# Patient Record
Sex: Female | Born: 1935 | Race: White | Hispanic: No | Smoking: Never smoker
Health system: Southern US, Community
[De-identification: ages and names within clinical notes are randomized; demographics above are authoritative.]

## PROBLEM LIST (undated history)

## (undated) DIAGNOSIS — I4891 Unspecified atrial fibrillation: Secondary | ICD-10-CM

## (undated) DIAGNOSIS — I5032 Chronic diastolic (congestive) heart failure: Secondary | ICD-10-CM

## (undated) DIAGNOSIS — H269 Unspecified cataract: Secondary | ICD-10-CM

## (undated) HISTORY — DX: Chronic diastolic (congestive) heart failure: I50.32

## (undated) HISTORY — DX: Unspecified atrial fibrillation: I48.91

## (undated) HISTORY — DX: Unspecified cataract: H26.9

---

## 2001-03-18 ENCOUNTER — Encounter: Payer: Self-pay | Admitting: Emergency Medicine

## 2001-03-18 ENCOUNTER — Emergency Department (HOSPITAL_COMMUNITY): Admission: EM | Admit: 2001-03-18 | Discharge: 2001-03-18 | Payer: Self-pay | Admitting: Emergency Medicine

## 2001-03-23 ENCOUNTER — Encounter: Payer: Self-pay | Admitting: Urology

## 2001-03-25 ENCOUNTER — Ambulatory Visit (HOSPITAL_COMMUNITY): Admission: RE | Admit: 2001-03-25 | Discharge: 2001-03-25 | Payer: Self-pay | Admitting: Urology

## 2001-03-25 ENCOUNTER — Encounter: Payer: Self-pay | Admitting: Urology

## 2001-06-07 ENCOUNTER — Other Ambulatory Visit: Admission: RE | Admit: 2001-06-07 | Discharge: 2001-06-07 | Payer: Self-pay | Admitting: Obstetrics and Gynecology

## 2002-12-22 ENCOUNTER — Other Ambulatory Visit: Admission: RE | Admit: 2002-12-22 | Discharge: 2002-12-22 | Payer: Self-pay | Admitting: *Deleted

## 2003-04-07 ENCOUNTER — Encounter: Payer: Self-pay | Admitting: *Deleted

## 2003-04-07 ENCOUNTER — Encounter: Admission: RE | Admit: 2003-04-07 | Discharge: 2003-04-07 | Payer: Self-pay | Admitting: *Deleted

## 2003-04-10 ENCOUNTER — Ambulatory Visit (HOSPITAL_BASED_OUTPATIENT_CLINIC_OR_DEPARTMENT_OTHER): Admission: RE | Admit: 2003-04-10 | Discharge: 2003-04-10 | Payer: Self-pay | Admitting: *Deleted

## 2010-08-17 ENCOUNTER — Emergency Department (HOSPITAL_COMMUNITY)
Admission: EM | Admit: 2010-08-17 | Discharge: 2010-08-17 | Payer: Self-pay | Source: Home / Self Care | Admitting: Emergency Medicine

## 2010-08-17 LAB — DIFFERENTIAL
Basophils Absolute: 0 10*3/uL (ref 0.0–0.1)
Basophils Relative: 1 % (ref 0–1)
Eosinophils Absolute: 0 10*3/uL (ref 0.0–0.7)
Eosinophils Relative: 1 % (ref 0–5)
Lymphocytes Relative: 18 % (ref 12–46)
Lymphs Abs: 1.2 10*3/uL (ref 0.7–4.0)
Monocytes Absolute: 0.4 10*3/uL (ref 0.1–1.0)
Monocytes Relative: 5 % (ref 3–12)
Neutro Abs: 5 10*3/uL (ref 1.7–7.7)
Neutrophils Relative %: 75 % (ref 43–77)

## 2010-08-17 LAB — URINALYSIS, ROUTINE W REFLEX MICROSCOPIC
Hgb urine dipstick: NEGATIVE
Specific Gravity, Urine: 1.015 (ref 1.005–1.030)
Urine Glucose, Fasting: NEGATIVE mg/dL
pH: 6 (ref 5.0–8.0)

## 2010-08-17 LAB — CBC
HCT: 44.8 % (ref 36.0–46.0)
Hemoglobin: 15 g/dL (ref 12.0–15.0)
MCH: 29.1 pg (ref 26.0–34.0)
MCHC: 33.5 g/dL (ref 30.0–36.0)
MCV: 87 fL (ref 78.0–100.0)
Platelets: 228 10*3/uL (ref 150–400)
RBC: 5.15 MIL/uL — ABNORMAL HIGH (ref 3.87–5.11)
RDW: 13.3 % (ref 11.5–15.5)
WBC: 6.7 10*3/uL (ref 4.0–10.5)

## 2010-08-17 LAB — BASIC METABOLIC PANEL
CO2: 21 mEq/L (ref 19–32)
Chloride: 108 mEq/L (ref 96–112)
GFR calc Af Amer: >60 mL/min (ref >60)
Sodium: 139 mEq/L (ref 135–145)

## 2012-05-29 DIAGNOSIS — Z23 Encounter for immunization: Secondary | ICD-10-CM | POA: Diagnosis not present

## 2012-10-27 ENCOUNTER — Inpatient Hospital Stay (HOSPITAL_COMMUNITY)
Admission: EM | Admit: 2012-10-27 | Discharge: 2012-11-02 | DRG: 308 | Disposition: A | Discharge status: Home / Self Care | Payer: Medicare Other | Attending: Cardiology | Admitting: Cardiology

## 2012-10-27 ENCOUNTER — Encounter (HOSPITAL_COMMUNITY): Payer: Self-pay | Admitting: Emergency Medicine

## 2012-10-27 ENCOUNTER — Emergency Department (HOSPITAL_COMMUNITY): Payer: Medicare Other

## 2012-10-27 ENCOUNTER — Ambulatory Visit (INDEPENDENT_AMBULATORY_CARE_PROVIDER_SITE_OTHER): Payer: Medicare Other | Admitting: Family Medicine

## 2012-10-27 VITALS — BP 141/81 | HR 168 | Temp 98.4°F | Resp 16 | Ht <= 58 in | Wt 142.0 lb

## 2012-10-27 DIAGNOSIS — R7989 Other specified abnormal findings of blood chemistry: Secondary | ICD-10-CM

## 2012-10-27 DIAGNOSIS — R42 Dizziness and giddiness: Secondary | ICD-10-CM | POA: Diagnosis not present

## 2012-10-27 DIAGNOSIS — Z7901 Long term (current) use of anticoagulants: Secondary | ICD-10-CM | POA: Diagnosis not present

## 2012-10-27 DIAGNOSIS — R404 Transient alteration of awareness: Secondary | ICD-10-CM | POA: Diagnosis not present

## 2012-10-27 DIAGNOSIS — I5032 Chronic diastolic (congestive) heart failure: Secondary | ICD-10-CM

## 2012-10-27 DIAGNOSIS — I4891 Unspecified atrial fibrillation: Secondary | ICD-10-CM

## 2012-10-27 DIAGNOSIS — I5031 Acute diastolic (congestive) heart failure: Secondary | ICD-10-CM | POA: Diagnosis not present

## 2012-10-27 DIAGNOSIS — J90 Pleural effusion, not elsewhere classified: Secondary | ICD-10-CM | POA: Diagnosis not present

## 2012-10-27 DIAGNOSIS — I359 Nonrheumatic aortic valve disorder, unspecified: Secondary | ICD-10-CM | POA: Diagnosis not present

## 2012-10-27 DIAGNOSIS — R5381 Other malaise: Secondary | ICD-10-CM | POA: Diagnosis not present

## 2012-10-27 DIAGNOSIS — E876 Hypokalemia: Secondary | ICD-10-CM | POA: Diagnosis not present

## 2012-10-27 DIAGNOSIS — I509 Heart failure, unspecified: Secondary | ICD-10-CM | POA: Diagnosis not present

## 2012-10-27 DIAGNOSIS — R0989 Other specified symptoms and signs involving the circulatory and respiratory systems: Secondary | ICD-10-CM | POA: Diagnosis not present

## 2012-10-27 LAB — COMPREHENSIVE METABOLIC PANEL
AST: 24 U/L (ref 0–37)
BUN: 17 mg/dL (ref 6–23)
CO2: 24 mEq/L (ref 19–32)
Chloride: 103 mEq/L (ref 96–112)
Creatinine, Ser: 0.98 mg/dL (ref 0.50–1.10)
GFR calc non Af Amer: 50 mL/min — ABNORMAL LOW (ref >90)
Total Bilirubin: 1.1 mg/dL (ref 0.3–1.2)

## 2012-10-27 LAB — PROTIME-INR: INR: 1.03 (ref 0.00–1.49)

## 2012-10-27 LAB — CBC
HCT: 38.4 % (ref 36.0–46.0)
Hemoglobin: 12.6 g/dL (ref 12.0–15.0)
MCV: 85.5 fL (ref 78.0–100.0)
Platelets: 215 10*3/uL (ref 150–400)
RBC: 4.49 MIL/uL (ref 3.87–5.11)
WBC: 5.9 10*3/uL (ref 4.0–10.5)

## 2012-10-27 MED ORDER — DEXTROSE 5 % IV SOLN
5.0000 mg/h | INTRAVENOUS | Status: DC
  Administered 2012-10-28 (×2): 5 mg/h via INTRAVENOUS
  Administered 2012-10-29 – 2012-10-30 (×3): 10 mg/h via INTRAVENOUS
  Filled 2012-10-27 (×5): qty 100

## 2012-10-27 MED ORDER — HEPARIN (PORCINE) IN NACL 100-0.45 UNIT/ML-% IJ SOLN
700.0000 [IU]/h | INTRAMUSCULAR | Status: DC
  Administered 2012-10-27: 750 [IU]/h via INTRAVENOUS
  Filled 2012-10-27: qty 250

## 2012-10-27 MED ORDER — WARFARIN VIDEO: Freq: Once | Status: DC

## 2012-10-27 MED ORDER — SODIUM CHLORIDE 0.9 % IV SOLN: 250.0000 mL | INTRAVENOUS | Status: DC | PRN

## 2012-10-27 MED ORDER — SODIUM CHLORIDE 0.9 % IV SOLN
1000.0000 mL | INTRAVENOUS | Status: DC
  Administered 2012-10-27: 1000 mL via INTRAVENOUS

## 2012-10-27 MED ORDER — WARFARIN - PHARMACIST DOSING INPATIENT
Freq: Every day | Status: DC
  Administered 2012-10-28 – 2012-10-29 (×2)

## 2012-10-27 MED ORDER — MAGNESIUM SULFATE IN D5W 10-5 MG/ML-% IV SOLN
1.0000 g | Freq: Once | INTRAVENOUS | Status: AC
  Administered 2012-10-28: 1 g via INTRAVENOUS
  Filled 2012-10-27: qty 100

## 2012-10-27 MED ORDER — ONDANSETRON HCL 4 MG/2ML IJ SOLN: 4.0000 mg | Freq: Four times a day (QID) | INTRAMUSCULAR | Status: DC | PRN

## 2012-10-27 MED ORDER — DILTIAZEM HCL 100 MG IV SOLR
5.0000 mg/h | INTRAVENOUS | Status: DC
  Administered 2012-10-27: 5 mg/h via INTRAVENOUS

## 2012-10-27 MED ORDER — SODIUM CHLORIDE 0.9 % IJ SOLN
3.0000 mL | Freq: Two times a day (BID) | INTRAMUSCULAR | Status: DC
  Administered 2012-10-28 – 2012-10-29 (×3): 3 mL via INTRAVENOUS
  Administered 2012-10-30: 09:00:00 via INTRAVENOUS
  Administered 2012-10-30 – 2012-11-02 (×5): 3 mL via INTRAVENOUS

## 2012-10-27 MED ORDER — HEPARIN BOLUS VIA INFUSION
2500.0000 [IU] | Freq: Once | INTRAVENOUS | Status: AC
  Administered 2012-10-27: 2500 [IU] via INTRAVENOUS

## 2012-10-27 MED ORDER — ACETAMINOPHEN 325 MG PO TABS: 650.0000 mg | ORAL_TABLET | ORAL | Status: DC | PRN

## 2012-10-27 MED ORDER — SODIUM CHLORIDE 0.9 % IJ SOLN: 3.0000 mL | INTRAMUSCULAR | Status: DC | PRN

## 2012-10-27 MED ORDER — WARFARIN SODIUM 2.5 MG PO TABS
2.5000 mg | ORAL_TABLET | Freq: Once | ORAL | Status: AC
  Administered 2012-10-28: 2.5 mg via ORAL
  Filled 2012-10-27 (×2): qty 1

## 2012-10-27 MED ORDER — FUROSEMIDE 10 MG/ML IJ SOLN
40.0000 mg | Freq: Two times a day (BID) | INTRAMUSCULAR | Status: DC
  Administered 2012-10-28 (×3): 40 mg via INTRAVENOUS
  Filled 2012-10-27 (×6): qty 4

## 2012-10-27 MED ORDER — POTASSIUM CHLORIDE CRYS ER 20 MEQ PO TBCR
40.0000 meq | EXTENDED_RELEASE_TABLET | Freq: Every day | ORAL | Status: DC
  Administered 2012-10-28 – 2012-10-31 (×4): 40 meq via ORAL
  Filled 2012-10-27 (×5): qty 2

## 2012-10-27 MED ORDER — MAGNESIUM SULFATE 50 % IJ SOLN: 1.0000 g | Freq: Once | INTRAMUSCULAR | Status: DC

## 2012-10-27 MED ORDER — COUMADIN BOOK
Freq: Once | Status: AC
  Administered 2012-10-27: 22:00:00
  Filled 2012-10-27: qty 1

## 2012-10-27 MED ORDER — ASPIRIN 81 MG PO CHEW
324.0000 mg | CHEWABLE_TABLET | Freq: Once | ORAL | Status: AC
  Administered 2012-10-27: 324 mg via ORAL
  Filled 2012-10-27: qty 4

## 2012-10-27 NOTE — H&P (Signed)
Physician History and Physical    Melanie Blake MRN: [id number] DOB/AGE: 77-Jul-1925 77 y.o. Admit date: 10/27/2012   Primary Care Physician:   Primary Cardiologist: None  HPI:  79 yo with no significant past medical history and no home medications presented to the ER with atrial fibrillation/RVR.  Patient has had 5 days of progressive weakness and dyspnea.  She has had diarrhea/lose stool though not profuse.  She vomited only once (yesterday).  No fever/chills.  She has just been very tired.  She has noted dyspnea and fatigue with relatively minor exertion such as walking down to the street and doing chores around the house.  Her niece was concerned so took her to urgent care today.  There, her HR was noted to be in the 140s in atrial fibrillation.  She was sent to the ER where diltiazem gtt was started.  HR decreased to the 100s on diltiazem gtt.  Of note, she has not felt tachypalpitations.  No chest pain.  She has been somewhat lightheaded the last few days though her BP was ok.  She lives by herself in a townhouse though her niece is near.  CXR looks like CHF.   PMH: 1. Cataracts 2. Atrial fibrillation: Newly noted.  Review of systems complete and found to be negative unless listed above   Family History:  No significant cardiac disease that she remembers.   History   Social History  . Marital Status: Single    Spouse Name: N/A    Number of Children: N/A  . Years of Education: N/A   Occupational History  . Not on file.   Social History Main Topics  . Smoking status: Never Smoker   . Smokeless tobacco: Not on file  . Alcohol Use: No  . Drug Use: No  . Sexually Active: No   Other Topics Concern  . Lives alone in a townhouse, niece lives nearby.    Social History Narrative  . No narrative on file    No home medications  Physical Exam: Blood pressure 121/89, pulse 91, temperature 98.7 F (37.1 C), temperature source Oral, resp. rate 20, SpO2 96.00%.  General:  NAD Neck: JVP 9-10 cm, no thyromegaly or thyroid nodule.  Lungs: Crackles at bases bilaterally CV: Nondisplaced PMI.  Heart irregular S1/S2, no S3/S4, no murmur. 1-2+ edema to knees bilaterally.  No carotid bruit.    Abdomen: Soft, nontender, no hepatosplenomegaly, no distention.  Skin: Intact without lesions or rashes.  Neurologic: Alert and oriented x 3.  Psych: Normal affect. Extremities: No clubbing or cyanosis.  HEENT: Normal.   Labs:   Lab Results  Component Value Date   WBC 5.9 10/27/2012   HGB 12.6 10/27/2012   HCT 38.4 10/27/2012   MCV 85.5 10/27/2012   PLT 215 10/27/2012    Recent Labs Lab 10/27/12 1947  NA 136  K 4.0  CL 103  CO2 24  BUN 17  CREATININE 0.98  CALCIUM 9.4  PROT 7.0  BILITOT 1.1  ALKPHOS 73  ALT 25  AST 24  GLUCOSE 96   Radiology: - CXR: Pulmonary vascular congestion  EKG: atrial fibrillation with RVR at 156  ASSESSMENT AND PLAN:  79 yo with no significant past medical history and no home medications presented to the ER with atrial fibrillation/RVR. 1. Atrial fibrillation: New onset atrial fibrillation with RVR, suspect this has perhaps been present over the last 4-5 days given her symptoms.  She may have started out with a viral gastroenteritis episode  though exam and labs do not suggest dehydration and the diarrhea seems to have resolved at this point.  - Control atrial fibrillation rate => diltiazem gtt titrated as needed.  - Will give 1 g MgSO4.   - Check TSH - Will start heparin/coumadin overlap.  If she remains in atrial fibrillation tomorrow, given CHF suspected to be related to the atrial fibrillation, would favor early TEE-guided DCCV.  She would then need to continue coumadin for a month, at which time she could switch over to Xarelto or apixaban.  - I would favor anticoagulation given CHADSVASC = 3 (age, gender).  She is steady on her feet with no fall or GI bleed history.  2. CHF: Acute CHF with volume overload on exam and CXR.  She has  had exertional dyspnea.  I will start lasix 40 mg IV bid for now and follow response.  She will need an echocardiogram.   Signed: Marca Ancona 10/27/2012, 9:23 PM

## 2012-10-27 NOTE — Progress Notes (Signed)
IV insertion attempt x 1 unsuccessful. Patient tolerated well.  

## 2012-10-27 NOTE — ED Provider Notes (Signed)
History    CSN: [id number] Arrival date & time 10/27/12  1906 First MD Initiated Contact with Patient 10/27/12 1907      Chief Complaint  Patient presents with  . Atrial Fibrillation    HPI The patient presents to emergency room with new onset atrial fibrillation. Patient had noticed that she been having some generalized weakness over the last week or 2. Initially she had had a few episodes of diarrhea and had attributed her symptoms to a viral illness that she was recovering from. The last day or so she noticed that when she went to walk down the stairs at her town home that she started to get short of breath, lightheaded and weak. The patient went to an urgent care today. While she was there they noted that she was in rapid atrial fibrillation patient denies any chest pain or shortness of breath. She has no prior history of this illness.   Past Medical History  Diagnosis Date  . Cataract     History reviewed. No pertinent past surgical history.  No family history on file.  History  Substance Use Topics  . Smoking status: Never Smoker   . Smokeless tobacco: Not on file  . Alcohol Use: No    OB History   Grav Para Term Preterm Abortions TAB SAB Ect Mult Living                  Review of Systems  All other systems reviewed and are negative.    Allergies  Penicillins  Home Medications  No current outpatient prescriptions on file.  BP 129/83  Temp(Src) 98.7 F (37.1 C) (Oral)  Resp 20  SpO2 95%  Physical Exam  Nursing note and vitals reviewed. Constitutional: No distress.  HENT:  Head: Normocephalic and atraumatic.  Right Ear: External ear normal.  Left Ear: External ear normal.  Eyes: Conjunctivae are normal. Right eye exhibits no discharge. Left eye exhibits no discharge. No scleral icterus.  Neck: Neck supple. No tracheal deviation present.  Cardiovascular: Intact distal pulses.  An irregularly irregular rhythm present. Tachycardia present.    Pulmonary/Chest: Effort normal and breath sounds normal. No stridor. No respiratory distress. She has no wheezes. She has no rales.  Abdominal: Soft. Bowel sounds are normal. She exhibits no distension. There is no tenderness. There is no rebound and no guarding.  Musculoskeletal: She exhibits no edema and no tenderness.  Kyphosis  Neurological: She is alert. She has normal strength. No sensory deficit. Cranial nerve deficit:  no gross defecits noted. She exhibits normal muscle tone. She displays no seizure activity. Coordination normal.  Skin: Skin is warm and dry. No rash noted.  Psychiatric: She has a normal mood and affect.    ED Course  Procedures (including critical care time) EKG Atrial fibrillation with rapid ventricular rate, rate 156 Right axis deviation Borderline T-wave abnormalities No prior EKG for comparison  Medications  0.9 %  sodium chloride infusion (1,000 mLs Intravenous New Bag/Given 10/27/12 2001)  diltiazem (CARDIZEM) 100 mg in dextrose 5 % 100 mL infusion (5 mg/hr Intravenous New Bag/Given 10/27/12 2002)  aspirin chewable tablet 324 mg (324 mg Oral Given 10/27/12 2001)   8:39 PM vital signs are much improved heart rate is now in the 90s  CRITICAL CARE Performed by: Linwood Dibbles R Total critical care time: 30 Critical care time was exclusive of separately billable procedures and treating other patients. Critical care was necessary to treat or prevent imminent or life-threatening deterioration. Critical care was  time spent personally by me on the following activities: development of treatment plan with patient and/or surrogate as well as nursing, discussions with consultants, evaluation of patient's response to treatment, examination of patient, obtaining history from patient or surrogate, ordering and performing treatments and interventions, ordering and review of laboratory studies, ordering and review of radiographic studies, pulse oximetry and re-evaluation of  patient's condition.  Labs Reviewed  COMPREHENSIVE METABOLIC PANEL - Abnormal; Notable for the following:    GFR calc non Af Amer 50 (*)    GFR calc Af Amer 58 (*)    All other components within normal limits  CBC  PROTIME-INR  APTT  POCT I-STAT TROPONIN I   Dg Chest Portable 1 View  10/27/2012  *RADIOLOGY REPORT*  Clinical Data: Atrial fibrillation, dizziness  PORTABLE CHEST - 1 VIEW  Comparison: None.  Findings: Bilateral layering pleural effusions with associated bibasilar opacities. Moderate pulmonary vascular congestion. Cardiopulmonary silhouette is partially obscured by the bibasilar processes but appears enlarged.  Atherosclerotic calcification noted in the transverse aorta.  No acute osseous abnormality.  IMPRESSION:  Cardiomegaly, pulmonary vascular congestion and bilateral layering effusions suggest underlying CHF.  Bibasilar opacities favored to reflect pleural fluid with atelectasis.  Superimposed consolidation or mass lesion is difficult to exclude on this single view.   Original Report Authenticated By: Malachy Moan, M.D.      1. Atrial fibrillation with rapid ventricular response       MDM  The patient presents with new onset atrial fibrillation with a rapid response. She has responded well to a Cardizem infusion. Patient does appear to have a component of congestive heart failure likely related to the one week of atrial fibrillation.  Consult cardiology service regarding admission for further evaluation and treatment        Celene Kras, MD 10/27/12 2045

## 2012-10-27 NOTE — ED Notes (Signed)
Pt to ED via [facility] from a urgent care with c/o new onset A-Fib.  Pt went to urgent care with c/o diarrhea and vomited x's 1 today.  Pt denies any chest pain, shortness of breath or diaphoresis.

## 2012-10-27 NOTE — Progress Notes (Signed)
ANTICOAGULATION CONSULT NOTE - Initial Consult  Pharmacy Consult for UFH/Coumadin Indication: atrial fibrillation  Allergies  Allergen Reactions  . Penicillins Swelling    Patient Measurements: Height: 4' 9.09" (145 cm) Weight: 141 lb 15.6 oz (64.4 kg) IBW/kg (Calculated) : 38.8 Heparin Dosing Weight: 54kg  Vital Signs: Temp: 98.7 F (37.1 C) (04/09 1920) Temp src: Oral (04/09 1920) BP: 121/89 mmHg (04/09 2100) Pulse Rate: 91 (04/09 2100)  Labs:  Recent Labs  10/27/12 1947  HGB 12.6  HCT 38.4  PLT 215  APTT 31  LABPROT 13.4  INR 1.03  CREATININE 0.98    Estimated Creatinine Clearance: 31.3 ml/min (by C-G formula based on Cr of 0.98).   Medical History: Past Medical History  Diagnosis Date  . Cataract     Medications:   (Not in a hospital admission)  Assessment: 79 y/o female patient admitted with new onset afib requiring anticoagulation.  Goal of Therapy:  INR 2-3 Heparin level 0.3-0.7 units/ml Monitor platelets by anticoagulation protocol: Yes   Plan:  Heparin 2500 unit IV bolus followed by infusion at 750 units/hr, coumadin 2.5mg  today, check heparin level in 8 hours, with daily cbc , heparin level and protime.  Verlene Mayer, PharmD, BCPS Pager [phone] 10/27/2012,10:08 PM

## 2012-10-27 NOTE — Progress Notes (Signed)
  Subjective:    Patient ID: Melanie Blake, female    DOB: [birth year], 79 y.o.   MRN: [id number]  Emesis  Associated symptoms include diarrhea, dizziness and headaches. Pertinent negatives include no abdominal pain, chest pain, chills, coughing or fever.  Diarrhea  Associated symptoms include headaches and vomiting. Pertinent negatives include no abdominal pain, chills, coughing or fever.  Extremity Weakness  Pertinent negatives include no fever.    Melanie Blake is a delightful 79 yo with no sig PMHx who has had diarrhea for the past 5d with progressive weakness and lightheadedness. Today she developed vomiting and her niece noticed that she seemed to be breathing shallow and fast so brought her in for further eval.  Melanie Blake has no cardiac history, takes no medications - including no otc meds or supplements - "not even an aspirin."  She has not felt up to eating and drinking much over the past few days. Yesterday, she felt so weak, she couldn't climb the stairs back to her apt after she took out her trash. No f/c. No CP or palp.  Past Medical History  Diagnosis Date  . Cataract    No current outpatient prescriptions on file prior to visit.   No current facility-administered medications on file prior to visit.   Allergies  Allergen Reactions  . Penicillins Swelling    Review of Systems  Constitutional: Positive for activity change, appetite change and fatigue. Negative for fever, chills, diaphoresis and unexpected weight change.  Respiratory: Positive for shortness of breath. Negative for cough.   Cardiovascular: Positive for leg swelling. Negative for chest pain and palpitations.  Gastrointestinal: Positive for nausea, vomiting, diarrhea and abdominal distention. Negative for abdominal pain, constipation and blood in stool.  Genitourinary: Negative for dysuria, decreased urine volume and difficulty urinating.  Musculoskeletal: Positive for extremity weakness. Negative for gait  problem.  Skin: Positive for pallor. Negative for rash and wound.  Neurological: Positive for dizziness, weakness, light-headedness and headaches.  Hematological: Negative for adenopathy. Bruises/bleeds easily.  Psychiatric/Behavioral: The patient is nervous/anxious.        BP 141/81  Pulse 168  Temp(Src) 98.4 F (36.9 C) (Oral)  Resp 16  Ht 4\' 9"  (1.448 m)  Wt 142 lb (64.411 kg)  BMI 30.72 kg/m2  SpO2 93% Objective:   Physical Exam  Constitutional: She is oriented to person, place, and time. She appears well-developed and well-nourished. She appears listless. She does not appear ill. No distress.  HENT:  Head: Normocephalic and atraumatic.  Right Ear: External ear normal.  Left Ear: External ear normal.  Eyes: Conjunctivae are normal. No scleral icterus.  Neck: Neck supple.  Cardiovascular: An irregularly irregular rhythm present. Tachycardia present.  Exam reveals decreased pulses.   Pulmonary/Chest: Effort normal. No respiratory distress. She has decreased breath sounds.  Musculoskeletal: She exhibits edema.  Neurological: She is oriented to person, place, and time. She appears listless.  Skin: Skin is warm and dry. She is not diaphoretic. No erythema.  Psychiatric: She has a normal mood and affect. Her behavior is normal.     [facility] reading (PRIMARY) by  Dr. Clelia Croft. EKG: A. Fib w/ rate of 160s  Assessment & Plan:  New onset atrial fibrillation and dehydration - IV placed by [facility] PA and transfer by EMS to [facility] for anticoag, rate control, hydration, and further eval.  Will need cardiology referral.

## 2012-10-28 DIAGNOSIS — I4891 Unspecified atrial fibrillation: Secondary | ICD-10-CM | POA: Diagnosis not present

## 2012-10-28 DIAGNOSIS — I359 Nonrheumatic aortic valve disorder, unspecified: Secondary | ICD-10-CM

## 2012-10-28 DIAGNOSIS — R7989 Other specified abnormal findings of blood chemistry: Secondary | ICD-10-CM

## 2012-10-28 DIAGNOSIS — I5032 Chronic diastolic (congestive) heart failure: Secondary | ICD-10-CM

## 2012-10-28 LAB — TROPONIN I
Troponin I: <0.3 ng/mL (ref <0.30)
Troponin I: <0.3 ng/mL (ref <0.30)
Troponin I: <0.3 ng/mL (ref <0.30)

## 2012-10-28 LAB — CBC
HCT: 39.2 % (ref 36.0–46.0)
Hemoglobin: 13 g/dL (ref 12.0–15.0)
WBC: 6.8 10*3/uL (ref 4.0–10.5)

## 2012-10-28 LAB — BASIC METABOLIC PANEL
CO2: 25 mEq/L (ref 19–32)
GFR calc non Af Amer: 50 mL/min — ABNORMAL LOW (ref >90)
Glucose, Bld: 96 mg/dL (ref 70–99)
Potassium: 3.9 mEq/L (ref 3.5–5.1)
Sodium: 138 mEq/L (ref 135–145)

## 2012-10-28 LAB — PROTIME-INR
INR: 1.07 (ref 0.00–1.49)
Prothrombin Time: 13.8 seconds (ref 11.6–15.2)

## 2012-10-28 LAB — MRSA PCR SCREENING: MRSA by PCR: NEGATIVE

## 2012-10-28 LAB — TSH: TSH: 7.811 u[IU]/mL — ABNORMAL HIGH (ref 0.350–4.500)

## 2012-10-28 MED ORDER — SODIUM CHLORIDE 0.9 % IV SOLN
INTRAVENOUS | Status: DC
  Administered 2012-11-01: 250 mL via INTRAVENOUS

## 2012-10-28 MED ORDER — WARFARIN SODIUM 2.5 MG PO TABS
2.5000 mg | ORAL_TABLET | Freq: Once | ORAL | Status: AC
  Administered 2012-10-28: 2.5 mg via ORAL
  Filled 2012-10-28: qty 1

## 2012-10-28 MED ORDER — HEPARIN (PORCINE) IN NACL 100-0.45 UNIT/ML-% IJ SOLN
550.0000 [IU]/h | INTRAMUSCULAR | Status: DC
  Administered 2012-10-29: 550 [IU]/h via INTRAVENOUS
  Filled 2012-10-28 (×6): qty 250

## 2012-10-28 NOTE — Progress Notes (Signed)
ANTICOAGULATION CONSULT NOTE - Follow Up Consult  Pharmacy Consult for heparin Indication: atrial fibrillation  Labs:  Recent Labs  10/27/12 1947 10/27/12 2351 10/28/12 0700  HGB 12.6  --  13.0  HCT 38.4  --  39.2  PLT 215  --  212  APTT 31  --   --   LABPROT 13.4  --  13.8  INR 1.03  --  1.07  HEPARINUNFRC  --   --  0.82*  CREATININE 0.98  --  0.98  TROPONINI  --  <0.30 <0.30    Assessment: 79yo female slightly supratherapeutic on heparin with initial dosing for Afib.  Goal of Therapy:  Heparin level 0.3-0.7 units/ml   Plan:  Will decrease heparin gtt by ~1 unit/kg/hr to 700 units/hr and check level in 8hr.  Vernard Gambles, PharmD, BCPS  10/28/2012,8:06 AM

## 2012-10-28 NOTE — Progress Notes (Signed)
Patient: Melanie Blake / Admit Date: 10/27/2012 / Date of Encounter: 10/28/2012, 7:09 AM   Subjective  79 yo female admitted yesterday for A. Fib with RVR. No complaints overnight. Feels SOB and lightheadedness have improved. Denies palpitations, CP. No episodes of n/v/d.    Objective   Telemetry: Afib 80-122 Physical Exam: Filed Vitals:   10/28/12 0400  BP: 122/99  Pulse: 81  Temp: 97.6 F (36.4 C)  Resp: 19   General: Well developed, well nourished, in no acute distress. Head: Normocephalic, atraumatic, sclera non-icteric, no xanthomas, nares are without discharge. Neck: Negative for carotid bruits. JVD mildly elevated. Lungs: Fine crackles heard throughout most notably in RML, RLL and LLL. Breathing is unlabored. Heart: Irregular, S1 S2 without murmurs, rubs, or gallops.  Abdomen: Soft, non-tender, non-distended with normoactive bowel sounds. No hepatomegaly. No rebound/guarding. No obvious abdominal masses. Msk:  Strength and tone appear normal for age. Extremities: No clubbing or cyanosis. Tr edema to BLEs.  Distal pedal pulses are 1+ and equal bilaterally. Scattered ecchymosis upper extremities which pt states were from the lab sticks (hard stick - has IV in hand) Neuro: Alert and oriented X 3. Moves all extremities spontaneously. Psych:  Responds to questions appropriately with a normal affect.   No intake or output data in the 24 hours ending 10/28/12 0709  Inpatient Medications:  . furosemide  40 mg Intravenous BID  . potassium chloride  40 mEq Oral Daily  . sodium chloride  3 mL Intravenous Q12H  . warfarin   Does not apply Once  . Warfarin - Pharmacist Dosing Inpatient   Does not apply q1800    Labs:  Recent Labs  10/27/12 1947  NA 136  K 4.0  CL 103  CO2 24  GLUCOSE 96  BUN 17  CREATININE 0.98  CALCIUM 9.4    Recent Labs  10/27/12 1947  AST 24  ALT 25  ALKPHOS 73  BILITOT 1.1  PROT 7.0  ALBUMIN 4.0    Recent Labs  10/27/12 1947  WBC  5.9  HGB 12.6  HCT 38.4  MCV 85.5  PLT 215    Recent Labs  10/27/12 2351  TROPONINI <0.30   Radiology/Studies:  Dg Chest Portable 1 View 10/27/2012  *RADIOLOGY REPORT*  Clinical Data: Atrial fibrillation, dizziness  PORTABLE CHEST - 1 VIEW  Comparison: None.  Findings: Bilateral layering pleural effusions with associated bibasilar opacities. Moderate pulmonary vascular congestion. Cardiopulmonary silhouette is partially obscured by the bibasilar processes but appears enlarged.  Atherosclerotic calcification noted in the transverse aorta.  No acute osseous abnormality.  IMPRESSION:  Cardiomegaly, pulmonary vascular congestion and bilateral layering effusions suggest underlying CHF.  Bibasilar opacities favored to reflect pleural fluid with atelectasis.  Superimposed consolidation or mass lesion is difficult to exclude on this single view.   Original Report Authenticated By: Malachy Moan, M.D.      Assessment and Plan  1. A. Fib: Remains on Dilt gtt at 5mg /hr with adequate HR control. Continue heparin gtt and bridge to long term Coumadin as her CHADSVASc score is 4 (age>75, female, CHF). Concern was stated regarding frequency of lab draws with Coumadin therapy-she would benefit from home health for both CHF and INR draws (can arrange at discharge, CM consult ordered). As she remains in A.fib, consider TEE/DCCV today, she has been kept NPO. Follow up TSH and Magnesium levels.   2. CHF: Suspect this is related to new onset A.Fib. BNP elevated yesterday to 1532. Crackles remain present upon exam. Down 3lbs  today, continue 40mg  Lasix BID and follow potassium levels . ECHO today.   Signed, Ronie Spies PA-C      Patient examined and agree except changes made. Echo pending but suspect normal LV function.  Valera Castle, MD 10/28/2012 10:49 AM

## 2012-10-28 NOTE — Progress Notes (Addendum)
D/w Dr. Daleen Squibb. No room on schedule for TEE/DCCV today. Will allow her to diurese some, keep on IV dilt, ok to eat, and make NPO in AM for possible TEE/DCCV tomorrow if still in afib.  Addendum: Trish aware to scheduled procedure, tentatively on schedule for [facility]. Vita Currin PA-C

## 2012-10-28 NOTE — Progress Notes (Signed)
  Echocardiogram 2D Echocardiogram has been performed.  Melanie Blake 10/28/2012, 11:12 AM

## 2012-10-28 NOTE — Progress Notes (Signed)
ANTICOAGULATION CONSULT NOTE - Follow Up Consult  Pharmacy Consult for warfarin Indication: atrial fibrillation  Allergies  Allergen Reactions  . Penicillins Swelling    Patient Measurements: Height: 5' (152.4 cm) Weight: 142 lb 13.7 oz (64.8 kg) IBW/kg (Calculated) : 45.5 Heparin Dosing Weight: 54kg  Vital Signs: Temp: 98 F (36.7 C) (04/10 0750) Temp src: Oral (04/10 0750) BP: 116/81 mmHg (04/10 0900) Pulse Rate: 99 (04/10 0900)  Labs:  Recent Labs  10/27/12 1947 10/27/12 2351 10/28/12 0700  HGB 12.6  --  13.0  HCT 38.4  --  39.2  PLT 215  --  212  APTT 31  --   --   LABPROT 13.4  --  13.8  INR 1.03  --  1.07  HEPARINUNFRC  --   --  0.82*  CREATININE 0.98  --  0.98  TROPONINI  --  <0.30 <0.30    Estimated Creatinine Clearance: 34 ml/min (by C-G formula based on Cr of 0.98).   Medications:  Infusions:  . sodium chloride 1,000 mL (10/27/12 2001)  . diltiazem (CARDIZEM) infusion 5 mg/hr (10/28/12 0700)  . heparin 700 Units/hr (10/28/12 0819)  . [DISCONTINUED] diltiazem (CARDIZEM) infusion 5 mg/hr (10/27/12 2002)    Assessment: 89 yof continues on IV heparin and coumadin for new onset afib. INR today remains subtherapeutic as expected after 1 dose of coumadin. Her CBC remains stable and WNL. No bleeding noted. Heparin was adjusted earlier this AM.   Goal of Therapy:  INR 2-3   Plan:  1. Repeat coumadin 2.5mg  PO x 1 tonight 2. F/u AM INR 3. F/u 1600 heparin level  Louana Fontenot, Drake Leach 10/28/2012,11:15 AM

## 2012-10-28 NOTE — Progress Notes (Signed)
Discussed TEE/DCCV in detail with the patient and her niece. She is still hesitant regarding the procedure, and would like to think about it overnight. She has not consented. Advised this can re-addressed tomorrow with rounding MD. Will keep spot for TEE/DCCV tomorrow and keep NPO in the meantime.   Jacqulyn Bath, PA-C 10/28/2012 6:53 PM

## 2012-10-28 NOTE — Care Management Note (Addendum)
    Page 1 of 1   11/01/2012     1:59:46 PM   CARE MANAGEMENT NOTE 11/01/2012  Patient:  Melanie Blake, Melanie Blake   Account Number:  [id number]  Date Initiated:  10/28/2012  Documentation initiated by:  Alvira Philips Assessment:   79 yr-old female adm with AFib/CHF; lives alone, niece lives nearby      DC Planning Services  CM consult      Choice offered to / List presented to:  C-1 Patient       HH arranged  HH-1 RN  HH-10 DISEASE MANAGEMENT      HH agency  [facility]   Status of service:  Completed, signed off Per UR Regulation:  Reviewed for med. necessity/level of care/duration of stay  Comments:  10/28/12 0914 Loghan Subia RN BSN MSN CCM Pt reports niece works but provides transportation to appts, other assistance as she can - neighbors are also supportive.  Discussed home health nursing services, pt agrees to f/u, Provided list of agencies, referral made per choice.

## 2012-10-29 ENCOUNTER — Encounter (HOSPITAL_COMMUNITY)
Admission: EM | Disposition: A | Discharge status: Home / Self Care | Payer: Self-pay | Source: Home / Self Care | Attending: Cardiology

## 2012-10-29 ENCOUNTER — Encounter (HOSPITAL_COMMUNITY): Payer: Self-pay | Admitting: Certified Registered Nurse Anesthetist

## 2012-10-29 ENCOUNTER — Encounter (HOSPITAL_COMMUNITY): Payer: Self-pay | Admitting: Gastroenterology

## 2012-10-29 DIAGNOSIS — I509 Heart failure, unspecified: Secondary | ICD-10-CM | POA: Diagnosis not present

## 2012-10-29 DIAGNOSIS — I5031 Acute diastolic (congestive) heart failure: Secondary | ICD-10-CM | POA: Diagnosis not present

## 2012-10-29 DIAGNOSIS — I4891 Unspecified atrial fibrillation: Secondary | ICD-10-CM | POA: Diagnosis not present

## 2012-10-29 DIAGNOSIS — R7989 Other specified abnormal findings of blood chemistry: Secondary | ICD-10-CM

## 2012-10-29 LAB — CBC
HCT: 37.9 % (ref 36.0–46.0)
MCH: 28.5 pg (ref 26.0–34.0)
MCHC: 33.8 g/dL (ref 30.0–36.0)
MCV: 84.4 fL (ref 78.0–100.0)
RDW: 14.7 % (ref 11.5–15.5)

## 2012-10-29 LAB — BASIC METABOLIC PANEL
BUN: 19 mg/dL (ref 6–23)
CO2: 28 mEq/L (ref 19–32)
Chloride: 101 mEq/L (ref 96–112)
Creatinine, Ser: 1.22 mg/dL — ABNORMAL HIGH (ref 0.50–1.10)

## 2012-10-29 SURGERY — CANCELLED PROCEDURE

## 2012-10-29 MED ORDER — WARFARIN SODIUM 2.5 MG PO TABS
2.5000 mg | ORAL_TABLET | Freq: Once | ORAL | Status: AC
  Administered 2012-10-29: 2.5 mg via ORAL
  Filled 2012-10-29: qty 1

## 2012-10-29 MED ORDER — FENTANYL CITRATE 0.05 MG/ML IJ SOLN
INTRAMUSCULAR | Status: DC | PRN
  Administered 2012-10-29 (×2): 25 ug via INTRAVENOUS

## 2012-10-29 MED ORDER — MIDAZOLAM HCL 5 MG/ML IJ SOLN
INTRAMUSCULAR | Status: AC
  Filled 2012-10-29: qty 2

## 2012-10-29 MED ORDER — MIDAZOLAM HCL 10 MG/2ML IJ SOLN
INTRAMUSCULAR | Status: DC | PRN
  Administered 2012-10-29: 1 mg via INTRAVENOUS
  Administered 2012-10-29: 2 mg via INTRAVENOUS
  Administered 2012-10-29: 1 mg via INTRAVENOUS

## 2012-10-29 MED ORDER — FUROSEMIDE 40 MG PO TABS
40.0000 mg | ORAL_TABLET | Freq: Two times a day (BID) | ORAL | Status: DC
  Administered 2012-10-29 – 2012-10-31 (×6): 40 mg via ORAL
  Filled 2012-10-29 (×10): qty 1

## 2012-10-29 MED ORDER — FENTANYL CITRATE 0.05 MG/ML IJ SOLN
INTRAMUSCULAR | Status: AC
  Filled 2012-10-29: qty 2

## 2012-10-29 MED ORDER — WARFARIN VIDEO
Freq: Once | Status: AC
  Administered 2012-10-30: 12:00:00

## 2012-10-29 MED ORDER — BUTAMBEN-TETRACAINE-BENZOCAINE 2-2-14 % EX AERO
INHALATION_SPRAY | CUTANEOUS | Status: DC | PRN
  Administered 2012-10-29: 2 via TOPICAL

## 2012-10-29 NOTE — Progress Notes (Signed)
ANTICOAGULATION CONSULT NOTE - Follow Up Consult  Pharmacy Consult for Heparin/Warfarin Indication: atrial fibrillation  Allergies  Allergen Reactions  . Penicillins Swelling    Patient Measurements: Height: 5' (152.4 cm) Weight: 142 lb 10.2 oz (64.7 kg) IBW/kg (Calculated) : 45.5 Heparin Dosing Weight: 54 kg  Vital Signs: Temp: 97.5 F (36.4 C) (04/11 0738) Temp src: Oral (04/11 0738) BP: 119/87 mmHg (04/11 0738) Pulse Rate: 86 (04/11 0738)  Labs:  Recent Labs  10/27/12 1947 10/27/12 2351  10/28/12 0700 10/28/12 1606 10/28/12 1607 10/29/12 0042 10/29/12 0922  HGB 12.6  --   --  13.0  --   --  12.8  --   HCT 38.4  --   --  39.2  --   --  37.9  --   PLT 215  --   --  212  --   --  213  --   APTT 31  --   --   --   --   --   --   --   LABPROT 13.4  --   --  13.8  --   --  14.6  --   INR 1.03  --   --  1.07  --   --  1.16  --   HEPARINUNFRC  --   --   < > 0.82* 0.91*  --  0.58 0.49  CREATININE 0.98  --   --  0.98  --   --  1.22*  --   TROPONINI  --  <0.30  --  <0.30  --  <0.30  --   --   < > = values in this interval not displayed.  Estimated Creatinine Clearance: 27.3 ml/min (by C-G formula based on Cr of 1.22).   Medications:  Infusions:  . sodium chloride 1,000 mL (10/27/12 2001)  . sodium chloride    . diltiazem (CARDIZEM) infusion 5 mg/hr (10/29/12 0355)  . heparin 550 Units/hr (10/29/12 0928)  . [DISCONTINUED] heparin Stopped (10/28/12 1709)    Assessment: 79 year old female receiving Heparin bridging to Coumadin for atrial fibrillation.  Her INR is responding to her first 2 doses of Coumadin, and her heparin level is therapeutic.  Goal of Therapy:  INR 2-3 Heparin level 0.3-0.7 units/ml Monitor platelets by anticoagulation protocol: Yes   Plan:  Continue Heparin at 550 units/hr Continue with Coumadin 2.5mg  PO today Check AM Heparin level, CBC, PT/INR  Estella Husk, Pharm.D., BCPS Clinical Pharmacist Phone: [phone] or [phone] Pager:  [phone] 10/29/2012, 11:07 AM

## 2012-10-29 NOTE — Interval H&P Note (Signed)
History and Physical Interval Note:  10/29/2012 3:07 PM  Melanie Blake  has presented today for surgery, with the diagnosis of a-fib  The various methods of treatment have been discussed with the patient and family. After consideration of risks, benefits and other options for treatment, the patient has consented to  Procedure(s): TRANSESOPHAGEAL ECHOCARDIOGRAM (TEE) (N/A) CARDIOVERSION (N/A) as a surgical intervention .  The patient's history has been reviewed, patient examined, no change in status, stable for surgery.  I have reviewed the patient's chart and labs.  Questions were answered to the patient's satisfaction.     Jago Carton [employer]

## 2012-10-29 NOTE — Progress Notes (Signed)
Pt having intermittent confusion. Asking "where am I, why am I here."  MD Shirlee Latch notified and confusion attributed to Versed 5mg  given for TEE procedure. Will cont to monitor pt. Kael Keetch L

## 2012-10-29 NOTE — Progress Notes (Signed)
TELEMETRY: Reviewed telemetry pt in afib with controlled rate.: Filed Vitals:   10/28/12 1745 10/28/12 1900 10/29/12 0006 10/29/12 0400  BP:  120/69 112/72 110/69  Pulse: 69   54  Temp:  98 F (36.7 C) 97.9 F (36.6 C) 97.9 F (36.6 C)  TempSrc:  Oral Oral Oral  Resp: 16 19 17 11   Height:      Weight:    142 lb 10.2 oz (64.7 kg)  SpO2: 93%   95%    Intake/Output Summary (Last 24 hours) at 10/29/12 0727 Last data filed at 10/28/12 1745  Gross per 24 hour  Intake 184.96 ml  Output      0 ml  Net 184.96 ml    SUBJECTIVE Feels well. Still tired but denies SOB or chest pain. No diarrhea.  LABS: Basic Metabolic Panel:  Recent Labs  16/10/96 1947 10/28/12 0700 10/29/12 0042  NA 136 138 138  K 4.0 3.9 3.4*  CL 103 103 101  CO2 24 25 28   GLUCOSE 96 96 86  BUN 17 16 19   CREATININE 0.98 0.98 1.22*  CALCIUM 9.4 9.4 9.2  MG  --  2.5  --    Liver Function Tests:  Recent Labs  10/27/12 1947  AST 24  ALT 25  ALKPHOS 73  BILITOT 1.1  PROT 7.0  ALBUMIN 4.0    CBC:  Recent Labs  10/28/12 0700 10/29/12 0042  WBC 6.8 6.5  HGB 13.0 12.8  HCT 39.2 37.9  MCV 84.8 84.4  PLT 212 213   Cardiac Enzymes:  Recent Labs  10/27/12 2351 10/28/12 0700 10/28/12 1607  TROPONINI <0.30 <0.30 <0.30   Thyroid Function Tests:  Recent Labs  10/28/12 0006  TSH 7.811*   Radiology/Studies:  Dg Chest Portable 1 View  10/27/2012  *RADIOLOGY REPORT*  Clinical Data: Atrial fibrillation, dizziness  PORTABLE CHEST - 1 VIEW  Comparison: None.  Findings: Bilateral layering pleural effusions with associated bibasilar opacities. Moderate pulmonary vascular congestion. Cardiopulmonary silhouette is partially obscured by the bibasilar processes but appears enlarged.  Atherosclerotic calcification noted in the transverse aorta.  No acute osseous abnormality.  IMPRESSION:  Cardiomegaly, pulmonary vascular congestion and bilateral layering effusions suggest underlying CHF.  Bibasilar  opacities favored to reflect pleural fluid with atelectasis.  Superimposed consolidation or mass lesion is difficult to exclude on this single view.   Original Report Authenticated By: Malachy Moan, M.D.     PHYSICAL EXAM General: elderly, well nourished, in no acute distress. Head: Normocephalic, atraumatic, sclera non-icteric, no xanthomas, nares are without discharge. Neck: Negative for carotid bruits. JVD  Elevated 8 cm. Lungs: bibasilar crackles Heart: IRRR S1 S2 without murmurs, rubs, or gallops.  Abdomen: Soft, non-tender, non-distended with normoactive bowel sounds. No hepatomegaly.  Extremities: 1+ edema.  Distal pedal pulses are 2+ and equal bilaterally. Neuro: Alert and oriented X 3. Moves all extremities spontaneously. Psych:  Responds to questions appropriately with a normal affect.  ASSESSMENT AND PLAN: 1. Atrial fibrillation. Rate controlled on IV diltiazem. On IV heparin. Initiating coumadin. INR still low. Plan TEE/DCCV today. Reviewed procedure and indication with patient and she is agreeable to proceed.  2. Acute diastolic CHF exacerbated by afib. Will switch lasix to po today.   3. Elevated TSH- will repeat and check free T4.   4. Hypokalemia. Replete orally.  Principal Problem:   Atrial fibrillation with RVR Active Problems:   Acute diastolic CHF (congestive heart failure)   Elevated TSH    Signed, Peter [place] MD,FACC  10/29/2012 7:33 AM

## 2012-10-29 NOTE — CV Procedure (Signed)
Procedure: TEE attempted  Sedation: Versed 4 mg IV, Fentanyl 50 mcg IV  Despite multiple attempts, I was unable to pass the probe into the esophagus.   Plan rate control and anticoagulation.  Could cardiovert without TEE after 1 month of therapeutic anticoagulation.   No complications.   Marca Ancona 10/29/2012 3:30 PM

## 2012-10-29 NOTE — H&P (View-Only) (Signed)
 TELEMETRY: Reviewed telemetry pt in afib with controlled rate.: Filed Vitals:   10/28/12 1745 10/28/12 1900 10/29/12 0006 10/29/12 0400  BP:  120/69 112/72 110/69  Pulse: 69   54  Temp:  98 F (36.7 C) 97.9 F (36.6 C) 97.9 F (36.6 C)  TempSrc:  Oral Oral Oral  Resp: 16 19 17 11  Height:      Weight:    142 lb 10.2 oz (64.7 kg)  SpO2: 93%   95%    Intake/Output Summary (Last 24 hours) at 10/29/12 0727 Last data filed at 10/28/12 1745  Gross per 24 hour  Intake 184.96 ml  Output      0 ml  Net 184.96 ml    SUBJECTIVE Feels well. Still tired but denies SOB or chest pain. No diarrhea.  LABS: Basic Metabolic Panel:  Recent Labs  10/27/12 1947 10/28/12 0700 10/29/12 0042  NA 136 138 138  K 4.0 3.9 3.4*  CL 103 103 101  CO2 24 25 28  GLUCOSE 96 96 86  BUN 17 16 19  CREATININE 0.98 0.98 1.22*  CALCIUM 9.4 9.4 9.2  MG  --  2.5  --    Liver Function Tests:  Recent Labs  10/27/12 1947  AST 24  ALT 25  ALKPHOS 73  BILITOT 1.1  PROT 7.0  ALBUMIN 4.0    CBC:  Recent Labs  10/28/12 0700 10/29/12 0042  WBC 6.8 6.5  HGB 13.0 12.8  HCT 39.2 37.9  MCV 84.8 84.4  PLT 212 213   Cardiac Enzymes:  Recent Labs  10/27/12 2351 10/28/12 0700 10/28/12 1607  TROPONINI <0.30 <0.30 <0.30   Thyroid Function Tests:  Recent Labs  10/28/12 0006  TSH 7.811*   Radiology/Studies:  Dg Chest Portable 1 View  10/27/2012  *RADIOLOGY REPORT*  Clinical Data: Atrial fibrillation, dizziness  PORTABLE CHEST - 1 VIEW  Comparison: None.  Findings: Bilateral layering pleural effusions with associated bibasilar opacities. Moderate pulmonary vascular congestion. Cardiopulmonary silhouette is partially obscured by the bibasilar processes but appears enlarged.  Atherosclerotic calcification noted in the transverse aorta.  No acute osseous abnormality.  IMPRESSION:  Cardiomegaly, pulmonary vascular congestion and bilateral layering effusions suggest underlying CHF.  Bibasilar  opacities favored to reflect pleural fluid with atelectasis.  Superimposed consolidation or mass lesion is difficult to exclude on this single view.   Original Report Authenticated By: Heath McCullough, M.D.     PHYSICAL EXAM General: elderly, well nourished, in no acute distress. Head: Normocephalic, atraumatic, sclera non-icteric, no xanthomas, nares are without discharge. Neck: Negative for carotid bruits. JVD  Elevated 8 cm. Lungs: bibasilar crackles Heart: IRRR S1 S2 without murmurs, rubs, or gallops.  Abdomen: Soft, non-tender, non-distended with normoactive bowel sounds. No hepatomegaly.  Extremities: 1+ edema.  Distal pedal pulses are 2+ and equal bilaterally. Neuro: Alert and oriented X 3. Moves all extremities spontaneously. Psych:  Responds to questions appropriately with a normal affect.  ASSESSMENT AND PLAN: 1. Atrial fibrillation. Rate controlled on IV diltiazem. On IV heparin. Initiating coumadin. INR still low. Plan TEE/DCCV today. Reviewed procedure and indication with patient and she is agreeable to proceed.  2. Acute diastolic CHF exacerbated by afib. Will switch lasix to po today.   3. Elevated TSH- will repeat and check free T4.   4. Hypokalemia. Replete orally.  Principal Problem:   Atrial fibrillation with RVR Active Problems:   Acute diastolic CHF (congestive heart failure)   Elevated TSH    Signed, Wilmer Berryhill MD,FACC   10/29/2012 7:33 AM    

## 2012-10-29 NOTE — Anesthesia Preprocedure Evaluation (Deleted)
Anesthesia Evaluation  Patient identified by MRN, date of birth, ID band Patient awake    Reviewed: Allergy & Precautions, H&P , NPO status , Patient's Chart, lab work & pertinent test results, reviewed documented beta blocker date and time   Airway Mallampati: II TM Distance: >3 FB Neck ROM: full    Dental   Pulmonary neg pulmonary ROS,  breath sounds clear to auscultation        Cardiovascular +CHF + dysrhythmias Atrial Fibrillation Rhythm:regular     Neuro/Psych negative neurological ROS  negative psych ROS   GI/Hepatic negative GI ROS, Neg liver ROS,   Endo/Other  negative endocrine ROS  Renal/GU Renal disease  negative genitourinary   Musculoskeletal   Abdominal   Peds  Hematology negative hematology ROS (+)   Anesthesia Other Findings See surgeon's H&P   Reproductive/Obstetrics negative OB ROS                           Anesthesia Physical Anesthesia Plan  ASA: III  Anesthesia Plan: General   Post-op Pain Management:    Induction: Intravenous  Airway Management Planned: Mask  Additional Equipment:   Intra-op Plan:   Post-operative Plan:   Informed Consent: I have reviewed the patients History and Physical, chart, labs and discussed the procedure including the risks, benefits and alternatives for the proposed anesthesia with the patient or authorized representative who has indicated his/her understanding and acceptance.   Dental Advisory Given  Plan Discussed with: CRNA and Surgeon  Anesthesia Plan Comments:         Anesthesia Quick Evaluation

## 2012-10-29 NOTE — Progress Notes (Signed)
ANTICOAGULATION CONSULT NOTE - Follow Up Consult  Pharmacy Consult for heparin Indication: atrial fibrillation  Labs:  Recent Labs  10/27/12 1947 10/27/12 2351 10/28/12 0700 10/28/12 1606 10/28/12 1607 10/29/12 0042  HGB 12.6  --  13.0  --   --  12.8  HCT 38.4  --  39.2  --   --  37.9  PLT 215  --  212  --   --  213  APTT 31  --   --   --   --   --   LABPROT 13.4  --  13.8  --   --  14.6  INR 1.03  --  1.07  --   --  1.16  HEPARINUNFRC  --   --  0.82* 0.91*  --  0.58  CREATININE 0.98  --  0.98  --   --   --   TROPONINI  --  <0.30 <0.30  --  <0.30  --     Assessment/Plan:  79yo female now therapeutic on heparin after rate decreases.  Will continue gtt at current rate and confirm stable with additional level.  Vernard Gambles, PharmD, BCPS  10/29/2012,1:26 AM

## 2012-10-30 DIAGNOSIS — I4891 Unspecified atrial fibrillation: Secondary | ICD-10-CM | POA: Diagnosis not present

## 2012-10-30 LAB — HEPARIN LEVEL (UNFRACTIONATED): Heparin Unfractionated: 0.5 IU/mL (ref 0.30–0.70)

## 2012-10-30 LAB — BASIC METABOLIC PANEL
BUN: 16 mg/dL (ref 6–23)
GFR calc Af Amer: 44 mL/min — ABNORMAL LOW (ref >90)
GFR calc non Af Amer: 38 mL/min — ABNORMAL LOW (ref >90)
Potassium: 3.9 mEq/L (ref 3.5–5.1)

## 2012-10-30 LAB — CBC
Hemoglobin: 13.3 g/dL (ref 12.0–15.0)
MCHC: 33.3 g/dL (ref 30.0–36.0)
RDW: 14.7 % (ref 11.5–15.5)
WBC: 6.2 10*3/uL (ref 4.0–10.5)

## 2012-10-30 LAB — PROTIME-INR
INR: 1.31 (ref 0.00–1.49)
Prothrombin Time: 16 seconds — ABNORMAL HIGH (ref 11.6–15.2)

## 2012-10-30 MED ORDER — DILTIAZEM HCL 30 MG PO TABS
30.0000 mg | ORAL_TABLET | Freq: Four times a day (QID) | ORAL | Status: DC
  Administered 2012-10-30 – 2012-10-31 (×3): 30 mg via ORAL
  Filled 2012-10-30 (×8): qty 1

## 2012-10-30 MED ORDER — WARFARIN SODIUM 5 MG PO TABS
5.0000 mg | ORAL_TABLET | Freq: Once | ORAL | Status: AC
  Administered 2012-10-30: 5 mg via ORAL
  Filled 2012-10-30: qty 1

## 2012-10-30 NOTE — Progress Notes (Signed)
   Subjective:  Patient failed attempt at TEE yesterday.  Scope could not be advanced into esophagus. On IV heparin and coumadin loading.  No chest pain or increased dyspnea. On IV diltiazem.  EF normal by TTE  Objective:  Vital Signs in the last 24 hours: Temp:  [97.2 F (36.2 C)-98.3 F (36.8 C)] 98.3 F (36.8 C) (04/12 1200) Pulse Rate:  [30-106] 30 (04/12 0500) Resp:  [13-71] 18 (04/12 0500) BP: (100-163)/(45-112) 117/72 mmHg (04/12 1200) SpO2:  [88 %-99 %] 94 % (04/12 0800) Weight:  [142 lb 6.7 oz (64.6 kg)] 142 lb 6.7 oz (64.6 kg) (04/12 0500)  Intake/Output from previous day: 04/11 0701 - 04/12 0700 In: 319 [P.O.:60; I.V.:259] Out: 1525 [Urine:1525] Intake/Output from this shift: Total I/O In: 317.5 [P.O.:240; I.V.:77.5] Out: 100 [Urine:100]  . furosemide  40 mg Oral BID  . potassium chloride  40 mEq Oral Daily  . sodium chloride  3 mL Intravenous Q12H  . Warfarin - Pharmacist Dosing Inpatient   Does not apply q1800   . sodium chloride 1,000 mL (10/27/12 2001)  . sodium chloride    . diltiazem (CARDIZEM) infusion 10 mg/hr (10/30/12 0535)  . heparin 550 Units/hr (10/29/12 4782)    Physical Exam: The patient appears to be in no distress.  Head and neck exam reveals that the pupils are equal and reactive.  The extraocular movements are full.  There is no scleral icterus.  Mouth and pharynx are benign.  No lymphadenopathy.  No carotid bruits.  The jugular venous pressure is normal.  Thyroid is not enlarged or tender.  Chest is clear to percussion and auscultation.  No rales or rhonchi.  Expansion of the chest is symmetrical. Kyphosis present.  Heart reveals no abnormal lift or heave.  First and second heart sounds are normal.  There is no murmur gallop rub or click. Pulse irregular.  The abdomen is soft and nontender.  Bowel sounds are normoactive.  There is no hepatosplenomegaly or mass.  There are no abdominal bruits.  Extremities reveal mild  edema.  Neurologic exam is normal strength and no lateralizing weakness.  No sensory deficits.  Integument reveals no rash  Lab Results:  Recent Labs  10/29/12 0042 10/30/12 0630  WBC 6.5 6.2  HGB 12.8 13.3  PLT 213 216    Recent Labs  10/29/12 0042 10/30/12 0630  NA 138 138  K 3.4* 3.9  CL 101 98  CO2 28 26  GLUCOSE 86 74  BUN 19 16  CREATININE 1.22* 1.23*    Recent Labs  10/28/12 0700 10/28/12 1607  TROPONINI <0.30 <0.30   Hepatic Function Panel  Recent Labs  10/27/12 1947  PROT 7.0  ALBUMIN 4.0  AST 24  ALT 25  ALKPHOS 73  BILITOT 1.1   No results found for this basename: CHOL,  in the last 72 hours No results found for this basename: PROTIME,  in the last 72 hours  Imaging: Imaging results have been reviewed  Cardiac Studies: Telemetry shows atrial fib with controlled VR Assessment/Plan:  1. Atrial fibrillation. Rate controlled on IV diltiazem. On IV heparin. Initiating coumadin. INR still low.    2. Acute diastolic CHF exacerbated by afib.  On  lasix   3. Elevated TSH- free T4 normal. 4. Hypokalemia. Now normal K    Plan: switch to oral diltiazem today.  Continue warfarin loading.   LOS: 3 days    Cassell Clement 10/30/2012, 12:37 PM

## 2012-10-30 NOTE — Progress Notes (Signed)
ANTICOAGULATION CONSULT NOTE - Follow Up Consult  Pharmacy Consult for Heparin/Warfarin Indication: atrial fibrillation  Allergies  Allergen Reactions  . Penicillins Swelling    Patient Measurements: Height: 5' (152.4 cm) Weight: 142 lb 6.7 oz (64.6 kg) IBW/kg (Calculated) : 45.5 Heparin Dosing Weight: 54 kg  Vital Signs: Temp: 98.3 F (36.8 C) (04/12 1200) Temp src: Axillary (04/12 1200) BP: 117/72 mmHg (04/12 1200) Pulse Rate: 30 (04/12 0500)  Labs:  Recent Labs  10/27/12 1947 10/27/12 2351 10/28/12 0700  10/28/12 1607 10/29/12 0042 10/29/12 0922 10/30/12 0630  HGB 12.6  --  13.0  --   --  12.8  --  13.3  HCT 38.4  --  39.2  --   --  37.9  --  39.9  PLT 215  --  212  --   --  213  --  216  APTT 31  --   --   --   --   --   --   --   LABPROT 13.4  --  13.8  --   --  14.6  --  16.0*  INR 1.03  --  1.07  --   --  1.16  --  1.31  HEPARINUNFRC  --   --  0.82*  < >  --  0.58 0.49 0.50  CREATININE 0.98  --  0.98  --   --  1.22*  --  1.23*  TROPONINI  --  <0.30 <0.30  --  <0.30  --   --   --   < > = values in this interval not displayed.  Estimated Creatinine Clearance: 27 ml/min (by C-G formula based on Cr of 1.23).   Medications:  Infusions:  . sodium chloride 1,000 mL (10/27/12 2001)  . sodium chloride    . heparin 550 Units/hr (10/29/12 0928)  . [DISCONTINUED] diltiazem (CARDIZEM) infusion 10 mg/hr (10/30/12 0535)    Assessment: 79 year old female receiving Heparin bridging to Coumadin for atrial fibrillation.  Her INR is responding to her first 3 doses of Coumadin, and her heparin level is therapeutic.  No bleeding or complications noted.  Coumadin education completed today with patient and family.  Goal of Therapy:  INR 2-3 Heparin level 0.3-0.7 units/ml Monitor platelets by anticoagulation protocol: Yes   Plan:  Continue Heparin at 550 units/hr Coumadin 5 mg po x 1 Check AM Heparin level, CBC, PT/INR  Tad Moore, BCPS  Clinical  Pharmacist Pager [phone]  10/30/2012 1:22 PM

## 2012-10-30 NOTE — Progress Notes (Signed)
Pt A X 4 today. OOB in chair throughout shift. No c/o pain . Excellent appetite. Coumadin education/video shared with pt and niece.

## 2012-10-31 DIAGNOSIS — I4891 Unspecified atrial fibrillation: Secondary | ICD-10-CM | POA: Diagnosis not present

## 2012-10-31 LAB — PROTIME-INR
INR: 1.84 — ABNORMAL HIGH (ref 0.00–1.49)
Prothrombin Time: 20.6 seconds — ABNORMAL HIGH (ref 11.6–15.2)

## 2012-10-31 LAB — BASIC METABOLIC PANEL
BUN: 26 mg/dL — ABNORMAL HIGH (ref 6–23)
Creatinine, Ser: 1.44 mg/dL — ABNORMAL HIGH (ref 0.50–1.10)
GFR calc Af Amer: 37 mL/min — ABNORMAL LOW (ref >90)
GFR calc non Af Amer: 32 mL/min — ABNORMAL LOW (ref >90)
Potassium: 4.2 mEq/L (ref 3.5–5.1)

## 2012-10-31 LAB — CBC
MCHC: 33.8 g/dL (ref 30.0–36.0)
RDW: 14.4 % (ref 11.5–15.5)

## 2012-10-31 MED ORDER — DILTIAZEM HCL ER COATED BEADS 120 MG PO CP24
120.0000 mg | ORAL_CAPSULE | Freq: Every day | ORAL | Status: DC
  Administered 2012-10-31: 120 mg via ORAL
  Filled 2012-10-31 (×2): qty 1

## 2012-10-31 MED ORDER — WARFARIN SODIUM 2.5 MG PO TABS
2.5000 mg | ORAL_TABLET | Freq: Once | ORAL | Status: AC
  Administered 2012-10-31: 2.5 mg via ORAL
  Filled 2012-10-31: qty 1

## 2012-10-31 NOTE — Progress Notes (Addendum)
ANTICOAGULATION CONSULT NOTE - Follow Up Consult  Pharmacy Consult for Heparin/Warfarin Indication: atrial fibrillation  Allergies  Allergen Reactions  . Penicillins Swelling    Patient Measurements: Height: 5' (152.4 cm) Weight: 137 lb 12.6 oz (62.5 kg) IBW/kg (Calculated) : 45.5 Heparin Dosing Weight: 54 kg  Vital Signs: Temp: 98.5 F (36.9 C) (04/13 1120) Temp src: Oral (04/13 1120) BP: 119/71 mmHg (04/13 1120) Pulse Rate: 115 (04/13 1120)  Labs:  Recent Labs  10/28/12 1607  10/29/12 0042 10/29/12 0922 10/30/12 0630 10/31/12 0723  HGB  --   < > 12.8  --  13.3 13.4  HCT  --   --  37.9  --  39.9 39.6  PLT  --   --  213  --  216 207  LABPROT  --   --  14.6  --  16.0* 20.6*  INR  --   --  1.16  --  1.31 1.84*  HEPARINUNFRC  --   --  0.58 0.49 0.50 0.34  CREATININE  --   --  1.22*  --  1.23* 1.44*  TROPONINI <0.30  --   --   --   --   --   < > = values in this interval not displayed.  Estimated Creatinine Clearance: 22.7 ml/min (by C-G formula based on Cr of 1.44).   Medications:  Infusions:  . sodium chloride 1,000 mL (10/27/12 2001)  . sodium chloride    . heparin 550 Units/hr (10/29/12 1610)    Assessment: 79 year old female receiving Heparin bridging to Coumadin for atrial fibrillation.  Her INR is responding to her first 4 doses of Coumadin, and her heparin level is therapeutic.  No bleeding or complications noted.  Coumadin education completed 4/12 with patient and family.  Goal of Therapy:  INR 2-3 Heparin level 0.3-0.7 units/ml Monitor platelets by anticoagulation protocol: Yes   Plan:  Continue Heparin at 550 units/hr Coumadin 2.5 mg po x 1 Check AM Heparin level, CBC, PT/INR  Tad Moore, BCPS  Clinical Pharmacist Pager [phone]  10/31/2012 1:21 PM

## 2012-10-31 NOTE — Progress Notes (Signed)
Subjective:  Patient failed attempt at TEE Friday.  Scope could not be advanced into esophagus. On IV heparin and coumadin loading.  No chest pain or increased dyspnea. She was switched to oral diltiazem yesterday.  Blood pressure remaining stable.  EF normal by TTE  Objective:  Vital Signs in the last 24 hours: Temp:  [97.7 F (36.5 C)-98.6 F (37 C)] 98.1 F (36.7 C) (04/13 0738) Pulse Rate:  [75-97] 97 (04/13 0738) Resp:  [15-24] 15 (04/13 0738) BP: (109-125)/(55-72) 116/66 mmHg (04/13 0738) SpO2:  [90 %-96 %] 90 % (04/13 0738) Weight:  [137 lb 12.6 oz (62.5 kg)] 137 lb 12.6 oz (62.5 kg) (04/13 0457)  Intake/Output from previous day: 04/12 0701 - 04/13 0700 In: 880 [P.O.:720; I.V.:160] Out: 900 [Urine:900] Intake/Output from this shift:    . diltiazem  30 mg Oral Q6H  . furosemide  40 mg Oral BID  . potassium chloride  40 mEq Oral Daily  . sodium chloride  3 mL Intravenous Q12H  . Warfarin - Pharmacist Dosing Inpatient   Does not apply q1800   . sodium chloride 1,000 mL (10/27/12 2001)  . sodium chloride    . heparin 550 Units/hr (10/29/12 7846)    Physical Exam: The patient appears to be in no distress.  Head and neck exam reveals that the pupils are equal and reactive.  The extraocular movements are full.  There is no scleral icterus.  Mouth and pharynx are benign.  No lymphadenopathy.  No carotid bruits.  The jugular venous pressure is normal.  Thyroid is not enlarged or tender.  Chest is clear to percussion and auscultation.  No rales or rhonchi.  Expansion of the chest is symmetrical. Kyphosis present.  Heart reveals no abnormal lift or heave.  First and second heart sounds are normal.  There is no murmur gallop rub or click. Pulse irregular.  The abdomen is soft and nontender.  Bowel sounds are normoactive.  There is no hepatosplenomegaly or mass.  There are no abdominal bruits.  Extremities reveal mild edema.  She has a blood blister on the anterior aspect  of the right upper leg.  She does not recall any trauma.  Neurologic exam is normal strength and no lateralizing weakness.  No sensory deficits.  Integument reveals no rash  Lab Results:  Recent Labs  10/30/12 0630 10/31/12 0723  WBC 6.2 6.8  HGB 13.3 13.4  PLT 216 207    Recent Labs  10/30/12 0630 10/31/12 0723  NA 138 136  K 3.9 4.2  CL 98 98  CO2 26 27  GLUCOSE 74 89  BUN 16 26*  CREATININE 1.23* 1.44*    Recent Labs  10/28/12 1607  TROPONINI <0.30   Hepatic Function Panel No results found for this basename: PROT, ALBUMIN, AST, ALT, ALKPHOS, BILITOT, BILIDIR, IBILI,  in the last 72 hours No results found for this basename: CHOL,  in the last 72 hours No results found for this basename: PROTIME,  in the last 72 hours  Imaging: Imaging results have been reviewed  Cardiac Studies: Telemetry shows atrial fib with controlled VR Assessment/Plan:  1. Atrial fibrillation. Rate controlled Cardizem On IV heparin. Initiating coumadin. INR still subtherapeutic but improving. 2. Acute diastolic CHF exacerbated by afib.  On  lasix   3. Elevated TSH- free T4 normal. 4. Hypokalemia. Now normal K    Plan: switch to long-acting diltiazem today.  Continue warfarin loading.  Anticipate possibly home tomorrow if INR therapeutic.   LOS:  4 days    Cassell Clement 10/31/2012, 10:22 AM

## 2012-11-01 DIAGNOSIS — I509 Heart failure, unspecified: Secondary | ICD-10-CM | POA: Diagnosis not present

## 2012-11-01 DIAGNOSIS — I5031 Acute diastolic (congestive) heart failure: Secondary | ICD-10-CM | POA: Diagnosis not present

## 2012-11-01 DIAGNOSIS — I4891 Unspecified atrial fibrillation: Secondary | ICD-10-CM | POA: Diagnosis not present

## 2012-11-01 LAB — BASIC METABOLIC PANEL
BUN: 32 mg/dL — ABNORMAL HIGH (ref 6–23)
Calcium: 9.6 mg/dL (ref 8.4–10.5)
Creatinine, Ser: 1.3 mg/dL — ABNORMAL HIGH (ref 0.50–1.10)
GFR calc Af Amer: 42 mL/min — ABNORMAL LOW (ref >90)
GFR calc non Af Amer: 36 mL/min — ABNORMAL LOW (ref >90)

## 2012-11-01 LAB — PROTIME-INR
INR: 2.03 — ABNORMAL HIGH (ref 0.00–1.49)
Prothrombin Time: 22.1 seconds — ABNORMAL HIGH (ref 11.6–15.2)

## 2012-11-01 LAB — CBC
MCHC: 34.2 g/dL (ref 30.0–36.0)
RDW: 14.6 % (ref 11.5–15.5)

## 2012-11-01 MED ORDER — POTASSIUM CHLORIDE CRYS ER 20 MEQ PO TBCR
20.0000 meq | EXTENDED_RELEASE_TABLET | Freq: Every day | ORAL | Status: DC
  Administered 2012-11-01 – 2012-11-02 (×2): 20 meq via ORAL
  Filled 2012-11-01 (×3): qty 1

## 2012-11-01 MED ORDER — FUROSEMIDE 40 MG PO TABS
40.0000 mg | ORAL_TABLET | Freq: Every day | ORAL | Status: DC
  Administered 2012-11-01 – 2012-11-02 (×2): 40 mg via ORAL
  Filled 2012-11-01 (×2): qty 1

## 2012-11-01 MED ORDER — METOPROLOL SUCCINATE ER 25 MG PO TB24
25.0000 mg | ORAL_TABLET | Freq: Every day | ORAL | Status: DC
  Administered 2012-11-01 – 2012-11-02 (×2): 25 mg via ORAL
  Filled 2012-11-01 (×2): qty 1

## 2012-11-01 MED ORDER — DILTIAZEM HCL ER COATED BEADS 240 MG PO CP24
240.0000 mg | ORAL_CAPSULE | Freq: Every day | ORAL | Status: DC
  Administered 2012-11-01 – 2012-11-02 (×2): 240 mg via ORAL
  Filled 2012-11-01 (×2): qty 1

## 2012-11-01 MED ORDER — WARFARIN SODIUM 4 MG PO TABS
4.0000 mg | ORAL_TABLET | Freq: Once | ORAL | Status: AC
  Administered 2012-11-01: 4 mg via ORAL
  Filled 2012-11-01: qty 1

## 2012-11-01 NOTE — Progress Notes (Signed)
Patient ID: Melanie Blake, female   DOB: 08-19-1923, 79 y.o.   MRN: [id number]    SUBJECTIVE: No complaints this morning.  HR mildly elevated in the 100s range.   . diltiazem  240 mg Oral Daily  . furosemide  40 mg Oral Daily  . metoprolol succinate  25 mg Oral Daily  . potassium chloride  20 mEq Oral Daily  . sodium chloride  3 mL Intravenous Q12H  . Warfarin - Pharmacist Dosing Inpatient   Does not apply q1800      Filed Vitals:   10/31/12 2017 10/31/12 2100 10/31/12 2352 11/01/12 0405  BP:  118/68 127/70 98/69  Pulse: 115  106 104  Temp: 98.1 F (36.7 C)  98.1 F (36.7 C) 98.1 F (36.7 C)  TempSrc: Oral  Oral Oral  Resp: 17  19 10   Height:      Weight:    134 lb 4.2 oz (60.9 kg)  SpO2: 96%  93% 99%    Intake/Output Summary (Last 24 hours) at 11/01/12 0738 Last data filed at 11/01/12 0600  Gross per 24 hour  Intake    852 ml  Output   2325 ml  Net  -1473 ml    LABS: Basic Metabolic Panel:  Recent Labs  40/98/11 0723 11/01/12 0505  NA 136 137  K 4.2 4.2  CL 98 99  CO2 27 27  GLUCOSE 89 86  BUN 26* 32*  CREATININE 1.44* 1.30*  CALCIUM 9.3 9.6   Liver Function Tests: No results found for this basename: AST, ALT, ALKPHOS, BILITOT, PROT, ALBUMIN,  in the last 72 hours No results found for this basename: LIPASE, AMYLASE,  in the last 72 hours CBC:  Recent Labs  10/31/12 0723 11/01/12 0505  WBC 6.8 PENDING  HGB 13.4 13.6  HCT 39.6 39.8  MCV 84.3 84.1  PLT 207 235   Cardiac Enzymes: No results found for this basename: CKTOTAL, CKMB, CKMBINDEX, TROPONINI,  in the last 72 hours BNP: No components found with this basename: POCBNP,  D-Dimer: No results found for this basename: DDIMER,  in the last 72 hours Hemoglobin A1C: No results found for this basename: HGBA1C,  in the last 72 hours Fasting Lipid Panel: No results found for this basename: CHOL, HDL, LDLCALC, TRIG, CHOLHDL, LDLDIRECT,  in the last 72 hours Thyroid Function Tests:  Recent  Labs  10/29/12 0922  TSH 6.920*   Anemia Panel: No results found for this basename: VITAMINB12, FOLATE, FERRITIN, TIBC, IRON, RETICCTPCT,  in the last 72 hours  RADIOLOGY: Dg Chest Portable 1 View  10/27/2012  *RADIOLOGY REPORT*  Clinical Data: Atrial fibrillation, dizziness  PORTABLE CHEST - 1 VIEW  Comparison: None.  Findings: Bilateral layering pleural effusions with associated bibasilar opacities. Moderate pulmonary vascular congestion. Cardiopulmonary silhouette is partially obscured by the bibasilar processes but appears enlarged.  Atherosclerotic calcification noted in the transverse aorta.  No acute osseous abnormality.  IMPRESSION:  Cardiomegaly, pulmonary vascular congestion and bilateral layering effusions suggest underlying CHF.  Bibasilar opacities favored to reflect pleural fluid with atelectasis.  Superimposed consolidation or mass lesion is difficult to exclude on this single view.   Original Report Authenticated By: Malachy Moan, M.D.     PHYSICAL EXAM General: NAD Neck: No JVD, no thyromegaly or thyroid nodule.  Lungs: Clear to auscultation bilaterally with normal respiratory effort. CV: Nondisplaced PMI.  Heart irregular S1/S2, no S3/S4.  2/6 murmur along sternal border.  Trace ankle edema.  No carotid bruit.  Normal pedal  pulses.  Abdomen: Soft, nontender, no hepatosplenomegaly, no distention.  Neurologic: Alert and oriented x 3.  Psych: Normal affect. Extremities: No clubbing or cyanosis.   TELEMETRY: Reviewed telemetry pt in atrial fibrillation, rate 100s  ASSESSMENT AND PLAN: 79 yo with minimal PMH admitted with atrial fibrillation/RVR and acute diastolic CHF. 1. Atrial fibrillation: Coumadin therapeutic, can stop heparin gtt.  HR still elevated at rest.  - Increase diltiazem CD to 240 mg daily.  - Add Toprol XL 25 mg daily.  2. Acute diastolic CHF: Volume improved, weight down 7 lbs overall.  Decrease Lasix to 40 mg once daily.  3. Will keep today to control  HR better and to mobilize.  Can go to telemetry.  PT evaluation for home safety (lives alone) and care management involvement to get her a home health nurse.   Marca Ancona 11/01/2012 7:42 AM

## 2012-11-01 NOTE — Progress Notes (Signed)
ANTICOAGULATION CONSULT NOTE - Follow Up Consult  Pharmacy Consult for Warfarin Indication: atrial fibrillation  Allergies  Allergen Reactions  . Penicillins Swelling    Patient Measurements: Height: 5' (152.4 cm) Weight: 132 lb 6.4 oz (60.056 kg) (scale B) IBW/kg (Calculated) : 45.5 Heparin Dosing Weight: 54 kg  Vital Signs: Temp: 97.7 F (36.5 C) (04/14 1046) Temp src: Oral (04/14 1046) BP: 141/74 mmHg (04/14 1046) Pulse Rate: 111 (04/14 1046)  Labs:  Recent Labs  10/30/12 0630 10/31/12 0723 11/01/12 0505  HGB 13.3 13.4 13.6  HCT 39.9 39.6 39.8  PLT 216 207 235  LABPROT 16.0* 20.6* 22.1*  INR 1.31 1.84* 2.03*  HEPARINUNFRC 0.50 0.34 0.30  CREATININE 1.23* 1.44* 1.30*    Estimated Creatinine Clearance: 24.7 ml/min (by C-G formula based on Cr of 1.3).   Medications:  Infusions:  . sodium chloride 1,000 mL (10/27/12 2001)  . sodium chloride 250 mL (11/01/12 0819)  . [DISCONTINUED] heparin 550 Units/hr (10/29/12 9811)    Assessment: 79 year old female receiving Coumadin for new atrial fibrillation.  Her INR today is therapeutic at 2.03.  Was also receiving IV heparin bridge until discontinued by Dr. Shirlee Latch this AM due to therapeutic INR.   CBC stable.  No bleeding or complications noted. No significant drug interaction with coumadin noted.  Coumadin education completed 4/12 with patient and family.  Goal of Therapy:  INR 2-3 Monitor platelets by anticoagulation protocol: Yes   Plan:  Coumadin 4 mg po x 1 Daily PT/INR  Noah Delaine, RPh Clinical Pharmacist Pager: [phone]  11/01/2012 11:09 AM

## 2012-11-01 NOTE — Evaluation (Signed)
Physical Therapy Evaluation Patient Details Name: Melanie Blake MRN: [id number] DOB: 27-Aug-1923 Today's Date: 11/01/2012 Time: 1207-1218 PT Time Calculation (min): 11 min  PT Assessment / Plan / Recommendation Clinical Impression  Pt adm with A-fib.  Expect pt can return to her own home when medically ready.  Will see acutely for PT to maximize I and safety so pt can return to her home but don't think she will need follow-up PT.    PT Assessment  Patient needs continued PT services    Follow Up Recommendations  No PT follow up    Does the patient have the potential to tolerate intense rehabilitation      Barriers to Discharge        Equipment Recommendations  None recommended by PT    Recommendations for Other Services     Frequency Min 3X/week    Precautions / Restrictions Precautions Precautions: None   Pertinent Vitals/Pain RHR 101. EHR 130's with amb.      Mobility  Bed Mobility Bed Mobility: Supine to Sit;Sitting - Scoot to Edge of Bed Supine to Sit: 6: Modified independent (Device/Increase time);HOB elevated Sitting - Scoot to Edge of Bed: 6: Modified independent (Device/Increase time) Transfers Transfers: Sit to Stand;Stand to Sit Sit to Stand: 5: Supervision;With upper extremity assist;From bed;From toilet Stand to Sit: 5: Supervision;With upper extremity assist;To toilet;To chair/3-in-1;With armrests Ambulation/Gait Ambulation/Gait Assistance: 5: Supervision Ambulation Distance (Feet): 135 Feet Assistive device: None Ambulation/Gait Assistance Details: Slightly unsteady initially but no loss of balance.  Improved with incr distance. Gait Pattern: Wide base of support;Step-through pattern;Decreased stride length General Gait Details: Legs externally rotated.    Exercises     PT Diagnosis: Difficulty walking  PT Problem List: Decreased balance;Decreased mobility PT Treatment Interventions: Gait training;Balance training;Functional mobility  training;Therapeutic activities;Patient/family education   PT Goals Acute Rehab PT Goals PT Goal Formulation: With patient Time For Goal Achievement: 11/08/12 Potential to Achieve Goals: Good Pt will go Sit to Stand: with modified independence PT Goal: Sit to Stand - Progress: Goal set today Pt will go Stand to Sit: with modified independence PT Goal: Stand to Sit - Progress: Goal set today Pt will Ambulate: 51 - 150 feet;with modified independence PT Goal: Ambulate - Progress: Goal set today  Visit Information  Last PT Received On: 11/01/12 Assistance Needed: +1    Subjective Data  Subjective: Pt states she is usually independent. Patient Stated Goal: Return home.   Prior Functioning  Home Living Lives With: Alone Available Help at Discharge: Family;Available PRN/intermittently (Niece drives her.) Type of Home: House (condo) Home Access: Stairs to enter Secretary/administrator of Steps: 3 Entrance Stairs-Rails: Right Home Layout: One level Bathroom Shower/Tub: Health visitor: Standard Home Adaptive Equipment: None Prior Function Level of Independence: Independent Able to Take Stairs?: Yes Driving: No Vocation: Retired Musician: No difficulties    Copywriter, advertising Overall Cognitive Status: Appears within functional limits for tasks assessed/performed Arousal/Alertness: Awake/alert Orientation Level: Appears intact for tasks assessed Behavior During Session: [facility] for tasks performed    Extremity/Trunk Assessment Right Lower Extremity Assessment RLE ROM/Strength/Tone: [facility] for tasks assessed Left Lower Extremity Assessment LLE ROM/Strength/Tone: [facility] for tasks assessed   Balance Balance Balance Assessed: Yes Static Standing Balance Static Standing - Balance Support: No upper extremity supported Static Standing - Level of Assistance: 6: Modified independent (Device/Increase time)  End of Session PT - End of Session Activity  Tolerance: Patient tolerated treatment well;Other (comment) (Amb limited due to incr HR) Patient left:  in chair;with call bell/phone within reach Nurse Communication: Mobility status  GP     [facility] 11/01/2012, 2:20 PM  [facility] PT [phone]

## 2012-11-01 NOTE — Plan of Care (Signed)
Problem: Phase I Progression Outcomes Goal: Up in chair, BRP Outcome: Completed/Met Date Met:  11/01/12 Pt oob to chair with assist x 1, tolerates well

## 2012-11-02 ENCOUNTER — Other Ambulatory Visit: Payer: Self-pay | Admitting: Nurse Practitioner

## 2012-11-02 DIAGNOSIS — I509 Heart failure, unspecified: Secondary | ICD-10-CM | POA: Diagnosis not present

## 2012-11-02 DIAGNOSIS — I5031 Acute diastolic (congestive) heart failure: Secondary | ICD-10-CM | POA: Diagnosis not present

## 2012-11-02 DIAGNOSIS — I4891 Unspecified atrial fibrillation: Secondary | ICD-10-CM | POA: Diagnosis not present

## 2012-11-02 LAB — BASIC METABOLIC PANEL
BUN: 34 mg/dL — ABNORMAL HIGH (ref 6–23)
Chloride: 102 mEq/L (ref 96–112)
Creatinine, Ser: 1.3 mg/dL — ABNORMAL HIGH (ref 0.50–1.10)
GFR calc Af Amer: 42 mL/min — ABNORMAL LOW (ref >90)

## 2012-11-02 LAB — CBC
Hemoglobin: 14.3 g/dL (ref 12.0–15.0)
MCH: 28.5 pg (ref 26.0–34.0)
MCHC: 33.5 g/dL (ref 30.0–36.0)
Platelets: 212 10*3/uL (ref 150–400)

## 2012-11-02 LAB — PROTIME-INR: Prothrombin Time: 22.9 seconds — ABNORMAL HIGH (ref 11.6–15.2)

## 2012-11-02 MED ORDER — FUROSEMIDE 40 MG PO TABS: 40.0000 mg | ORAL_TABLET | Freq: Every day | ORAL | Status: DC

## 2012-11-02 MED ORDER — DILTIAZEM HCL ER COATED BEADS 240 MG PO CP24: 240.0000 mg | ORAL_CAPSULE | Freq: Every day | ORAL | Status: DC

## 2012-11-02 MED ORDER — POTASSIUM CHLORIDE ER 10 MEQ PO TBCR: 10.0000 meq | EXTENDED_RELEASE_TABLET | Freq: Every day | ORAL | Status: DC

## 2012-11-02 MED ORDER — WARFARIN SODIUM 5 MG PO TABS: 5.0000 mg | ORAL_TABLET | Freq: Every day | ORAL | Status: DC

## 2012-11-02 MED ORDER — WARFARIN SODIUM 4 MG PO TABS
4.0000 mg | ORAL_TABLET | Freq: Once | ORAL | Status: DC
  Filled 2012-11-02: qty 1

## 2012-11-02 MED ORDER — METOPROLOL SUCCINATE ER 25 MG PO TB24: 25.0000 mg | ORAL_TABLET | Freq: Every day | ORAL | Status: DC

## 2012-11-02 NOTE — Discharge Summary (Signed)
CARDIOLOGY DISCHARGE SUMMARY   Patient ID: Melanie Blake MRN: [id number] DOB/AGE: 1923/11/07 79 y.o.  Admit date: 10/27/2012 Discharge date: 11/04/2012  Primary Discharge Diagnosis:   Atrial fibrillation with RVR  Secondary Discharge Diagnosis:    Acute diastolic CHF (congestive heart failure)   Elevated TSH  Procedures:  2-D echocardiogram, transesophageal echocardiogram (unsuccessful)  Hospital Course: PRESTON WEILL is a 79 y.o. female with no history of CAD or arrhythmia. She had progressive weakness and dyspnea and finally went to urgent care where she was in atrial fibrillation with rapid ventricular response. She was transferred to [facility] Her chest x-ray showed heart failure. She was admitted for further evaluation and treatment.  She was started on diltiazem for rate control. She was started on heparin for anti-coagulation with transition to Coumadin. Because of the heart failure, she was started on IV Lasix.  With the IV Lasix, she lost 10 pounds during her hospital stay and her discharge weight is 131 pounds. Once her respiratory status improved, she was transitioned back to by mouth medications and is to continue both daily weights and diuretics as an outpatient.  Once her respiratory status improved, she was felt stable for a TEE and cardioversion. This was scheduled for 10/29/2012. Despite multiple attempts, Dr. Shirlee Latch was unable to pass the probe into the esophagus. Therefore, no cardioversion was attempted. Rate control was recommended and cardioversion after 1 month of therapeutic anticoagulation.  She had been started on heparin and transitioned to Coumadin. By 11/01/2012, her INR was therapeutic and she will continue Coumadin as an outpatient. Her TSH was slightly elevated but her free T4 was within normal limits. This will be followed as an outpatient. Her cardiac enzymes were cycled and were negative for MI. She had a minor wall motion abnormality on her  echocardiogram. Her EF is preserved and she has no history of chest pain. No further ischemic evaluation was pursued.  Since she had been in the hospital for several days, she was feeling a little weak. Physical therapy saw her and noted her to be slightly unsteady with ambulation but felt she was overall moving well and no physical therapy followup was recommended. The case manager saw her and home health services are recommended. She is to have a home health RN and a referral was made.  Rate control for her atrial fibrillation was continually pursued. The Cardizem was changed to oral and the dose was increased as needed for better heart rate control. A beta blocker was also added to her medication regimen.  By 11/02/2012,  Ms Reliford was greatly improved. She was evaluated by Dr. Shirlee Latch and considered stable for discharge, to follow up as an outpatient.  Labs:   Lab Results  Component Value Date   WBC 8.1 11/02/2012   HGB 14.3 11/02/2012   HCT 42.7 11/02/2012   MCV 85.1 11/02/2012   PLT 212 11/02/2012     Recent Labs Lab 11/02/12 0543  NA 139  K 4.4  CL 102  CO2 28  BUN 34*  CREATININE 1.30*  CALCIUM 9.9  GLUCOSE 87    Pro B Natriuretic peptide (BNP)  Date/Time Value Range Status  10/27/2012 11:51 PM 1532.0* 0 - 450 pg/mL Final    Recent Labs  11/02/12 0543  INR 2.13*   Lab Results  Component Value Date   Free T4 1.65 10/29/2012   TSH 6.920* 10/29/2012      Radiology: Dg Chest Portable 1 View 10/27/2012  *RADIOLOGY REPORT*  Clinical Data:  Atrial fibrillation, dizziness  PORTABLE CHEST - 1 VIEW  Comparison: None.  Findings: Bilateral layering pleural effusions with associated bibasilar opacities. Moderate pulmonary vascular congestion. Cardiopulmonary silhouette is partially obscured by the bibasilar processes but appears enlarged.  Atherosclerotic calcification noted in the transverse aorta.  No acute osseous abnormality.  IMPRESSION:  Cardiomegaly, pulmonary vascular  congestion and bilateral layering effusions suggest underlying CHF.  Bibasilar opacities favored to reflect pleural fluid with atelectasis.  Superimposed consolidation or mass lesion is difficult to exclude on this single view.   Original Report Authenticated By: Malachy Moan, M.D.    EKG: 27-Oct-2012 19:18:21  ATRIAL FIBRILLATION WITH RAPID V-RATE ~ A-rate>240, V-rate>(180-age) , new since last tracing RIGHT AXIS DEVIATION ~ QRS axis ( 91,269) ABNRM R PROG, CONSIDER ASMI OR LEAD PLACEMENT ~ Q >2mS, diminished R, V2 BORDERLINE T ABNORMALITIES, INFERIOR LEADS ~ T flat/neg, II III aVF , new since last tracing 36mm/s 18mm/mV 150Hz  8.0.1 12SL 235 CID: [id number] Referred by: Confirmed By: Linwood Dibbles MD-J Vent. rate 156 BPM PR interval * ms QRS duration 86 ms QT/QTc 280/451 ms P-R-T axes * 127 -21  Echo: 10/28/2012 Conclusions - Left ventricle: LVEF Is grossly normal with basal inferior hypokinesis. The cavity size was normal. Wall thickness was normal. - Aortic valve: Mild regurgitation. - Mitral valve: Mild regurgitation. - Right ventricle: Systolic function was mildly reduced. - Pulmonary arteries: PA peak pressure: 40mm Hg (S).  FOLLOW UP PLANS AND APPOINTMENTS Allergies  Allergen Reactions  . Penicillins Swelling     Medication List    TAKE these medications       diltiazem 240 MG 24 hr capsule  Commonly known as:  CARDIZEM CD  Take 1 capsule (240 mg total) by mouth daily.     furosemide 40 MG tablet  Commonly known as:  LASIX  Take 1 tablet (40 mg total) by mouth daily.     metoprolol succinate 25 MG 24 hr tablet  Commonly known as:  TOPROL XL  Take 1 tablet (25 mg total) by mouth daily.     potassium chloride 10 MEQ tablet  Commonly known as:  K-DUR  Take 1 tablet (10 mEq total) by mouth daily.     warfarin 5 MG tablet  Commonly known as:  COUMADIN  Take 1 tablet (5 mg total) by mouth daily. Start with 5 mg/2.5 mg/2.5 mg.  5 mg 4/15, 2.5 mg 4/16 and 4/17 and  continue this pattern.        Discharge Orders   Future Appointments Provider Department Dept Phone   11/10/2012 1:45 PM [facility] [employer] [employer] Main Office [place]) [phone]   11/10/2012 2:00 PM Laurey Morale, MD [facility] Main Office [place]) [phone]   Future Orders Complete By Expires     (HEART FAILURE PATIENTS) Call MD:  Anytime you have any of the following symptoms: 1) 3 pound weight gain in 24 hours or 5 pounds in 1 week 2) shortness of breath, with or without a dry hacking cough 3) swelling in the hands, feet or stomach 4) if you have to sleep on extra pillows at night in order to breathe.  As directed     Avoid straining  As directed     Contraindication to ACEI at discharge  As directed     Scheduling Instructions:      EF IS NORMAL    Diet - low sodium heart healthy  As directed     Face-to-face encounter (required for Medicare/Medicaid patients)  As directed  Scheduling Instructions:      INR this Friday, 4/18. BMET on Monday 4/21. Results to [facility], Dr Shirlee Latch, fax [phone]    Comments:      I Theodore Demark certify that this patient is under my care and that I, or a nurse practitioner or physician's assistant working with me, had a face-to-face encounter that meets the physician face-to-face encounter requirements with this patient on 11/02/2012. The encounter with the patient was in whole, or in part for the following medical condition(s) which is the primary reason for home health care (List medical condition): CHF    Questions:      The encounter with the patient was in whole, or in part, for the following medical condition, which is the primary reason for home health care:  CHF    I certify that, based on my findings, the following services are medically necessary home health services:  Nursing    My clinical findings support the need for the above services:  Shortness of breath with activity    Further, I certify that my clinical  findings support that this patient is homebound due to:  Leaving home exacerbates symptoms (dyspnea, pain, anxiety, etc)    Reason for Medically Necessary Home Health Services:  Skilled Nursing- Changes in Medication/Medication Management    Skilled Nursing- Teaching of Disease Process/Symptom Management    Heart Failure patients record your daily weight using the same scale at the same time of day  As directed     Home Health  As directed     Questions:      To provide the following care/treatments:  RN    Increase activity slowly  As directed     STOP any activity that causes chest pain, shortness of breath, dizziness, sweating, or exessive weakness  As directed       Follow-up Information   Follow up with Marca Ancona, MD On 11/10/2012. (Lab work at 1:45 pm, MD appt at 2:00 pm)    Contact information:   [address] [place] [address] [phone]       BRING ALL MEDICATIONS WITH YOU TO FOLLOW UP APPOINTMENTS  Time spent with patient to include physician time: 43 min Signed: Theodore Demark, PA-C 11/04/2012, 12:53 PM Co-Sign MD

## 2012-11-02 NOTE — Progress Notes (Signed)
ANTICOAGULATION CONSULT NOTE - Follow Up Consult  Pharmacy Consult for Warfarin Indication: atrial fibrillation  Allergies  Allergen Reactions  . Penicillins Swelling    Patient Measurements: Height: 5' (152.4 cm) Weight: 131 lb (59.421 kg) (scale b) IBW/kg (Calculated) : 45.5 Heparin Dosing Weight: 54 kg  Vital Signs: Temp: 98 F (36.7 C) (04/15 0522) Temp src: Oral (04/15 0522) BP: 122/75 mmHg (04/15 0522) Pulse Rate: 89 (04/15 0522)  Labs:  Recent Labs  10/31/12 0723 11/01/12 0505 11/02/12 0543  HGB 13.4 13.6 14.3  HCT 39.6 39.8 42.7  PLT 207 235 212  LABPROT 20.6* 22.1* 22.9*  INR 1.84* 2.03* 2.13*  HEPARINUNFRC 0.34 0.30  --   CREATININE 1.44* 1.30* 1.30*    Estimated Creatinine Clearance: 24.6 ml/min (by C-G formula based on Cr of 1.3).   Medications:  Infusions:  . sodium chloride 1,000 mL (10/27/12 2001)  . [DISCONTINUED] sodium chloride 250 mL (11/01/12 0819)    Assessment: 79 year old female receiving Coumadin for new atrial fibrillation.  Her INR today is therapeutic at 2.13.    CBC stable. No bleeding or complications noted. No significant drug interaction with coumadin noted. Cardiologist noted patient had no complaints today and HR 80-90s atrial fibrillation with reasonable rate control.  Dr. Shirlee Latch noted plan to leave on coumadin for now since already started on this . After 4 wks therapeutic anticoagulation Dr. Shirlee Latch recommends plan for  DCCV. After cardioversion and 1 month post-DCCV of coumadin, he plans to switch to Xarelto so she does not have to drive for INRs.   Coumadin education completed 4/12 with patient and family.  Goal of Therapy:  INR 2-3 Monitor platelets by anticoagulation protocol: Yes   Plan:  Coumadin 4 mg po x 1 Daily PT/INR  Noah Delaine, RPh Clinical Pharmacist Pager: [phone]  11/02/2012 11:33 AM

## 2012-11-02 NOTE — Progress Notes (Signed)
Patient ID: Melanie Blake, female   DOB: 1924-03-10, 79 y.o.   MRN: [id number]    SUBJECTIVE: No complaints this morning.  HR 80s-90s atrial fibrillation.   Marland Kitchen diltiazem  240 mg Oral Daily  . furosemide  40 mg Oral Daily  . metoprolol succinate  25 mg Oral Daily  . potassium chloride  20 mEq Oral Daily  . sodium chloride  3 mL Intravenous Q12H  . Warfarin - Pharmacist Dosing Inpatient   Does not apply q1800      Filed Vitals:   11/01/12 1458 11/01/12 1950 11/02/12 0108 11/02/12 0522  BP: 93/58  100/58 122/75  Pulse: 98 82 94 89  Temp: 98.3 F (36.8 C) 97.9 F (36.6 C)  98 F (36.7 C)  TempSrc: Oral Oral  Oral  Resp: 18 18 16 16   Height:      Weight:    131 lb (59.421 kg)  SpO2: 98% 97% 94% 98%    Intake/Output Summary (Last 24 hours) at 11/02/12 1012 Last data filed at 11/02/12 0800  Gross per 24 hour  Intake    420 ml  Output    850 ml  Net   -430 ml    LABS: Basic Metabolic Panel:  Recent Labs  52/84/13 0505 11/02/12 0543  NA 137 139  K 4.2 4.4  CL 99 102  CO2 27 28  GLUCOSE 86 87  BUN 32* 34*  CREATININE 1.30* 1.30*  CALCIUM 9.6 9.9   Liver Function Tests: No results found for this basename: AST, ALT, ALKPHOS, BILITOT, PROT, ALBUMIN,  in the last 72 hours No results found for this basename: LIPASE, AMYLASE,  in the last 72 hours CBC:  Recent Labs  11/01/12 0505 11/02/12 0543  WBC 7.5 8.1  HGB 13.6 14.3  HCT 39.8 42.7  MCV 84.1 85.1  PLT 235 212   Cardiac Enzymes: No results found for this basename: CKTOTAL, CKMB, CKMBINDEX, TROPONINI,  in the last 72 hours BNP: No components found with this basename: POCBNP,  D-Dimer: No results found for this basename: DDIMER,  in the last 72 hours Hemoglobin A1C: No results found for this basename: HGBA1C,  in the last 72 hours Fasting Lipid Panel: No results found for this basename: CHOL, HDL, LDLCALC, TRIG, CHOLHDL, LDLDIRECT,  in the last 72 hours Thyroid Function Tests: No results found for  this basename: TSH, T4TOTAL, FREET3, T3FREE, THYROIDAB,  in the last 72 hours Anemia Panel: No results found for this basename: VITAMINB12, FOLATE, FERRITIN, TIBC, IRON, RETICCTPCT,  in the last 72 hours  RADIOLOGY: Dg Chest Portable 1 View  10/27/2012  *RADIOLOGY REPORT*  Clinical Data: Atrial fibrillation, dizziness  PORTABLE CHEST - 1 VIEW  Comparison: None.  Findings: Bilateral layering pleural effusions with associated bibasilar opacities. Moderate pulmonary vascular congestion. Cardiopulmonary silhouette is partially obscured by the bibasilar processes but appears enlarged.  Atherosclerotic calcification noted in the transverse aorta.  No acute osseous abnormality.  IMPRESSION:  Cardiomegaly, pulmonary vascular congestion and bilateral layering effusions suggest underlying CHF.  Bibasilar opacities favored to reflect pleural fluid with atelectasis.  Superimposed consolidation or mass lesion is difficult to exclude on this single view.   Original Report Authenticated By: Malachy Moan, M.D.     PHYSICAL EXAM General: NAD Neck: No JVD, no thyromegaly or thyroid nodule.  Lungs: Clear to auscultation bilaterally with normal respiratory effort. CV: Nondisplaced PMI.  Heart irregular S1/S2, no S3/S4.  2/6 murmur along sternal border.  Trace ankle edema.  No carotid bruit.  Normal pedal pulses.  Abdomen: Soft, nontender, no hepatosplenomegaly, no distention.  Neurologic: Alert and oriented x 3.  Psych: Normal affect. Extremities: No clubbing or cyanosis.   TELEMETRY: Reviewed telemetry pt in atrial fibrillation, rate 100s  ASSESSMENT AND PLAN: 79 yo with minimal PMH admitted with atrial fibrillation/RVR and acute diastolic CHF. 1. Atrial fibrillation: Coumadin therapeutic, reasonable rate control.  Continue current Toprol XL, diltiazem CD, and coumadin.  After 4 wks therapeutic anticoagulation, would plan DCCV (unable to pass probe for TEE).  After cardioversion and 1 month post-DCCV of  coumadin, would switch to Xarelto so she does not have to drive for INRs.  Will leave her on coumadin for now since she was already started on this.  2. Acute diastolic CHF: Volume improved, weight down.  Continue po Lasix at current dose for now.  3. Disposition: May go home today.  Needs home health followup.  Needs followup in office with me or PA next week, BMET/BNP/CBC/INR to be drawn on the same day.  Needs coumadin followup.  Will need DCCV after 4 wks therapeutic INR.   Meds: Toprol XL 25 mg daily, diltiazem CD 240 mg daily, coumadin, Lasix 40 mg daily, KCl 20 mEq daily.   Marca Ancona 11/02/2012 10:12 AM

## 2012-11-02 NOTE — Progress Notes (Signed)
Physical Therapy Treatment Patient Details Name: Melanie Blake MRN: [id number] DOB: 1923/12/11 Today's Date: 11/02/2012 Time: 8469-6295 PT Time Calculation (min): 24 min  PT Assessment / Plan / Recommendation Comments on Treatment Session  Cont's to be slightly unsteady with ambulation but overall moving well.      Follow Up Recommendations  No PT follow up     Does the patient have the potential to tolerate intense rehabilitation     Barriers to Discharge        Equipment Recommendations  None recommended by PT    Recommendations for Other Services    Frequency Min 3X/week   Plan Discharge plan remains appropriate    Precautions / Restrictions Precautions Precautions: None Restrictions Weight Bearing Restrictions: No       Mobility  Bed Mobility Bed Mobility: Not assessed Transfers Transfers: Not assessed Details for Transfer Assistance: Pt standing at sink upon arrival & requesting to finish brushing her hair standing at sink at end of session Ambulation/Gait Ambulation/Gait Assistance: 5: Supervision Ambulation Distance (Feet): 200 Feet Assistive device: None Ambulation/Gait Assistance Details: Mildly unsteady + staggering ocassionally but no physical (A) required.   Gait Pattern: Step-through pattern;Decreased stride length General Gait Details: Legs externally rotated. Stairs: No Corporate treasurer: No    Exercises General Exercises - Lower Extremity Hip ABduction/ADduction: Both;10 reps;Standing Hip Flexion/Marching: Both;10 reps;Standing Toe Raises: 10 reps;Standing Heel Raises: 10 reps;Standing Mini-Sqauts: 10 reps;Standing     PT Goals Acute Rehab PT Goals Time For Goal Achievement: 11/08/12 Potential to Achieve Goals: Good Pt will go Sit to Stand: with modified independence Pt will go Stand to Sit: with modified independence Pt will Ambulate: 51 - 150 feet;with modified independence PT Goal: Ambulate - Progress:  Progressing toward goal  Visit Information  Last PT Received On: 11/02/12 Assistance Needed: +1    Subjective Data  Patient Stated Goal: Return home.   Cognition  Cognition Overall Cognitive Status: Appears within functional limits for tasks assessed/performed Arousal/Alertness: Awake/alert Orientation Level: Appears intact for tasks assessed Behavior During Session: [facility] for tasks performed    Balance  Static Standing Balance Static Standing - Balance Support: No upper extremity supported Static Standing - Level of Assistance: 6: Modified independent (Device/Increase time)  End of Session PT - End of Session Activity Tolerance: Patient tolerated treatment well Patient left: Other (comment) (standing at sink per her request) Nurse Communication: Mobility status     Verdell Face, [place] [phone] 11/02/2012

## 2012-11-02 NOTE — Progress Notes (Signed)
Pt being dc to home with home health aid, pt given dc instructions, medications reviewed, and follow up appointments, pt verbalized understanding, pt leaving via wheelchair with niece, pt stable

## 2012-11-03 ENCOUNTER — Telehealth: Payer: Self-pay | Admitting: *Deleted

## 2012-11-03 NOTE — Telephone Encounter (Signed)
TCM management - No answer/No voicemail. Routed to Triage to call on 4/17.

## 2012-11-04 DIAGNOSIS — I4891 Unspecified atrial fibrillation: Secondary | ICD-10-CM | POA: Diagnosis not present

## 2012-11-04 DIAGNOSIS — Z7901 Long term (current) use of anticoagulants: Secondary | ICD-10-CM | POA: Diagnosis not present

## 2012-11-04 DIAGNOSIS — I509 Heart failure, unspecified: Secondary | ICD-10-CM | POA: Diagnosis not present

## 2012-11-04 DIAGNOSIS — I5031 Acute diastolic (congestive) heart failure: Secondary | ICD-10-CM | POA: Diagnosis not present

## 2012-11-04 DIAGNOSIS — R5381 Other malaise: Secondary | ICD-10-CM | POA: Diagnosis not present

## 2012-11-04 NOTE — Telephone Encounter (Signed)
SPOKE WITH PT  NO COMPLAINTS  FEELS FINE  PER PT  ALSO PT IS TAKING MEDS AS INSTRUCTED FROM DISCHARGE AND HAS FOLLOW UP WITH DR [facility] ON 11-10-12  ./CY

## 2012-11-10 ENCOUNTER — Ambulatory Visit (INDEPENDENT_AMBULATORY_CARE_PROVIDER_SITE_OTHER): Payer: Medicare Other | Admitting: *Deleted

## 2012-11-10 ENCOUNTER — Encounter: Payer: Self-pay | Admitting: Cardiology

## 2012-11-10 ENCOUNTER — Ambulatory Visit (INDEPENDENT_AMBULATORY_CARE_PROVIDER_SITE_OTHER): Payer: Medicare Other | Admitting: Cardiology

## 2012-11-10 ENCOUNTER — Other Ambulatory Visit (INDEPENDENT_AMBULATORY_CARE_PROVIDER_SITE_OTHER): Payer: Medicare Other

## 2012-11-10 VITALS — BP 110/62 | HR 91 | Ht 60.0 in | Wt 128.0 lb

## 2012-11-10 DIAGNOSIS — R0989 Other specified symptoms and signs involving the circulatory and respiratory systems: Secondary | ICD-10-CM

## 2012-11-10 DIAGNOSIS — I4891 Unspecified atrial fibrillation: Secondary | ICD-10-CM

## 2012-11-10 DIAGNOSIS — I509 Heart failure, unspecified: Secondary | ICD-10-CM

## 2012-11-10 DIAGNOSIS — I5032 Chronic diastolic (congestive) heart failure: Secondary | ICD-10-CM | POA: Diagnosis not present

## 2012-11-10 DIAGNOSIS — R0602 Shortness of breath: Secondary | ICD-10-CM | POA: Diagnosis not present

## 2012-11-10 LAB — BASIC METABOLIC PANEL
BUN: 29 mg/dL — ABNORMAL HIGH (ref 6–23)
CO2: 28 mEq/L (ref 19–32)
Calcium: 9.6 mg/dL (ref 8.4–10.5)
Creatinine, Ser: 1.4 mg/dL — ABNORMAL HIGH (ref 0.4–1.2)
GFR: 37.49 mL/min — ABNORMAL LOW (ref >60.00)
Glucose, Bld: 85 mg/dL (ref 70–99)
Sodium: 136 mEq/L (ref 135–145)

## 2012-11-10 LAB — PROTIME-INR: Prothrombin Time: 135 s (ref 10.2–12.4)

## 2012-11-10 NOTE — Patient Instructions (Addendum)
Your physician recommends that you have  lab work BMET/BNP today.  The coumadin clinic will check your pro-time today and help make arrangements for weekly pro-times. When your pro-time has been >2 weekly for 4 weeks we will schedule a cardioversion.   You have  a follow-up appointment with Dr Shirlee Latch Monday May 19,2014 at 10:15 AM.

## 2012-11-11 DIAGNOSIS — I4891 Unspecified atrial fibrillation: Secondary | ICD-10-CM | POA: Insufficient documentation

## 2012-11-11 DIAGNOSIS — I5032 Chronic diastolic (congestive) heart failure: Secondary | ICD-10-CM | POA: Insufficient documentation

## 2012-11-11 NOTE — Progress Notes (Signed)
Patient ID: Melanie Blake, female   DOB: 1923-11-14, 79 y.o.   MRN: [id number] 79 yo with minimal past history was admitted to [facility] in 4/14 with complaints of weakness and dyspnea.  She was found to have atrial fibrillation with RVR in the 140s.  She was noted to have acute on chronic diastolic CHF.  She was rate-controlled and TEE was attempted pre-cardioversion.  I was unable to pass the probe for TEE.  Therefore, she was not cardioverted.   Since getting home again, she seems to be doing well.  No exertional dyspnea.  She has been doing housework.  She is able to walk to her niece's house.  She remains in atrial fibrillation today with controlled rate.    Labs (4/14): K 4.4, creatinine 1.3, HCT 42.7  PMH: 1. Persistent atrial fibrillation: Diagnosed in 4/14.  2. Chronic diastolic CHF: Echo (4/14) with EF "grossly normal," mild AI, mild MR, PA systolic pressure 40 mm Hg, mildly decreased RV systolic function.   SH: Single, lives alone in a townhouse, niece lives nearby, nonsmoker.   FH: No CAD.    ROS: All systems reviewed and negative except as per HPI.   Current Outpatient Prescriptions  Medication Sig Dispense Refill  . diltiazem (CARDIZEM CD) 240 MG 24 hr capsule Take 1 capsule (240 mg total) by mouth daily.  30 capsule  11  . furosemide (LASIX) 40 MG tablet Take 1 tablet (40 mg total) by mouth daily.  30 tablet  11  . metoprolol succinate (TOPROL XL) 25 MG 24 hr tablet Take 1 tablet (25 mg total) by mouth daily.  30 tablet  11  . potassium chloride (K-DUR) 10 MEQ tablet Take 1 tablet (10 mEq total) by mouth daily.  30 tablet  11  . warfarin (COUMADIN) 5 MG tablet Take 1 tablet (5 mg total) by mouth daily. Start with 5 mg/2.5 mg/2.5 mg.  5 mg 4/15, 2.5 mg 4/16 and 4/17 and continue this pattern.  60 tablet  11   No current facility-administered medications for this visit.    BP 110/62  Pulse 91  Ht 5' (1.524 m)  Wt 128 lb (58.06 kg)  BMI 25 kg/m2  SpO2 97% General:  NAD Neck: No JVD, no thyromegaly or thyroid nodule.  Lungs: Clear to auscultation bilaterally with normal respiratory effort. CV: Nondisplaced PMI.  Heart regular S1/S2, no S3/S4, no murmur.  1+ ankle edema.  No carotid bruit.  Normal pedal pulses.  Abdomen: Soft, nontender, no hepatosplenomegaly, no distention.  Skin: Venous stasis dermatitis lower legs.   Neurologic: Alert and oriented x 3.  Psych: Normal affect. Extremities: No clubbing or cyanosis.   Assessment/Plan: 1. Atrial fibrillation: Rate is now controlled.  Given the development of CHF with atrial fibrillation/RVR, I would like to try to cardiovert her.  Unfortunately, I was unable to pass the probe for TEE while she was an inpatient.  I will, instead, anticoagulate her for a month at therapeutic INR then do a cardioversion without TEE.  Continue diltiazem CD, Toprol XL, warfarin. 1 month after cardioversion, I will transition her over Xarelto 15 mg daily.  2. Chronic diastolic CHF: NYHA class II symptoms.  She does not seem significantly volume overloaded.  Continue current Lasix and KCl doses.  Check BMET/BNP today.   Marca Ancona 11/11/2012

## 2012-11-15 ENCOUNTER — Ambulatory Visit (INDEPENDENT_AMBULATORY_CARE_PROVIDER_SITE_OTHER): Payer: Medicare Other | Admitting: Cardiology

## 2012-11-15 DIAGNOSIS — I4891 Unspecified atrial fibrillation: Secondary | ICD-10-CM

## 2012-11-16 ENCOUNTER — Encounter: Payer: Medicare Other | Admitting: Nurse Practitioner

## 2012-11-22 ENCOUNTER — Ambulatory Visit (INDEPENDENT_AMBULATORY_CARE_PROVIDER_SITE_OTHER): Payer: Medicare Other | Admitting: Internal Medicine

## 2012-11-22 DIAGNOSIS — I4891 Unspecified atrial fibrillation: Secondary | ICD-10-CM

## 2012-11-22 LAB — POCT INR: INR: 3.4

## 2012-11-29 ENCOUNTER — Telehealth: Payer: Self-pay | Admitting: Cardiology

## 2012-11-29 ENCOUNTER — Ambulatory Visit (INDEPENDENT_AMBULATORY_CARE_PROVIDER_SITE_OTHER): Payer: Medicare Other | Admitting: Cardiovascular Disease

## 2012-11-29 DIAGNOSIS — I4891 Unspecified atrial fibrillation: Secondary | ICD-10-CM

## 2012-11-29 LAB — POCT INR: INR: 4.3

## 2012-11-29 NOTE — Telephone Encounter (Signed)
Melanie Blake with advanced care calling with pt /inr, done today inr 4.3 and pt 52 ,pt taking 1/2 5mg  daily

## 2012-11-29 NOTE — Telephone Encounter (Signed)
INR addressed.  See anticoagulation note from today.

## 2012-12-06 ENCOUNTER — Encounter: Payer: Self-pay | Admitting: *Deleted

## 2012-12-06 ENCOUNTER — Ambulatory Visit (INDEPENDENT_AMBULATORY_CARE_PROVIDER_SITE_OTHER): Payer: Medicare Other | Admitting: *Deleted

## 2012-12-06 ENCOUNTER — Ambulatory Visit (INDEPENDENT_AMBULATORY_CARE_PROVIDER_SITE_OTHER): Payer: Medicare Other | Admitting: Cardiology

## 2012-12-06 ENCOUNTER — Encounter: Payer: Self-pay | Admitting: Cardiology

## 2012-12-06 VITALS — BP 130/70 | HR 101 | Ht 60.0 in | Wt 128.0 lb

## 2012-12-06 DIAGNOSIS — I5031 Acute diastolic (congestive) heart failure: Secondary | ICD-10-CM

## 2012-12-06 DIAGNOSIS — I4891 Unspecified atrial fibrillation: Secondary | ICD-10-CM

## 2012-12-06 DIAGNOSIS — I5032 Chronic diastolic (congestive) heart failure: Secondary | ICD-10-CM | POA: Diagnosis not present

## 2012-12-06 DIAGNOSIS — I509 Heart failure, unspecified: Secondary | ICD-10-CM

## 2012-12-06 LAB — BASIC METABOLIC PANEL
Chloride: 99 mEq/L (ref 96–112)
GFR: 39.42 mL/min — ABNORMAL LOW (ref >60.00)
Potassium: 3.8 mEq/L (ref 3.5–5.1)
Sodium: 138 mEq/L (ref 135–145)

## 2012-12-06 LAB — T3, FREE: T3, Free: 2.8 pg/mL (ref 2.3–4.2)

## 2012-12-06 LAB — TSH: TSH: 4.12 u[IU]/mL (ref 0.35–5.50)

## 2012-12-06 LAB — POCT INR: INR: 3.8

## 2012-12-06 LAB — T4, FREE: Free T4: 1.18 ng/dL (ref 0.60–1.60)

## 2012-12-06 MED ORDER — METOPROLOL SUCCINATE ER 50 MG PO TB24: 50.0000 mg | ORAL_TABLET | Freq: Every day | ORAL | Status: DC

## 2012-12-06 NOTE — Patient Instructions (Addendum)
Increase Toprol XL to 50mg  daily. You can take 2 of your 25mg  tablets daily at the same time and use your current supply.   Your physician recommends that you have lab work today--BMET/TSH/Free T4/Free T3.  Your physician has recommended that you have a Cardioversion (DCCV). Electrical Cardioversion uses a jolt of electricity to your heart either through paddles or wired patches attached to your chest. This is a controlled, usually prescheduled, procedure. Defibrillation is done under light anesthesia in the hospital, and you usually go home the day of the procedure. This is done to get your heart back into a normal rhythm. You are not awake for the procedure. Please see the instruction sheet given to you today. Thursday June 5,2014  Your physician recommends that you schedule a follow-up appointment with Dr Shirlee Latch 2 weeks after the cardioversion scheduled for December 23, 2012.

## 2012-12-06 NOTE — Progress Notes (Signed)
Patient ID: Melanie Blake, female   DOB: 09/29/1923, 79 y.o.   MRN: [id number] 79 yo with minimal past history was admitted to [facility] in 4/14 with complaints of weakness and dyspnea.  She was found to have atrial fibrillation with RVR in the 140s.  She was noted to have acute on chronic diastolic CHF.  She was rate-controlled and TEE was attempted pre-cardioversion.  I was unable to pass the probe for TEE.  Therefore, she was not cardioverted.   Since getting home again, she seems to be doing well.  No exertional dyspnea.  She has been doing housework.  She is able to walk to her niece's house.  She remains in atrial fibrillation today, rate is a bit high in the 100s.  No chest pain.  Her INR has been therapeutic on coumadin since 5/5.   Labs (4/14): K 4.4, creatinine 1.3 => 1.4, HCT 42.7  ECG: atrial fibrillation at 101  PMH: 1. Persistent atrial fibrillation: Diagnosed in 4/14.  2. Chronic diastolic CHF: Echo (4/14) with EF "grossly normal," mild AI, mild MR, PA systolic pressure 40 mm Hg, mildly decreased RV systolic function.   SH: Single, lives alone in a townhouse, niece lives nearby, nonsmoker.   FH: No CAD.    ROS: All systems reviewed and negative except as per HPI.   Current Outpatient Prescriptions  Medication Sig Dispense Refill  . diltiazem (CARDIZEM CD) 240 MG 24 hr capsule Take 1 capsule (240 mg total) by mouth daily.  30 capsule  11  . furosemide (LASIX) 40 MG tablet Take 1 tablet (40 mg total) by mouth daily.  30 tablet  11  . potassium chloride (K-DUR) 10 MEQ tablet Take 1 tablet (10 mEq total) by mouth daily.  30 tablet  11  . warfarin (COUMADIN) 5 MG tablet Take 1 tablet (5 mg total) by mouth daily. Start with 5 mg/2.5 mg/2.5 mg.  5 mg 4/15, 2.5 mg 4/16 and 4/17 and continue this pattern.  60 tablet  11  . metoprolol succinate (TOPROL-XL) 50 MG 24 hr tablet Take 1 tablet (50 mg total) by mouth daily. Take with or immediately following a meal.  30 tablet  6   No  current facility-administered medications for this visit.    BP 130/70  Pulse 101  Ht 5' (1.524 m)  Wt 128 lb (58.06 kg)  BMI 25 kg/m2  SpO2 95% General: NAD Neck: No JVD, no thyromegaly or thyroid nodule.  Lungs: Clear to auscultation bilaterally with normal respiratory effort. CV: Nondisplaced PMI.  Heart regular S1/S2, no S3/S4, no murmur.  Trace ankle edema.  No carotid bruit.  Normal pedal pulses.  Abdomen: Soft, nontender, no hepatosplenomegaly, no distention.  Skin: Venous stasis dermatitis lower legs.   Neurologic: Alert and oriented x 3.  Psych: Normal affect. Extremities: No clubbing or cyanosis.   Assessment/Plan: 1. Atrial fibrillation:  Given the development of CHF with atrial fibrillation/RVR, I would like to try to cardiovert her.  Unfortunately, I was unable to pass the probe for TEE while she was an inpatient.  I will, instead, anticoagulate her for a month at therapeutic INR then do a cardioversion without TEE.  Her INR has been therapeutic since 5/5. - Continue diltiazem CD, increase Toprol XL to 50 mg daily. - If INR remains therapeutic, DCCV on 6/5.  - Continue coumadin.  1 month after cardioversion, I will transition her over Xarelto 15 mg daily.  2. Chronic diastolic CHF: NYHA class II symptoms.    She does not seem significantly volume overloaded.  Continue current Lasix and KCl doses.  Check BMET today.   Carliss Porcaro 12/06/2012  

## 2012-12-08 ENCOUNTER — Telehealth: Payer: Self-pay | Admitting: Cardiology

## 2012-12-08 NOTE — Telephone Encounter (Signed)
Returned call to [place], [facility] RN clairified pt's Coumadin dosing instructions.  Pt skipped Monday 12/06/12 secondary to INR 3.8, instructed to then take 1/2 tablet daily, nothing on Fridays.  Recheck INR on 12/14/12.  Joyce Gross will relay information to pt and have her take 1/2 tablet on 12/13/12.

## 2012-12-08 NOTE — Addendum Note (Signed)
Addended by: Micki Riley C on: 12/08/2012 01:36 PM   Modules accepted: Orders

## 2012-12-08 NOTE — Telephone Encounter (Signed)
New problem   Confused about pts dosage/need clarification

## 2012-12-15 ENCOUNTER — Ambulatory Visit (INDEPENDENT_AMBULATORY_CARE_PROVIDER_SITE_OTHER): Payer: Medicare Other | Admitting: Internal Medicine

## 2012-12-15 DIAGNOSIS — I4891 Unspecified atrial fibrillation: Secondary | ICD-10-CM

## 2012-12-15 LAB — POCT INR: INR: 3.2

## 2012-12-22 ENCOUNTER — Ambulatory Visit (INDEPENDENT_AMBULATORY_CARE_PROVIDER_SITE_OTHER): Payer: Medicare Other | Admitting: Cardiology

## 2012-12-22 DIAGNOSIS — I4891 Unspecified atrial fibrillation: Secondary | ICD-10-CM

## 2012-12-22 LAB — POCT INR: INR: 2.8

## 2012-12-23 ENCOUNTER — Encounter (HOSPITAL_COMMUNITY): Payer: Self-pay | Admitting: Anesthesiology

## 2012-12-23 ENCOUNTER — Ambulatory Visit (INDEPENDENT_AMBULATORY_CARE_PROVIDER_SITE_OTHER): Payer: Medicare Other | Admitting: *Deleted

## 2012-12-23 ENCOUNTER — Ambulatory Visit (HOSPITAL_COMMUNITY)
Admission: RE | Admit: 2012-12-23 | Discharge: 2012-12-23 | Disposition: A | Discharge status: Home / Self Care | Payer: Medicare Other | Source: Ambulatory Visit | Attending: Cardiology | Admitting: Cardiology

## 2012-12-23 ENCOUNTER — Encounter (HOSPITAL_COMMUNITY)
Admission: RE | Disposition: A | Discharge status: Home / Self Care | Payer: Self-pay | Source: Ambulatory Visit | Attending: Cardiology

## 2012-12-23 ENCOUNTER — Ambulatory Visit (HOSPITAL_COMMUNITY): Admission: RE | Admit: 2012-12-23 | Payer: Medicare Other | Source: Ambulatory Visit | Admitting: Cardiology

## 2012-12-23 ENCOUNTER — Ambulatory Visit (HOSPITAL_COMMUNITY): Payer: Medicare Other | Admitting: Anesthesiology

## 2012-12-23 ENCOUNTER — Other Ambulatory Visit: Payer: Self-pay | Admitting: Cardiology

## 2012-12-23 ENCOUNTER — Encounter (HOSPITAL_COMMUNITY): Payer: Self-pay | Admitting: *Deleted

## 2012-12-23 ENCOUNTER — Encounter (HOSPITAL_COMMUNITY): Admission: RE | Payer: Self-pay | Source: Ambulatory Visit

## 2012-12-23 DIAGNOSIS — I5033 Acute on chronic diastolic (congestive) heart failure: Secondary | ICD-10-CM | POA: Diagnosis not present

## 2012-12-23 DIAGNOSIS — I872 Venous insufficiency (chronic) (peripheral): Secondary | ICD-10-CM | POA: Diagnosis not present

## 2012-12-23 DIAGNOSIS — I509 Heart failure, unspecified: Secondary | ICD-10-CM | POA: Diagnosis not present

## 2012-12-23 DIAGNOSIS — I4891 Unspecified atrial fibrillation: Secondary | ICD-10-CM

## 2012-12-23 DIAGNOSIS — Z7901 Long term (current) use of anticoagulants: Secondary | ICD-10-CM | POA: Diagnosis not present

## 2012-12-23 DIAGNOSIS — Z79899 Other long term (current) drug therapy: Secondary | ICD-10-CM | POA: Diagnosis not present

## 2012-12-23 DIAGNOSIS — N189 Chronic kidney disease, unspecified: Secondary | ICD-10-CM | POA: Diagnosis not present

## 2012-12-23 HISTORY — PX: CARDIOVERSION: SHX1299

## 2012-12-23 SURGERY — CARDIOVERSION
Anesthesia: Monitor Anesthesia Care

## 2012-12-23 MED ORDER — SODIUM CHLORIDE 0.9 % IJ SOLN: 3.0000 mL | INTRAMUSCULAR | Status: DC | PRN

## 2012-12-23 MED ORDER — PROPOFOL 10 MG/ML IV BOLUS
INTRAVENOUS | Status: DC | PRN
  Administered 2012-12-23: 40 mg via INTRAVENOUS

## 2012-12-23 MED ORDER — SODIUM CHLORIDE 0.9 % IV SOLN
INTRAVENOUS | Status: DC | PRN
  Administered 2012-12-23: 14:00:00 via INTRAVENOUS

## 2012-12-23 MED ORDER — SODIUM CHLORIDE 0.9 % IJ SOLN: 3.0000 mL | Freq: Two times a day (BID) | INTRAMUSCULAR | Status: DC

## 2012-12-23 MED ORDER — SODIUM CHLORIDE 0.9 % IV SOLN
INTRAVENOUS | Status: DC
  Administered 2012-12-23: 500 mL via INTRAVENOUS

## 2012-12-23 MED ORDER — LIDOCAINE HCL 1 % IJ SOLN
INTRAMUSCULAR | Status: DC | PRN
  Administered 2012-12-23: 20 mg via INTRADERMAL

## 2012-12-23 MED ORDER — HYDROCORTISONE 1 % EX CREA
1.0000 "application " | TOPICAL_CREAM | Freq: Three times a day (TID) | CUTANEOUS | Status: DC | PRN
  Filled 2012-12-23: qty 28

## 2012-12-23 MED ORDER — SODIUM CHLORIDE 0.9 % IV SOLN: 250.0000 mL | INTRAVENOUS | Status: DC

## 2012-12-23 NOTE — Anesthesia Postprocedure Evaluation (Signed)
  Anesthesia Post-op Note  Patient: Melanie Blake  Procedure(s) Performed: Procedure(s): CARDIOVERSION (N/A)  Patient Location: Endoscopy Unit  Anesthesia Type:General  Level of Consciousness: awake, alert  and oriented  Airway and Oxygen Therapy: Patient Spontanous Breathing  Post-op Pain: none  Post-op Assessment: Post-op Vital signs reviewed, Patient's Cardiovascular Status Stable, Respiratory Function Stable, Patent Airway, No signs of Nausea or vomiting and Pain level controlled  Post-op Vital Signs: Reviewed and stable  Complications: No apparent anesthesia complications

## 2012-12-23 NOTE — Interval H&P Note (Signed)
History and Physical Interval Note:  12/23/2012 2:08 PM  Melanie Blake  has presented today for surgery, with the diagnosis of a-fib  The various methods of treatment have been discussed with the patient and family. After consideration of risks, benefits and other options for treatment, the patient has consented to  Procedure(s): CARDIOVERSION (N/A) as a surgical intervention .  The patient's history has been reviewed, patient examined, no change in status, stable for surgery.  I have reviewed the patient's chart and labs.  Questions were answered to the patient's satisfaction.     Aaylah Pokorny [employer]

## 2012-12-23 NOTE — Transfer of Care (Signed)
Immediate Anesthesia Transfer of Care Note  Patient: Melanie Blake  Procedure(s) Performed: Procedure(s): CARDIOVERSION (N/A)  Patient Location: Endoscopy Unit  Anesthesia Type:General  Level of Consciousness: awake  Airway & Oxygen Therapy: Patient Spontanous Breathing  Post-op Assessment: Report given to PACU RN, Post -op Vital signs reviewed and stable and Patient moving all extremities  Post vital signs: Reviewed and stable  Complications: No apparent anesthesia complications

## 2012-12-23 NOTE — H&P (View-Only) (Signed)
Patient ID: Melanie Blake, female   DOB: June 19, 1924, 79 y.o.   MRN: [id number] 79 yo with minimal past history was admitted to [facility] in 4/14 with complaints of weakness and dyspnea.  She was found to have atrial fibrillation with RVR in the 140s.  She was noted to have acute on chronic diastolic CHF.  She was rate-controlled and TEE was attempted pre-cardioversion.  I was unable to pass the probe for TEE.  Therefore, she was not cardioverted.   Since getting home again, she seems to be doing well.  No exertional dyspnea.  She has been doing housework.  She is able to walk to her niece's house.  She remains in atrial fibrillation today, rate is a bit high in the 100s.  No chest pain.  Her INR has been therapeutic on coumadin since 5/5.   Labs (4/14): K 4.4, creatinine 1.3 => 1.4, HCT 42.7  ECG: atrial fibrillation at 101  PMH: 1. Persistent atrial fibrillation: Diagnosed in 4/14.  2. Chronic diastolic CHF: Echo (4/14) with EF "grossly normal," mild AI, mild MR, PA systolic pressure 40 mm Hg, mildly decreased RV systolic function.   SH: Single, lives alone in a townhouse, niece lives nearby, nonsmoker.   FH: No CAD.    ROS: All systems reviewed and negative except as per HPI.   Current Outpatient Prescriptions  Medication Sig Dispense Refill  . diltiazem (CARDIZEM CD) 240 MG 24 hr capsule Take 1 capsule (240 mg total) by mouth daily.  30 capsule  11  . furosemide (LASIX) 40 MG tablet Take 1 tablet (40 mg total) by mouth daily.  30 tablet  11  . potassium chloride (K-DUR) 10 MEQ tablet Take 1 tablet (10 mEq total) by mouth daily.  30 tablet  11  . warfarin (COUMADIN) 5 MG tablet Take 1 tablet (5 mg total) by mouth daily. Start with 5 mg/2.5 mg/2.5 mg.  5 mg 4/15, 2.5 mg 4/16 and 4/17 and continue this pattern.  60 tablet  11  . metoprolol succinate (TOPROL-XL) 50 MG 24 hr tablet Take 1 tablet (50 mg total) by mouth daily. Take with or immediately following a meal.  30 tablet  6   No  current facility-administered medications for this visit.    BP 130/70  Pulse 101  Ht 5' (1.524 m)  Wt 128 lb (58.06 kg)  BMI 25 kg/m2  SpO2 95% General: NAD Neck: No JVD, no thyromegaly or thyroid nodule.  Lungs: Clear to auscultation bilaterally with normal respiratory effort. CV: Nondisplaced PMI.  Heart regular S1/S2, no S3/S4, no murmur.  Trace ankle edema.  No carotid bruit.  Normal pedal pulses.  Abdomen: Soft, nontender, no hepatosplenomegaly, no distention.  Skin: Venous stasis dermatitis lower legs.   Neurologic: Alert and oriented x 3.  Psych: Normal affect. Extremities: No clubbing or cyanosis.   Assessment/Plan: 1. Atrial fibrillation:  Given the development of CHF with atrial fibrillation/RVR, I would like to try to cardiovert her.  Unfortunately, I was unable to pass the probe for TEE while she was an inpatient.  I will, instead, anticoagulate her for a month at therapeutic INR then do a cardioversion without TEE.  Her INR has been therapeutic since 5/5. - Continue diltiazem CD, increase Toprol XL to 50 mg daily. - If INR remains therapeutic, DCCV on 6/5.  - Continue coumadin.  1 month after cardioversion, I will transition her over Xarelto 15 mg daily.  2. Chronic diastolic CHF: NYHA class II symptoms.  She does not seem significantly volume overloaded.  Continue current Lasix and KCl doses.  Check BMET today.   Marca Ancona 12/06/2012

## 2012-12-23 NOTE — Anesthesia Postprocedure Evaluation (Signed)
  Anesthesia Post-op Note  Patient: Melanie Blake  Procedure(s) Performed: Procedure(s): CARDIOVERSION (N/A)  Patient Location: Endoscopy Unit  Anesthesia Type:General  Level of Consciousness: awake, alert  and oriented  Airway and Oxygen Therapy: Patient Spontanous Breathing and Patient connected to nasal cannula oxygen  Post-op Pain: none  Post-op Assessment: Post-op Vital signs reviewed, Patient's Cardiovascular Status Stable, Respiratory Function Stable, Patent Airway and Pain level controlled  Post-op Vital Signs: stable  Complications: No apparent anesthesia complications

## 2012-12-23 NOTE — Procedures (Signed)
Electrical Cardioversion Procedure Note Melanie Blake [id number] [birth year]  Procedure: Electrical Cardioversion Indications:  Atrial Fibrillation  Procedure Details Consent: Risks of procedure as well as the alternatives and risks of each were explained to the (patient/caregiver).  Consent for procedure obtained. Time Out: Verified patient identification, verified procedure, site/side was marked, verified correct patient position, special equipment/implants available, medications/allergies/relevent history reviewed, required imaging and test results available.  Performed  Patient placed on cardiac monitor, pulse oximetry, supplemental oxygen as necessary.  Sedation given: Propofol 40 mg IV Pacer pads placed anterior and posterior chest.  Cardioverted 1 time(s).  Cardioverted at 200J.  Evaluation Findings: Post procedure EKG shows: NSR Complications: None Patient did tolerate procedure well.   Marca Ancona 12/23/2012, 2:18 PM

## 2012-12-23 NOTE — Anesthesia Preprocedure Evaluation (Addendum)
Anesthesia Evaluation  Patient identified by MRN, date of birth, ID band Patient awake    Reviewed: Allergy & Precautions, H&P , NPO status , Patient's Chart, lab work & pertinent test results, reviewed documented beta blocker date and time   Airway Mallampati: II TM Distance: >3 FB Neck ROM: Full    Dental  (+) Partial Upper, Dental Advisory Given and Poor Dentition   Pulmonary  breath sounds clear to auscultation        Cardiovascular +CHF + dysrhythmias Atrial Fibrillation Rhythm:Irregular Rate:Normal     Neuro/Psych    GI/Hepatic   Endo/Other    Renal/GU      Musculoskeletal   Abdominal   Peds  Hematology   Anesthesia Other Findings   Reproductive/Obstetrics                          Anesthesia Physical Anesthesia Plan  ASA: III  Anesthesia Plan: General   Post-op Pain Management:    Induction: Intravenous  Airway Management Planned: Mask and Natural Airway  Additional Equipment:   Intra-op Plan:   Post-operative Plan:   Informed Consent: I have reviewed the patients History and Physical, chart, labs and discussed the procedure including the risks, benefits and alternatives for the proposed anesthesia with the patient or authorized representative who has indicated his/her understanding and acceptance.   Dental advisory given  Plan Discussed with: CRNA, Anesthesiologist and Surgeon  Anesthesia Plan Comments:        Anesthesia Quick Evaluation

## 2012-12-24 LAB — POCT I-STAT 4, (NA,K, GLUC, HGB,HCT)
Glucose, Bld: 87 mg/dL (ref 70–99)
HCT: 43 % (ref 36.0–46.0)
Hemoglobin: 14.6 g/dL (ref 12.0–15.0)
Potassium: 4 mEq/L (ref 3.5–5.1)
Sodium: 142 mEq/L (ref 135–145)

## 2012-12-26 ENCOUNTER — Encounter (HOSPITAL_COMMUNITY): Payer: Self-pay | Admitting: Cardiology

## 2012-12-30 ENCOUNTER — Ambulatory Visit (INDEPENDENT_AMBULATORY_CARE_PROVIDER_SITE_OTHER): Payer: Medicare Other | Admitting: Pharmacist

## 2012-12-30 DIAGNOSIS — I4891 Unspecified atrial fibrillation: Secondary | ICD-10-CM

## 2013-01-04 ENCOUNTER — Ambulatory Visit (INDEPENDENT_AMBULATORY_CARE_PROVIDER_SITE_OTHER): Payer: Medicare Other | Admitting: Pharmacist

## 2013-01-04 ENCOUNTER — Encounter: Payer: Self-pay | Admitting: Physician Assistant

## 2013-01-04 ENCOUNTER — Ambulatory Visit (INDEPENDENT_AMBULATORY_CARE_PROVIDER_SITE_OTHER): Payer: Medicare Other | Admitting: Physician Assistant

## 2013-01-04 VITALS — BP 128/62 | HR 74 | Ht <= 58 in | Wt 126.2 lb

## 2013-01-04 DIAGNOSIS — I5032 Chronic diastolic (congestive) heart failure: Secondary | ICD-10-CM

## 2013-01-04 DIAGNOSIS — I4891 Unspecified atrial fibrillation: Secondary | ICD-10-CM

## 2013-01-04 DIAGNOSIS — I509 Heart failure, unspecified: Secondary | ICD-10-CM

## 2013-01-04 LAB — POCT INR: INR: 4.1

## 2013-01-04 NOTE — Progress Notes (Signed)
[address] [place], [place]  [address] Phone: [phone] Fax:  [phone]  Date:  01/04/2013   ID:  Melanie Blake, DOB 1924-07-20, MRN [id number]  PCP:  No PCP Per Patient  Cardiologist:  Dr. Marca Ancona     History of Present Illness: Melanie Blake is a 79 y.o. female who returns for follow up after a recent DCCV.  She has a minimal PMH.  She was admitted in 10/2012 with acute diastolic CHF in the setting of AFib with RVR.  Presented with weakness and dyspnea.  TEE-DCCV was attempted but the TEE probe could not be passed.  She was therefore treated with rate control and anticoagulation.  She was last seen by Dr. Marca Ancona 12/06/12.  After one month of appropriate anticoagulation, she underwent DCCV 12/23/12 restoring NSR.  Plan was to transition her to Xarelto 15 mg QD one month after DCCV.  Since her DCCV, she has felt much better.  She denies any further dizziness.  She denies significant DOE.  She is probably NYHA Class IIb.  No chest pain.  No orthopnea, PND.  LE edema is improved.  No syncope.    Labs (4/14):  K 4.4, Cr 1.3=>1.4, HCT 42.7 Labs (5/14):  K 3.8, Cr 1.4, TSH 4.12 Labs (6/14):  K 4, HCT 43  Wt Readings from Last 3 Encounters:  01/04/13 126 lb 2.6 oz (57.226 kg)  12/06/12 128 lb (58.06 kg)  11/10/12 128 lb (58.06 kg)     Past Medical History  Diagnosis Date  . Cataract   . Atrial fibrillation     dx 10/2012; s/p DCCV => NSR 12/23/2012  . Chronic diastolic heart failure     a. Echo 4/14: EF "grossly normal", mild AI, mild MR, PASP 40, mild reduced RVSF    Current Outpatient Prescriptions  Medication Sig Dispense Refill  . diltiazem (CARDIZEM CD) 240 MG 24 hr capsule Take 1 capsule (240 mg total) by mouth daily.  30 capsule  11  . furosemide (LASIX) 40 MG tablet Take 1 tablet (40 mg total) by mouth daily.  30 tablet  11  . metoprolol succinate (TOPROL-XL) 50 MG 24 hr tablet Take 1 tablet (50 mg total) by mouth daily. Take with or  immediately following a meal.  30 tablet  6  . potassium chloride (K-DUR) 10 MEQ tablet Take 1 tablet (10 mEq total) by mouth daily.  30 tablet  11  . warfarin (COUMADIN) 5 MG tablet Take 1 tablet (5 mg total) by mouth daily. Start with 5 mg/2.5 mg/2.5 mg.  5 mg 4/15, 2.5 mg 4/16 and 4/17 and continue this pattern.  60 tablet  11   No current facility-administered medications for this visit.    Allergies:    Allergies  Allergen Reactions  . Penicillins Swelling  . Sulfa Antibiotics Hives and Swelling    Social History:  The patient  reports that she has never smoked. She does not have any smokeless tobacco history on file. She reports that she does not drink alcohol or use illicit drugs.   ROS:  Please see the history of present illness.   No bleeding problems.    All other systems reviewed and negative.   PHYSICAL EXAM: VS:  BP 128/62  Pulse 74  Ht 4' (1.219 m)  Wt 126 lb 2.6 oz (57.226 kg)  BMI 38.51 kg/m2 Well nourished, well developed, in no acute distress HEENT: normal Neck: no JVD at 90 degrees Cardiac:  normal S1, S2; irregularly irregular rhythm; no murmur Lungs:  clear to auscultation bilaterally, no wheezing, rhonchi or rales Abd: soft, nontender Ext: trace brawny bilateral LE edema Skin: warm and dry Neuro:  CNs 2-12 intact, no focal abnormalities noted  EKG:  AFib, HR 74     ASSESSMENT AND PLAN:  1. Atrial Fibrillation:  She feels much better.  But, she is back in AFib.  As she appears to be asymptomatic, I would think we could pursue a rate control strategy (AFFIRM trial).  I d/w Dr. Marca Ancona who agreed.  CHADS2-VASc=4.  Continue anticoagulation.  Plan to transition her from coumadin to Xarelto 15 mg QD 1 month after DCCV.  She has f/u with the coumadin clinic soon.  I spoke with Melanie Blake, PharmD today who will assist and changing this medication when the patient follows up. 2. Chronic Diastolic CHF:  Volume stable.  Continue current Rx. 3. Disposition:   F/u with Dr. Marca Ancona in 3 mos.   Signed, Melanie Newcomer, PA-C  01/04/2013 4:16 PM

## 2013-01-04 NOTE — Patient Instructions (Addendum)
PLEASE KEEP YOU APPT WITH THE COUMADIN CLINIC 01/14/13  NO CHANGES WERE MADE TODAY  PLEASE FOLLOW UP WITH DR. Shirlee Latch 04/15/13 @ 8:30

## 2013-01-14 ENCOUNTER — Ambulatory Visit (INDEPENDENT_AMBULATORY_CARE_PROVIDER_SITE_OTHER): Payer: Medicare Other | Admitting: *Deleted

## 2013-01-14 DIAGNOSIS — I4891 Unspecified atrial fibrillation: Secondary | ICD-10-CM | POA: Diagnosis not present

## 2013-01-14 LAB — CBC
Hemoglobin: 13.4 g/dL (ref 12.0–15.0)
MCH: 28 pg (ref 26.0–34.0)
MCHC: 33.7 g/dL (ref 30.0–36.0)

## 2013-01-14 LAB — BASIC METABOLIC PANEL
BUN: 23 mg/dL (ref 6–23)
Calcium: 9.4 mg/dL (ref 8.4–10.5)
Glucose, Bld: 107 mg/dL — ABNORMAL HIGH (ref 70–99)
Sodium: 137 mEq/L (ref 135–145)

## 2013-01-14 MED ORDER — RIVAROXABAN 15 MG PO TABS: 15.0000 mg | ORAL_TABLET | Freq: Every day | ORAL | Status: DC

## 2013-01-14 NOTE — Patient Instructions (Addendum)
Pt was started on Xarelto  15mg  daily for  Atrial Fib  on Sunday January 16, 2013.    Reviewed patients medication list.  Pt is not  currently on any combined P-gp and strong CYP3A4 inhibitors/inducers (ketoconazole, traconazole, ritonavir, carbamazepine, phenytoin, rifampin, St. John's wort).  Reviewed labs.  SCr 1.35, Weight 57.327 kg , CrCl 26.569 .  Dose is  appropriate based on CrCl.   Hgb 13.4 and HCT 39.8  A full discussion of the nature of anticoagulants has been carried out.  A benefit/risk analysis has been presented to the patient, so that they understand the justification for choosing anticoagulation with Xarelto at this time.  The need for compliance is stressed.  Pt is aware to take the medication once daily with the largest meal of the day.  Side effects of potential bleeding are discussed, including unusual colored urine or stools, coughing up blood or coffee ground emesis, nose bleeds or serious fall or head trauma.  Discussed signs and symptoms of stroke. The patient should avoid any OTC items containing aspirin or ibuprofen.  Avoid alcohol consumption.   Call if any signs of abnormal bleeding.  Discussed financial obligations and resolved any difficulty in obtaining medication.  Next lab test test in1 months.  01/17/2013 Pt called and informed that her dose is appropriate  based on her labs of Friday Xarelto 15mg  daily . Pt states she started Xarelto on Sunday and has called her   Insurance company  regarding obtaining the Xarelto and they will pay most of the cost of this medication. Also reviewed with pt the time on her recheck visit in one month and she states understanding

## 2013-02-11 ENCOUNTER — Ambulatory Visit (INDEPENDENT_AMBULATORY_CARE_PROVIDER_SITE_OTHER): Payer: Medicare Other | Admitting: *Deleted

## 2013-02-11 DIAGNOSIS — I4891 Unspecified atrial fibrillation: Secondary | ICD-10-CM

## 2013-02-11 NOTE — Progress Notes (Signed)
Pt was started on Xarelto 15 mg daily for AF   Reviewed patients medication list.  Pt is not currently on any combined P-gp and strong CYP3A4 inhibitors/inducers (ketoconazole, traconazole, ritonavir, carbamazepine, phenytoin, rifampin, St. John's wort).  Reviewed labs.  SCr not collected, Weight 57.22, CrCl- not done.  Dose appropriate based on CrCl.   Hgb 12.2 and HCT 36.4, last cc with bmet 26.4. Reviewed with Weston Brass, PharmD.  A full discussion of the nature of anticoagulants has been carried out.  A benefit/risk analysis has been presented to the patient, so that they understand the justification for choosing anticoagulation with Xarelto at this time.  The need for compliance is stressed.  Pt is aware to take the medication once daily with the largest meal of the day.  Side effects of potential bleeding are discussed, including unusual colored urine or stools, coughing up blood or coffee ground emesis, nose bleeds or serious fall or head trauma.  Discussed signs and symptoms of stroke. The patient should avoid any OTC items containing aspirin or ibuprofen.  Avoid alcohol consumption.   Call if any signs of abnormal bleeding.  Discussed financial obligations and resolved any difficulty in obtaining medication.  Next lab test test in 6 months.

## 2013-02-12 LAB — CBC WITH DIFFERENTIAL/PLATELET
Basophils Absolute: 0.1 10*3/uL (ref 0.0–0.1)
HCT: 36.4 % (ref 36.0–46.0)
Lymphocytes Relative: 31 % (ref 12–46)
Neutro Abs: 3.6 10*3/uL (ref 1.7–7.7)
Neutrophils Relative %: 55 % (ref 43–77)
Platelets: 262 10*3/uL (ref 150–400)
RDW: 15.9 % — ABNORMAL HIGH (ref 11.5–15.5)
WBC: 6.5 10*3/uL (ref 4.0–10.5)

## 2013-04-15 ENCOUNTER — Ambulatory Visit (INDEPENDENT_AMBULATORY_CARE_PROVIDER_SITE_OTHER): Payer: Medicare Other | Admitting: Cardiology

## 2013-04-15 ENCOUNTER — Other Ambulatory Visit: Payer: Medicare Other

## 2013-04-15 ENCOUNTER — Encounter: Payer: Self-pay | Admitting: Cardiology

## 2013-04-15 VITALS — BP 164/70 | HR 55 | Wt 131.0 lb

## 2013-04-15 DIAGNOSIS — I509 Heart failure, unspecified: Secondary | ICD-10-CM | POA: Diagnosis not present

## 2013-04-15 DIAGNOSIS — I4891 Unspecified atrial fibrillation: Secondary | ICD-10-CM | POA: Diagnosis not present

## 2013-04-15 DIAGNOSIS — I5032 Chronic diastolic (congestive) heart failure: Secondary | ICD-10-CM | POA: Diagnosis not present

## 2013-04-15 LAB — CBC WITH DIFFERENTIAL/PLATELET
Basophils Relative: 0.7 % (ref 0.0–3.0)
Hemoglobin: 12.1 g/dL (ref 12.0–15.0)
Lymphocytes Relative: 20.9 % (ref 12.0–46.0)
Monocytes Relative: 9.5 % (ref 3.0–12.0)
Neutro Abs: 4 10*3/uL (ref 1.4–7.7)
RBC: 3.95 Mil/uL (ref 3.87–5.11)
WBC: 6.1 10*3/uL (ref 4.5–10.5)

## 2013-04-15 LAB — BASIC METABOLIC PANEL
BUN: 28 mg/dL — ABNORMAL HIGH (ref 6–23)
CO2: 26 mEq/L (ref 19–32)
Chloride: 105 mEq/L (ref 96–112)
Creatinine, Ser: 1.5 mg/dL — ABNORMAL HIGH (ref 0.4–1.2)
Potassium: 4.3 mEq/L (ref 3.5–5.1)

## 2013-04-15 MED ORDER — RIVAROXABAN 15 MG PO TABS: 15.0000 mg | ORAL_TABLET | Freq: Every day | ORAL | Status: DC

## 2013-04-15 MED ORDER — METOPROLOL SUCCINATE ER 50 MG PO TB24: 50.0000 mg | ORAL_TABLET | Freq: Every day | ORAL | Status: DC

## 2013-04-15 MED ORDER — FUROSEMIDE 40 MG PO TABS: ORAL_TABLET | ORAL | Status: DC

## 2013-04-15 NOTE — Patient Instructions (Addendum)
Your physician recommends that you schedule a follow-up appointment in: 3 MONTHS WITH DR [facility]  INCREASE FUROSEMIDE TO ONE TABLET IN THE MORNING AND ONE HALF TABLET IN THE AFTERNOON  Your physician recommends that you return for lab work TODAY AND IN 2 WEEKS

## 2013-04-17 NOTE — Progress Notes (Signed)
Patient ID: Melanie Blake, female   DOB: [birth year], 79 y.o.   MRN: [id number] 79 yo with minimal past history was admitted to [facility] in 4/14 with complaints of weakness and dyspnea.  She was found to have atrial fibrillation with RVR in the 140s.  She was noted to have acute on chronic diastolic CHF.  She was rate-controlled and TEE was attempted pre-cardioversion.  I was unable to pass the probe for TEE.  Therefore, I waited 4 weeks and cardioverted her to NSR in 6/14.  At followup appointment after this, she was back in atrial fibrillation.  However, today she is again in sinus rhythm.   She seems to be doing well symptomatically.  No exertional dyspnea.  She has been doing housework.  She is able to walk to her niece's house.  No chest pain.  She is overall not very active. No falls. Weight is up 5 lbs.   Labs (4/14): K 4.4, creatinine 1.3 => 1.4, HCT 42.7 Labs (6/14): K 4, creatinine 1.35  ECG: NSR, normal  PMH: 1. Atrial fibrillation: Paroxysmal.  Diagnosed in 4/14.  DCCV to NSR in 6/14.  2. Chronic diastolic CHF: Echo (4/14) with EF "grossly normal," mild AI, mild MR, PA systolic pressure 40 mm Hg, mildly decreased RV systolic function.   SH: Single, lives alone in a townhouse, niece lives nearby, nonsmoker.   FH: No CAD.    ROS: All systems reviewed and negative except as per HPI.   Current Outpatient Prescriptions  Medication Sig Dispense Refill  . diltiazem (CARDIZEM CD) 240 MG 24 hr capsule Take 1 capsule (240 mg total) by mouth daily.  30 capsule  11  . furosemide (LASIX) 40 MG tablet TAKE ONE TABLET IN THJE MORNING AND ONE HALF TABLET IN THE AFTERNOON  45 tablet  11  . metoprolol succinate (TOPROL-XL) 50 MG 24 hr tablet Take 1 tablet (50 mg total) by mouth daily. Take with or immediately following a meal.  30 tablet  6  . potassium chloride (K-DUR) 10 MEQ tablet Take 1 tablet (10 mEq total) by mouth daily.  30 tablet  11  . Rivaroxaban (XARELTO) 15 MG TABS tablet Take 1  tablet (15 mg total) by mouth daily.  30 tablet  6   No current facility-administered medications for this visit.    BP 164/70  Pulse 55  Wt 59.421 kg (131 lb)  BMI 39.99 kg/m2 General: NAD Neck: JVP 8-9 cm, no thyromegaly or thyroid nodule.  Lungs: Clear to auscultation bilaterally with normal respiratory effort. CV: Nondisplaced PMI.  Heart regular S1/S2, no S3/S4, no murmur.  1+ edema 1/2 up lower legs bilaterally, R>L.  No carotid bruit.  Normal pedal pulses.  Abdomen: Soft, nontender, no hepatosplenomegaly, no distention.  Skin: Venous stasis dermatitis lower legs.   Neurologic: Alert and oriented x 3.  Psych: Normal affect. Extremities: No clubbing or cyanosis.   Assessment/Plan: 1. Atrial fibrillation:  She is now back in NSR.  No falls, doing well on Xarelto. - Continue Xarelto 15 mg daily, CBC/BMET today.  - Continue diltiazem CD and Toprol XL.   2. Chronic diastolic CHF: NYHA class II symptoms.  She seems a bit volume overloaded by exam and weight is up 5 lbs.  I will increase Lasix to 40 qam, 20 qpm with BMET today and again in 2 wks.   Marca Ancona 04/17/2013

## 2013-04-29 ENCOUNTER — Ambulatory Visit: Payer: Medicare Other | Admitting: *Deleted

## 2013-04-29 DIAGNOSIS — I4891 Unspecified atrial fibrillation: Secondary | ICD-10-CM

## 2013-04-29 DIAGNOSIS — I5031 Acute diastolic (congestive) heart failure: Secondary | ICD-10-CM | POA: Diagnosis not present

## 2013-04-29 DIAGNOSIS — I509 Heart failure, unspecified: Secondary | ICD-10-CM | POA: Diagnosis not present

## 2013-04-29 LAB — CBC WITH DIFFERENTIAL/PLATELET
Eosinophils Absolute: 0.2 10*3/uL (ref 0.0–0.7)
Eosinophils Relative: 3 % (ref 0–5)
HCT: 36.2 % (ref 36.0–46.0)
Lymphs Abs: 1.8 10*3/uL (ref 0.7–4.0)
MCH: 30.6 pg (ref 26.0–34.0)
MCV: 90 fL (ref 78.0–100.0)
Monocytes Absolute: 0.5 10*3/uL (ref 0.1–1.0)
Platelets: 265 10*3/uL (ref 150–400)
RBC: 4.02 MIL/uL (ref 3.87–5.11)

## 2013-04-30 LAB — BASIC METABOLIC PANEL
BUN: 27 mg/dL — ABNORMAL HIGH (ref 6–23)
CO2: 27 mEq/L (ref 19–32)
Calcium: 9.6 mg/dL (ref 8.4–10.5)
Creat: 1.4 mg/dL — ABNORMAL HIGH (ref 0.50–1.10)
Glucose, Bld: 96 mg/dL (ref 70–99)

## 2013-05-27 DIAGNOSIS — Z23 Encounter for immunization: Secondary | ICD-10-CM | POA: Diagnosis not present

## 2013-07-18 ENCOUNTER — Ambulatory Visit (INDEPENDENT_AMBULATORY_CARE_PROVIDER_SITE_OTHER): Payer: Medicare Other | Admitting: Cardiology

## 2013-07-18 ENCOUNTER — Encounter: Payer: Self-pay | Admitting: Cardiology

## 2013-07-18 VITALS — BP 128/72 | HR 50 | Ht <= 58 in | Wt 124.0 lb

## 2013-07-18 DIAGNOSIS — I5032 Chronic diastolic (congestive) heart failure: Secondary | ICD-10-CM

## 2013-07-18 DIAGNOSIS — I509 Heart failure, unspecified: Secondary | ICD-10-CM | POA: Diagnosis not present

## 2013-07-18 DIAGNOSIS — I4891 Unspecified atrial fibrillation: Secondary | ICD-10-CM

## 2013-07-18 LAB — BASIC METABOLIC PANEL
BUN: 38 mg/dL — ABNORMAL HIGH (ref 6–23)
Calcium: 9.7 mg/dL (ref 8.4–10.5)
Chloride: 104 mEq/L (ref 96–112)
Creatinine, Ser: 1.6 mg/dL — ABNORMAL HIGH (ref 0.4–1.2)
GFR: 31.89 mL/min — ABNORMAL LOW (ref >60.00)

## 2013-07-18 MED ORDER — RIVAROXABAN 15 MG PO TABS: 15.0000 mg | ORAL_TABLET | Freq: Every day | ORAL | Status: DC

## 2013-07-18 MED ORDER — POTASSIUM CHLORIDE ER 10 MEQ PO TBCR: 10.0000 meq | EXTENDED_RELEASE_TABLET | Freq: Every day | ORAL | Status: DC

## 2013-07-18 MED ORDER — FUROSEMIDE 40 MG PO TABS: ORAL_TABLET | ORAL | Status: DC

## 2013-07-18 MED ORDER — METOPROLOL SUCCINATE ER 25 MG PO TB24: 25.0000 mg | ORAL_TABLET | Freq: Every day | ORAL | Status: DC

## 2013-07-18 MED ORDER — DILTIAZEM HCL ER COATED BEADS 240 MG PO CP24: 240.0000 mg | ORAL_CAPSULE | Freq: Every day | ORAL | Status: DC

## 2013-07-18 NOTE — Progress Notes (Signed)
Patient ID: Melanie Blake, female   DOB: 10/25/1923, 79 y.o.   MRN: [id number] 79 yo with minimal past history was admitted to [facility] in 4/14 with complaints of weakness and dyspnea.  She was found to have atrial fibrillation with RVR in the 140s.  She was noted to have acute on chronic diastolic CHF.  She was rate-controlled and TEE was attempted pre-cardioversion.  I was unable to pass the probe for TEE.  Therefore, I waited 4 weeks and cardioverted her to NSR in 6/14.  At followup appointment after this, she was back in atrial fibrillation.  However, today she is again in sinus rhythm.   She seems to be doing well symptomatically.  No exertional dyspnea.  She has been doing housework.  She is able to walk to her niece's house.  No falls.  No chest pain.  She is overall not very active. No falls. Weight is down 7 lbs since last appointment now that she is on a higher Lasix dose.    Labs (4/14): K 4.4, creatinine 1.3 => 1.4, HCT 42.7 Labs (6/14): K 4, creatinine 1.35 Labs (10/14): K 3.9, creatinine 1.4, HCT 36.2  ECG: NSR with inferior TWIs  PMH: 1. Atrial fibrillation: Paroxysmal.  Diagnosed in 4/14.  DCCV to NSR in 6/14.  2. Chronic diastolic CHF: Echo (4/14) with EF "grossly normal," mild AI, mild MR, PA systolic pressure 40 mm Hg, mildly decreased RV systolic function.  3. CKD  SH: Single, lives alone in a townhouse, niece lives nearby, nonsmoker.   FH: No CAD.    Current Outpatient Prescriptions  Medication Sig Dispense Refill  . diltiazem (CARDIZEM CD) 240 MG 24 hr capsule Take 1 capsule (240 mg total) by mouth daily.  30 capsule  11  . furosemide (LASIX) 40 MG tablet TAKE ONE TABLET IN THJE MORNING AND ONE HALF TABLET IN THE AFTERNOON  45 tablet  11  . metoprolol succinate (TOPROL-XL) 25 MG 24 hr tablet Take 1 tablet (25 mg total) by mouth daily. Take with or immediately following a meal.  30 tablet  11  . potassium chloride (K-DUR) 10 MEQ tablet Take 1 tablet (10 mEq total) by  mouth daily.  30 tablet  11  . Rivaroxaban (XARELTO) 15 MG TABS tablet Take 1 tablet (15 mg total) by mouth daily.  30 tablet  11   No current facility-administered medications for this visit.    BP 128/72  Pulse 50  Ht 4' (1.219 m)  Wt 56.246 kg (124 lb)  BMI 37.85 kg/m2 General: NAD Neck: JVP 7 cm, no thyromegaly or thyroid nodule.  Lungs: Slight bibasilar crackles. CV: Nondisplaced PMI.  Heart regular S1/S2, no S3/S4, no murmur.  Trace ankle edema.  No carotid bruit.  Normal pedal pulses.  Abdomen: Soft, nontender, no hepatosplenomegaly, no distention.  Skin: Venous stasis dermatitis lower legs.   Neurologic: Alert and oriented x 3.  Psych: Normal affect. Extremities: No clubbing or cyanosis.   Assessment/Plan: 1. Atrial fibrillation:  She remains in NSR.  No falls, doing well on Xarelto. - Continue Xarelto 15 mg daily.  - Continue diltiazem CD and Toprol XL (but will decrease Toprol XL to 25 mg daily given HR 50).   2. Chronic diastolic CHF: NYHA class II symptoms.  Volume status looks good on higher Lasix dose.  Repeat BMET today.   Marca Ancona 07/18/2013

## 2013-07-18 NOTE — Patient Instructions (Signed)
Decrease Metoprolol to 25mg  daily. An Rx has been sent to your pharmacy  Lab today: bmet  Your physician wants you to follow-up in: 4 months You will receive a reminder letter in the mail two months in advance. If you don't receive a letter, please call our office to schedule the follow-up appointment.

## 2013-08-12 ENCOUNTER — Ambulatory Visit (INDEPENDENT_AMBULATORY_CARE_PROVIDER_SITE_OTHER): Payer: Medicare Other | Admitting: *Deleted

## 2013-08-12 DIAGNOSIS — I4891 Unspecified atrial fibrillation: Secondary | ICD-10-CM | POA: Diagnosis not present

## 2013-08-12 LAB — CBC WITH DIFFERENTIAL/PLATELET
BASOS PCT: 1 % (ref 0–1)
Basophils Absolute: 0 10*3/uL (ref 0.0–0.1)
EOS ABS: 0.2 10*3/uL (ref 0.0–0.7)
Eosinophils Relative: 3 % (ref 0–5)
HCT: 36.5 % (ref 36.0–46.0)
Hemoglobin: 12.2 g/dL (ref 12.0–15.0)
Lymphocytes Relative: 23 % (ref 12–46)
Lymphs Abs: 1.8 10*3/uL (ref 0.7–4.0)
MCH: 29.3 pg (ref 26.0–34.0)
MCHC: 33.4 g/dL (ref 30.0–36.0)
MCV: 87.7 fL (ref 78.0–100.0)
MONOS PCT: 10 % (ref 3–12)
Monocytes Absolute: 0.8 10*3/uL (ref 0.1–1.0)
NEUTROS PCT: 63 % (ref 43–77)
Neutro Abs: 4.9 10*3/uL (ref 1.7–7.7)
PLATELETS: 229 10*3/uL (ref 150–400)
RBC: 4.16 MIL/uL (ref 3.87–5.11)
RDW: 14.3 % (ref 11.5–15.5)
WBC: 7.8 10*3/uL (ref 4.0–10.5)

## 2013-08-12 LAB — BASIC METABOLIC PANEL
BUN: 34 mg/dL — AB (ref 6–23)
CHLORIDE: 102 meq/L (ref 96–112)
CO2: 27 meq/L (ref 19–32)
CREATININE: 1.6 mg/dL — AB (ref 0.50–1.10)
Calcium: 9.7 mg/dL (ref 8.4–10.5)
GLUCOSE: 101 mg/dL — AB (ref 70–99)
Potassium: 4.2 mEq/L (ref 3.5–5.3)
Sodium: 139 mEq/L (ref 135–145)

## 2013-08-12 NOTE — Progress Notes (Signed)
Pt was started on Xarelto for Afib on 12/2012.    Reviewed patients medication list.  Pt is not currently on any combined P-gp and strong CYP3A4 inhibitors/inducers (ketoconazole, traconazole, ritonavir, carbamazepine, phenytoin, rifampin, St. John's wort).  Reviewed labs.  SCr 1.6, Weight 56Kg, CrCl- 21.89.  Dose appropriate based on CrCl.   Hgb and HCT 12.2/36.5.  A full discussion of the nature of anticoagulants has been carried out.  A benefit/risk analysis has been presented to the patient, so that they understand the justification for choosing anticoagulation with Xarelto at this time.  The need for compliance is stressed.  Pt is aware to take the medication once daily with the largest meal of the day.  Side effects of potential bleeding are discussed, including unusual colored urine or stools, coughing up blood or coffee ground emesis, nose bleeds or serious fall or head trauma.  Discussed signs and symptoms of stroke. The patient should avoid any OTC items containing aspirin or ibuprofen.  Avoid alcohol consumption.   Call if any signs of abnormal bleeding.  Discussed financial obligations and resolved any difficulty in obtaining medication.  Next lab test test in 3 months.

## 2013-11-11 ENCOUNTER — Ambulatory Visit (INDEPENDENT_AMBULATORY_CARE_PROVIDER_SITE_OTHER): Payer: Medicare Other | Admitting: Cardiology

## 2013-11-11 ENCOUNTER — Ambulatory Visit (INDEPENDENT_AMBULATORY_CARE_PROVIDER_SITE_OTHER): Payer: Medicare Other | Admitting: *Deleted

## 2013-11-11 ENCOUNTER — Encounter: Payer: Self-pay | Admitting: Cardiology

## 2013-11-11 VITALS — BP 118/72 | Ht <= 58 in | Wt 123.0 lb

## 2013-11-11 DIAGNOSIS — I509 Heart failure, unspecified: Secondary | ICD-10-CM

## 2013-11-11 DIAGNOSIS — I5032 Chronic diastolic (congestive) heart failure: Secondary | ICD-10-CM | POA: Diagnosis not present

## 2013-11-11 DIAGNOSIS — I4891 Unspecified atrial fibrillation: Secondary | ICD-10-CM

## 2013-11-11 DIAGNOSIS — N189 Chronic kidney disease, unspecified: Secondary | ICD-10-CM | POA: Diagnosis not present

## 2013-11-11 LAB — CBC
HEMATOCRIT: 36.7 % (ref 36.0–46.0)
Hemoglobin: 12 g/dL (ref 12.0–15.0)
MCHC: 32.7 g/dL (ref 30.0–36.0)
MCV: 88.2 fl (ref 78.0–100.0)
Platelets: 231 10*3/uL (ref 150.0–400.0)
RBC: 4.16 Mil/uL (ref 3.87–5.11)
RDW: 14.6 % (ref 11.5–14.6)
WBC: 5.6 10*3/uL (ref 4.5–10.5)

## 2013-11-11 MED ORDER — DILTIAZEM HCL ER COATED BEADS 120 MG PO CP24: 120.0000 mg | ORAL_CAPSULE | Freq: Every day | ORAL | Status: DC

## 2013-11-11 NOTE — Patient Instructions (Signed)
**Note De-Identified  Obfuscation** Your physician has recommended you make the following change in your medication: decrease Diltiazem 120 mg daily  Your physician recommends that you return for lab work in: today   Your physician wants you to follow-up in: 6 months. You will receive a reminder letter in the mail two months in advance. If you don't receive a letter, please call our office to schedule the follow-up appointment.

## 2013-11-11 NOTE — Patient Instructions (Signed)
A full discussion of the nature of anticoagulants has been carried out.  A benefit/risk analysis has been presented to the patient, so that they understand the justification for choosing anticoagulation with Xarelto at this time.  The need for compliance is stressed.  Pt is aware to take the medication once daily with the largest meal of the day.  Side effects of potential bleeding are discussed, including unusual colored urine or stools, coughing up blood or coffee ground emesis, nose bleeds or serious fall or head trauma.  Discussed signs and symptoms of stroke. The patient should avoid any OTC items containing aspirin or ibuprofen.  Avoid alcohol consumption.   Call if any signs of abnormal bleeding.  Discussed financial obligations and resolved any difficulty in obtaining medication.  Next lab test test in ____ months.

## 2013-11-11 NOTE — Progress Notes (Signed)
Pt was started on Xarelto for Afib on 12/2012.    Reviewed patients medication list.  Pt is not  currently on any combined P-gp and strong CYP3A4 inhibitors/inducers (ketoconazole, traconazole, ritonavir, carbamazepine, phenytoin, rifampin, St. John's wort).  Reviewed labs.  SCr 1.5, Weight 55.7Kg, CrCl- 22.79.  Dose is appropriate based on CrCl.   Hgb and HCT 12.0/36.7.   A full discussion of the nature of anticoagulants has been carried out.  A benefit/risk analysis has been presented to the patient, so that they understand the justification for choosing anticoagulation with Xarelto at this time.  The need for compliance is stressed.  Pt is aware to take the medication once daily with the largest meal of the day.  Side effects of potential bleeding are discussed, including unusual colored urine or stools, coughing up blood or coffee ground emesis, nose bleeds or serious fall or head trauma.  Discussed signs and symptoms of stroke. The patient should avoid any OTC items containing aspirin or ibuprofen.  Avoid alcohol consumption.   Call if any signs of abnormal bleeding.  Discussed financial obligations and resolved any difficulty in obtaining medication.  Next lab test test in 3 months.

## 2013-11-13 DIAGNOSIS — N183 Chronic kidney disease, stage 3 unspecified: Secondary | ICD-10-CM | POA: Insufficient documentation

## 2013-11-13 NOTE — Progress Notes (Signed)
Patient ID: Melanie Blake, female   DOB: 10/01/1923, 80 y.o.   MRN: [id number] 80 yo with minimal past history was admitted to [facility] in 4/14 with complaints of weakness and dyspnea.  She was found to have atrial fibrillation with RVR in the 140s.  She was noted to have acute on chronic diastolic CHF.  She was rate-controlled and TEE was attempted pre-cardioversion.  I was unable to pass the probe for TEE.  Therefore, I waited 4 weeks and cardioverted her to NSR in 6/14.  At followup appointment after this, she was back in atrial fibrillation.  However, today she is again in sinus rhythm.   She seems to be doing well symptomatically.  No exertional dyspnea.  She has been doing housework.  She is able to walk to her niece's house.  She can carry her garbage to the dumpster without difficulty.  No falls.  No chest pain. Weight is down 1 lb since last appointment.  HR is running low (upper 40s)  Labs (4/14): K 4.4, creatinine 1.3 => 1.4, HCT 42.7 Labs (6/14): K 4, creatinine 1.35 Labs (10/14): K 3.9, creatinine 1.4, HCT 36.2 Labs (1/15): K 4.2, creatinine 1.6, HCT 36.5  ECG: NSR at 49, baseline artifact  PMH: 1. Atrial fibrillation: Paroxysmal.  Diagnosed in 4/14.  DCCV to NSR in 6/14.  2. Chronic diastolic CHF: Echo (4/14) with EF "grossly normal," mild AI, mild MR, PA systolic pressure 40 mm Hg, mildly decreased RV systolic function.  3. CKD  SH: Single, lives alone in a townhouse, niece lives nearby, nonsmoker.   FH: No CAD.    ROS: All systems reviewed and negative except as per HPI.  Current Outpatient Prescriptions  Medication Sig Dispense Refill  . diltiazem (CARDIZEM CD) 120 MG 24 hr capsule Take 1 capsule (120 mg total) by mouth daily.  30 capsule  11  . furosemide (LASIX) 40 MG tablet TAKE ONE TABLET IN THJE MORNING AND ONE HALF TABLET IN THE AFTERNOON  45 tablet  11  . metoprolol succinate (TOPROL-XL) 25 MG 24 hr tablet Take 1 tablet (25 mg total) by mouth daily. Take with or  immediately following a meal.  30 tablet  11  . potassium chloride (K-DUR) 10 MEQ tablet Take 1 tablet (10 mEq total) by mouth daily.  30 tablet  11  . Rivaroxaban (XARELTO) 15 MG TABS tablet Take 1 tablet (15 mg total) by mouth daily.  30 tablet  11   No current facility-administered medications for this visit.    BP 118/72  Ht 4' (1.219 m)  Wt 55.792 kg (123 lb)  BMI 37.55 kg/m2 General: NAD Neck: JVP 7-8 cm, no thyromegaly or thyroid nodule.  Lungs: Slight bibasilar crackles. CV: Nondisplaced PMI.  Heart regular S1/S2, no S3/S4, no murmur.  1+ bilateral ankle edema.  No carotid bruit.  Normal pedal pulses.  Abdomen: Soft, nontender, no hepatosplenomegaly, no distention.  Skin: Venous stasis dermatitis lower legs.   Neurologic: Alert and oriented x 3.  Psych: Normal affect. Extremities: No clubbing or cyanosis.   Assessment/Plan: 1. Atrial fibrillation:  She remains in NSR.  No falls, doing well on Xarelto. - Continue Xarelto 15 mg daily.  - Continue Toprol XL, decrease diltiazem CD to 120 mg daily.  2. Chronic diastolic CHF: NYHA class II symptoms.  Volume status looks good on current Lasix dose.  3. CKD: Repeat BMET today.   Followup 6 months.   Melanie Blake 11/13/2013

## 2013-11-14 LAB — BASIC METABOLIC PANEL
BUN: 27 mg/dL — ABNORMAL HIGH (ref 6–23)
CALCIUM: 9.7 mg/dL (ref 8.4–10.5)
CO2: 25 meq/L (ref 19–32)
Chloride: 104 mEq/L (ref 96–112)
Creatinine, Ser: 1.5 mg/dL — ABNORMAL HIGH (ref 0.4–1.2)
GFR: 34.56 mL/min — AB (ref >60.00)
Glucose, Bld: 87 mg/dL (ref 70–99)
Potassium: 3.7 mEq/L (ref 3.5–5.1)
SODIUM: 140 meq/L (ref 135–145)

## 2014-02-17 ENCOUNTER — Ambulatory Visit (INDEPENDENT_AMBULATORY_CARE_PROVIDER_SITE_OTHER): Payer: Medicare Other | Admitting: *Deleted

## 2014-02-17 DIAGNOSIS — I4891 Unspecified atrial fibrillation: Secondary | ICD-10-CM

## 2014-02-17 DIAGNOSIS — I480 Paroxysmal atrial fibrillation: Secondary | ICD-10-CM

## 2014-02-18 LAB — BASIC METABOLIC PANEL
BUN: 18 mg/dL (ref 6–23)
CHLORIDE: 99 meq/L (ref 96–112)
CO2: 26 mEq/L (ref 19–32)
Calcium: 9.5 mg/dL (ref 8.4–10.5)
Creat: 1.47 mg/dL — ABNORMAL HIGH (ref 0.50–1.10)
Glucose, Bld: 89 mg/dL (ref 70–99)
POTASSIUM: 3.6 meq/L (ref 3.5–5.3)
Sodium: 138 mEq/L (ref 135–145)

## 2014-02-22 NOTE — Progress Notes (Signed)
Pt was started on Xarelto for Afib on 12/2012.    Reviewed patients medication list.  Pt is not  currently on any combined P-gp and strong CYP3A4 inhibitors/inducers (ketoconazole, traconazole, ritonavir, carbamazepine, phenytoin, rifampin, St. John's wort).  Reviewed labs.  SCr 1.47, Weight 55.7Kg, CrCl- 23.  Dose is appropriate based on CrCl.   Hgb and HCT 12.0/36.7.   A full discussion of the nature of anticoagulants has been carried out.  A benefit/risk analysis has been presented to the patient, so that they understand the justification for choosing anticoagulation with Xarelto at this time.  The need for compliance is stressed.  Pt is aware to take the medication once daily with the largest meal of the day.  Side effects of potential bleeding are discussed, including unusual colored urine or stools, coughing up blood or coffee ground emesis, nose bleeds or serious fall or head trauma.  Discussed signs and symptoms of stroke. The patient should avoid any OTC items containing aspirin or ibuprofen.  Avoid alcohol consumption.   Call if any signs of abnormal bleeding.  Discussed financial obligations and resolved any difficulty in obtaining medication.  Next lab test test in 3 months with Dr. Shirlee LatchMcLean appt.

## 2014-04-30 DIAGNOSIS — Z23 Encounter for immunization: Secondary | ICD-10-CM | POA: Diagnosis not present

## 2014-05-12 ENCOUNTER — Encounter: Payer: Self-pay | Admitting: *Deleted

## 2014-05-15 ENCOUNTER — Ambulatory Visit (INDEPENDENT_AMBULATORY_CARE_PROVIDER_SITE_OTHER): Payer: Medicare Other | Admitting: Cardiology

## 2014-05-15 ENCOUNTER — Encounter: Payer: Self-pay | Admitting: Cardiology

## 2014-05-15 VITALS — BP 160/80 | HR 82 | Ht 60.0 in | Wt 115.8 lb

## 2014-05-15 DIAGNOSIS — I480 Paroxysmal atrial fibrillation: Secondary | ICD-10-CM

## 2014-05-15 DIAGNOSIS — N183 Chronic kidney disease, stage 3 (moderate): Secondary | ICD-10-CM | POA: Diagnosis not present

## 2014-05-15 DIAGNOSIS — I5032 Chronic diastolic (congestive) heart failure: Secondary | ICD-10-CM

## 2014-05-15 MED ORDER — METOPROLOL SUCCINATE ER 50 MG PO TB24: 50.0000 mg | ORAL_TABLET | Freq: Every day | ORAL | Status: DC

## 2014-05-15 NOTE — Patient Instructions (Signed)
Increase Toprol XL (metoprolol succinate) to 50mg  daily. You can take 2 of your 25mg  tablets at the same time and use your current supply.  Your physician recommends that you return for lab work on November 5,2015--the lab is open from 7:30AM-5PM.  Your physician recommends that you schedule a follow-up appointment in: 4 months with Dr Shirlee LatchMcLean.

## 2014-05-15 NOTE — Progress Notes (Signed)
Patient ID: Melanie PedroBettie M Blake, female   DOB: 08/29/1923, 80 y.o.   MRN: [id number] 80 yo with minimal past history was admitted [facility] in 4/14 with complaints of weakness and dyspnea.  She was found to have atrial fibrillation with RVR in the 140s.  She was noted to have acute on chronic diastolic CHF.  She was rate-controlled and TEE was attempted pre-cardioversion.  I was unable to pass the probe for TEE.  Therefore, I waited 4 weeks and cardioverted her to NSR in 6/14.  At followup appointment after this, she was back in atrial fibrillation but spontaneously converted to NSR.  Today, she is back in rate-controlled atrial fibrillation.  She seems to be doing well symptomatically.  No exertional dyspnea.  She has been doing housework.  She is able to walk to her niece's house.  She can carry her garbage to the dumpster without difficulty.  No falls or lightheadedness.  No chest pain. Weight is down 8 lbs since last appointment.  She does not realize that she is back in atrial fibrillation.   Labs (4/14): K 4.4, creatinine 1.3 => 1.4, HCT 42.7 Labs (6/14): K 4, creatinine 1.35 Labs (10/14): K 3.9, creatinine 1.4, HCT 36.2 Labs (1/15): K 4.2, creatinine 1.6, HCT 36.5 Labs (4/15): HCT 36.7 Labs (7/15): K 3.6, creatinine 1.47  ECG: Atrial fibrillation at 84 with RBBB  PMH: 1. Atrial fibrillation: Paroxysmal.  Diagnosed in 4/14.  DCCV to NSR in 6/14. Back in atrial fibrillation in 10/15.  2. Chronic diastolic CHF: Echo (4/14) with EF "grossly normal," mild AI, mild MR, PA systolic pressure 40 mm Hg, mildly decreased RV systolic function.  3. CKD  SH: Single, lives alone in a townhouse, niece lives nearby, nonsmoker.   FH: No CAD.    ROS: All systems reviewed and negative except as per HPI.  Current Outpatient Prescriptions  Medication Sig Dispense Refill  . diltiazem (CARDIZEM CD) 120 MG 24 hr capsule Take 1 capsule (120 mg total) by mouth daily.  30 capsule  11  . furosemide (LASIX) 40  MG tablet TAKE ONE TABLET IN THJE MORNING AND ONE HALF TABLET IN THE AFTERNOON  45 tablet  11  . potassium chloride (K-DUR) 10 MEQ tablet Take 1 tablet (10 mEq total) by mouth daily.  30 tablet  11  . Rivaroxaban (XARELTO) 15 MG TABS tablet Take 1 tablet (15 mg total) by mouth daily.  30 tablet  11  . metoprolol succinate (TOPROL-XL) 50 MG 24 hr tablet Take 1 tablet (50 mg total) by mouth daily. Take with or immediately following a meal.  30 tablet  6   No current facility-administered medications for this visit.    BP 160/80  Pulse 82  Ht 5' (1.524 m)  Wt 115 lb 12.8 oz (52.527 kg)  BMI 22.62 kg/m2 General: NAD Neck: JVP 7 cm, no thyromegaly or thyroid nodule.  Lungs: Slight bibasilar crackles. CV: Nondisplaced PMI.  Heart irregular S1/S2, no S3/S4, 1/6 SEM RUSB.  1+ bilateral ankle edema.  No carotid bruit.  Normal pedal pulses.  Abdomen: Soft, nontender, no hepatosplenomegaly, no distention.  Skin: Venous stasis dermatitis lower legs.   Neurologic: Alert and oriented x 3.  Psych: Normal affect. Extremities: No clubbing or cyanosis.   Assessment/Plan: 1. Atrial fibrillation:  She is back in atrial fibrillation today.  However, she does not seem to be particularly symptomatic.  We talked about whether to leave her in atrial fibrillation or try DCCV again.  Given  the lack of prominent symptoms, we decided on rate control and anticoagulation.   - Continue Xarelto 15 mg daily.  - Continue diltiazem CD, increase Toprol XL to 50 mg daily.   - Check CBC today on Xarelto.  2. Chronic diastolic CHF: NYHA class II symptoms.  Volume status looks good on current Lasix dose.  3. CKD: Repeat BMET today.   Followup 4 months.   Marca AnconaDalton Kadedra Vanaken 05/15/2014

## 2014-05-25 ENCOUNTER — Other Ambulatory Visit (INDEPENDENT_AMBULATORY_CARE_PROVIDER_SITE_OTHER): Payer: Medicare Other | Admitting: *Deleted

## 2014-05-25 DIAGNOSIS — I480 Paroxysmal atrial fibrillation: Secondary | ICD-10-CM

## 2014-05-26 ENCOUNTER — Ambulatory Visit: Payer: Medicare Other | Admitting: Cardiology

## 2014-05-26 LAB — BASIC METABOLIC PANEL
BUN: 18 mg/dL (ref 6–23)
CALCIUM: 9 mg/dL (ref 8.4–10.5)
CO2: 26 mEq/L (ref 19–32)
Chloride: 103 mEq/L (ref 96–112)
Creatinine, Ser: 1.6 mg/dL — ABNORMAL HIGH (ref 0.4–1.2)
GFR: 32.76 mL/min — AB (ref >60.00)
Glucose, Bld: 93 mg/dL (ref 70–99)
Potassium: 3.7 mEq/L (ref 3.5–5.1)
SODIUM: 138 meq/L (ref 135–145)

## 2014-05-26 LAB — CBC WITH DIFFERENTIAL/PLATELET
BASOS PCT: 0.5 % (ref 0.0–3.0)
Basophils Absolute: 0 10*3/uL (ref 0.0–0.1)
EOS ABS: 0.1 10*3/uL (ref 0.0–0.7)
Eosinophils Relative: 2 % (ref 0.0–5.0)
HEMATOCRIT: 37.2 % (ref 36.0–46.0)
HEMOGLOBIN: 12.1 g/dL (ref 12.0–15.0)
LYMPHS ABS: 1.7 10*3/uL (ref 0.7–4.0)
Lymphocytes Relative: 30.8 % (ref 12.0–46.0)
MCHC: 32.6 g/dL (ref 30.0–36.0)
MCV: 91.1 fl (ref 78.0–100.0)
MONO ABS: 0.4 10*3/uL (ref 0.1–1.0)
Monocytes Relative: 7.7 % (ref 3.0–12.0)
NEUTROS ABS: 3.4 10*3/uL (ref 1.4–7.7)
Neutrophils Relative %: 59 % (ref 43.0–77.0)
Platelets: 206 10*3/uL (ref 150.0–400.0)
RBC: 4.08 Mil/uL (ref 3.87–5.11)
RDW: 14.2 % (ref 11.5–15.5)
WBC: 5.7 10*3/uL (ref 4.0–10.5)

## 2014-05-30 ENCOUNTER — Other Ambulatory Visit: Payer: Self-pay

## 2014-05-30 MED ORDER — RIVAROXABAN 15 MG PO TABS: 15.0000 mg | ORAL_TABLET | Freq: Every day | ORAL | Status: DC

## 2014-05-30 MED ORDER — POTASSIUM CHLORIDE ER 10 MEQ PO TBCR: 10.0000 meq | EXTENDED_RELEASE_TABLET | Freq: Every day | ORAL | Status: DC

## 2014-07-27 ENCOUNTER — Other Ambulatory Visit: Payer: Self-pay | Admitting: Cardiology

## 2014-07-28 ENCOUNTER — Encounter (INDEPENDENT_AMBULATORY_CARE_PROVIDER_SITE_OTHER): Payer: Medicare Other | Admitting: Ophthalmology

## 2014-07-28 DIAGNOSIS — H3531 Nonexudative age-related macular degeneration: Secondary | ICD-10-CM

## 2014-07-28 DIAGNOSIS — H26492 Other secondary cataract, left eye: Secondary | ICD-10-CM | POA: Diagnosis not present

## 2014-07-28 DIAGNOSIS — H2511 Age-related nuclear cataract, right eye: Secondary | ICD-10-CM | POA: Diagnosis not present

## 2014-07-28 DIAGNOSIS — H43813 Vitreous degeneration, bilateral: Secondary | ICD-10-CM

## 2014-08-04 ENCOUNTER — Ambulatory Visit (INDEPENDENT_AMBULATORY_CARE_PROVIDER_SITE_OTHER): Payer: Medicare Other | Admitting: Ophthalmology

## 2014-08-04 DIAGNOSIS — H2702 Aphakia, left eye: Secondary | ICD-10-CM

## 2014-08-29 DIAGNOSIS — H3531 Nonexudative age-related macular degeneration: Secondary | ICD-10-CM | POA: Diagnosis not present

## 2014-08-29 DIAGNOSIS — Z961 Presence of intraocular lens: Secondary | ICD-10-CM | POA: Diagnosis not present

## 2014-08-29 DIAGNOSIS — H25011 Cortical age-related cataract, right eye: Secondary | ICD-10-CM | POA: Diagnosis not present

## 2014-08-29 DIAGNOSIS — H2511 Age-related nuclear cataract, right eye: Secondary | ICD-10-CM | POA: Diagnosis not present

## 2014-09-14 ENCOUNTER — Encounter: Payer: Self-pay | Admitting: Cardiology

## 2014-09-14 ENCOUNTER — Ambulatory Visit (INDEPENDENT_AMBULATORY_CARE_PROVIDER_SITE_OTHER): Payer: Medicare Other | Admitting: Cardiology

## 2014-09-14 VITALS — BP 128/80 | HR 76 | Ht 60.0 in | Wt 117.0 lb

## 2014-09-14 DIAGNOSIS — N183 Chronic kidney disease, stage 3 (moderate): Secondary | ICD-10-CM | POA: Diagnosis not present

## 2014-09-14 DIAGNOSIS — I480 Paroxysmal atrial fibrillation: Secondary | ICD-10-CM | POA: Diagnosis not present

## 2014-09-14 DIAGNOSIS — I482 Chronic atrial fibrillation, unspecified: Secondary | ICD-10-CM

## 2014-09-14 MED ORDER — FUROSEMIDE 40 MG PO TABS: ORAL_TABLET | ORAL | Status: DC

## 2014-09-14 MED ORDER — DILTIAZEM HCL ER COATED BEADS 120 MG PO CP24: 120.0000 mg | ORAL_CAPSULE | Freq: Every day | ORAL | Status: DC

## 2014-09-14 MED ORDER — RIVAROXABAN 15 MG PO TABS: 15.0000 mg | ORAL_TABLET | Freq: Every day | ORAL | Status: DC

## 2014-09-14 MED ORDER — METOPROLOL SUCCINATE ER 50 MG PO TB24: 50.0000 mg | ORAL_TABLET | Freq: Every day | ORAL | Status: DC

## 2014-09-14 MED ORDER — POTASSIUM CHLORIDE ER 10 MEQ PO TBCR: 10.0000 meq | EXTENDED_RELEASE_TABLET | Freq: Every day | ORAL | Status: DC

## 2014-09-14 NOTE — Patient Instructions (Addendum)
Your physician recommends that you have lab work today--BMET/BNP.  Dr Shirlee LatchMcLean has cleared you for cataract surgery. We will fax surgical clearance form to [facility]  Your physician wants you to follow-up in: 6 months with Dr Shirlee LatchMcLean. (August 2016).  You will receive a reminder letter in the mail two months in advance. If you don't receive a letter, please call our office to schedule the follow-up appointment.

## 2014-09-15 LAB — BASIC METABOLIC PANEL
BUN: 23 mg/dL (ref 6–23)
CALCIUM: 9.4 mg/dL (ref 8.4–10.5)
CHLORIDE: 102 meq/L (ref 96–112)
CO2: 28 meq/L (ref 19–32)
Creatinine, Ser: 1.41 mg/dL — ABNORMAL HIGH (ref 0.40–1.20)
GFR: 37.33 mL/min — ABNORMAL LOW (ref >60.00)
GLUCOSE: 92 mg/dL (ref 70–99)
Potassium: 4 mEq/L (ref 3.5–5.1)
SODIUM: 137 meq/L (ref 135–145)

## 2014-09-15 LAB — BRAIN NATRIURETIC PEPTIDE: Pro B Natriuretic peptide (BNP): 137 pg/mL — ABNORMAL HIGH (ref 0.0–100.0)

## 2014-09-16 NOTE — Progress Notes (Signed)
Patient ID: Melanie Blake, female   DOB: [birth year], 81 y.o.   MRN: [id number] 81 yo with minimal past history was admitted to [facility] in 4/14 with complaints of weakness and dyspnea.  She was found to have atrial fibrillation with RVR in the 140s.  She was noted to have acute on chronic diastolic CHF.  She was rate-controlled and TEE was attempted pre-cardioversion.  I was unable to pass the probe for TEE.  Therefore, I waited 4 weeks and cardioverted her to NSR in 6/14.  At followup appointment after this, she was back in atrial fibrillation but spontaneously converted to NSR.  At this appointment and last appointment, she has been back in atrial fibrillation without symptoms.   She seems to be doing well overall.  No exertional dyspnea.  She has been doing housework.  She is able to walk to her niece's house.  She can carry her garbage to the dumpster without difficulty.  No falls or lightheadedness.  No chest pain. Weight stable.  She does not realize that she is back in atrial fibrillation.   Labs (4/14): K 4.4, creatinine 1.3 => 1.4, HCT 42.7 Labs (6/14): K 4, creatinine 1.35 Labs (10/14): K 3.9, creatinine 1.4, HCT 36.2 Labs (1/15): K 4.2, creatinine 1.6, HCT 36.5 Labs (4/15): HCT 36.7 Labs (7/15): K 3.6, creatinine 1.47 Labs (11/15): K 3.7, creatinine 1.6, HCT 37.2  ECG: Atrial fibrillation, RBBB  PMH: 1. Atrial fibrillation: Paroxysmal.  Diagnosed in 4/14.  DCCV to NSR in 6/14. Back in atrial fibrillation in 10/15.  2. Chronic diastolic CHF: Echo (4/14) with EF "grossly normal," mild AI, mild MR, PA systolic pressure 40 mm Hg, mildly decreased RV systolic function.  3. CKD  SH: Single, lives alone in a townhouse, niece lives nearby, nonsmoker.   FH: No CAD.    ROS: All systems reviewed and negative except as per HPI.  Current Outpatient Prescriptions  Medication Sig Dispense Refill  . diltiazem (CARDIZEM CD) 120 MG 24 hr capsule Take 1 capsule (120 mg total) by mouth daily.  30 capsule 11  . furosemide (LASIX) 40 MG tablet TAKE 1 TABLET BY MOUTH EVERY MORNING AND 1/2 TABLET EVERY AFTERNOON AS DIRECTED 45 tablet 11  . metoprolol succinate (TOPROL-XL) 50 MG 24 hr tablet Take 1 tablet (50 mg total) by mouth daily. Take with or immediately following a meal. 30 tablet 11  . potassium chloride (K-DUR) 10 MEQ tablet Take 1 tablet (10 mEq total) by mouth daily. 30 tablet 11  . Rivaroxaban (XARELTO) 15 MG TABS tablet Take 1 tablet (15 mg total) by mouth daily. 30 tablet 11   No current facility-administered medications for this visit.    BP 128/80 mmHg  Pulse 76  Ht 5' (1.524 m)  Wt 117 lb (53.071 kg)  BMI 22.85 kg/m2 General: NAD Neck: JVP 7 cm, no thyromegaly or thyroid nodule.  Lungs: Slight bibasilar crackles. CV: Nondisplaced PMI.  Heart irregular S1/S2, no S3/S4, 1/6 SEM RUSB.  1+ bilateral ankle edema.  No carotid bruit.  Normal pedal pulses.  Abdomen: Soft, nontender, no hepatosplenomegaly, no distention.  Skin: Venous stasis dermatitis lower legs.   Neurologic: Alert and oriented x 3.  Psych: Normal affect. Extremities: No clubbing or cyanosis.   Assessment/Plan: 1. Atrial fibrillation:  She is back in atrial fibrillation today.  However, she does not seem to be particularly symptomatic.  Given the lack of prominent symptoms, we have decided on rate control and anticoagulation.   - Continue Xarelto  15 mg daily.  - Continue diltiazem CD and Toprol XL.  Rate control is reasonable.    2. Chronic diastolic CHF: NYHA class II symptoms.  Volume status looks good on current Lasix dose. Check BMET/BNP today. 3. CKD: Repeat BMET today.   Followup 6 months.   Marca AnconaDalton Tal Neer 09/16/2014

## 2014-09-18 ENCOUNTER — Telehealth: Payer: Self-pay | Admitting: Cardiology

## 2014-09-18 NOTE — Telephone Encounter (Signed)
Surgical clearance form faxed to [facility].

## 2014-10-06 DIAGNOSIS — H2589 Other age-related cataract: Secondary | ICD-10-CM | POA: Diagnosis not present

## 2014-10-06 DIAGNOSIS — H25811 Combined forms of age-related cataract, right eye: Secondary | ICD-10-CM | POA: Diagnosis not present

## 2014-10-06 DIAGNOSIS — H2511 Age-related nuclear cataract, right eye: Secondary | ICD-10-CM | POA: Diagnosis not present

## 2014-11-02 ENCOUNTER — Other Ambulatory Visit: Payer: Self-pay | Admitting: Cardiology

## 2014-12-13 ENCOUNTER — Telehealth: Payer: Self-pay | Admitting: Cardiology

## 2014-12-13 NOTE — Telephone Encounter (Signed)
I think this should be ok.

## 2014-12-13 NOTE — Telephone Encounter (Signed)
Pt c/o medication issue:  1. Name of Medication: Preservision Areds 2    2. How are you currently taking this medication (dosage and times per day)? Not taking it yet   3. Are you having a reaction (difficulty breathing--STAT)? Hasn't taken it yet   4. What is your medication issue? Pt daughter called states that the pt was recently prescribed a supliment for mascular degeneration. Request a call back to determine if there might be a reaction given the other medications that the pt is taking. Please call

## 2014-12-13 NOTE — Telephone Encounter (Signed)
Will route this question to both Dr. Shirlee LatchMcLean and to Audrie LiaSally Earl, PharmD regarding if newly prescribed medication for macular degeneration (Preservision Areds 2) is okay to take with her other medications prescribed by Dr. Shirlee LatchMcLean. Patient has not started taking it yet.

## 2014-12-14 NOTE — Telephone Encounter (Signed)
Notified patient/family that Dr. Shirlee LatchMcLean stated her taking new Rx Preservision Areds 2 "should be ok". Patient will start it this morning with breakfast. Family expressed appreciation for prompt answer to question.

## 2015-04-13 ENCOUNTER — Ambulatory Visit (INDEPENDENT_AMBULATORY_CARE_PROVIDER_SITE_OTHER): Payer: Medicare Other | Admitting: Cardiology

## 2015-04-13 ENCOUNTER — Encounter: Payer: Self-pay | Admitting: Cardiology

## 2015-04-13 VITALS — BP 110/60 | HR 86 | Ht 60.0 in | Wt 117.4 lb

## 2015-04-13 DIAGNOSIS — I5032 Chronic diastolic (congestive) heart failure: Secondary | ICD-10-CM

## 2015-04-13 DIAGNOSIS — I4891 Unspecified atrial fibrillation: Secondary | ICD-10-CM

## 2015-04-13 DIAGNOSIS — I509 Heart failure, unspecified: Secondary | ICD-10-CM | POA: Diagnosis not present

## 2015-04-13 LAB — CBC WITH DIFFERENTIAL/PLATELET
BASOS PCT: 1 % (ref 0–1)
Basophils Absolute: 0.1 10*3/uL (ref 0.0–0.1)
EOS PCT: 2 % (ref 0–5)
Eosinophils Absolute: 0.1 10*3/uL (ref 0.0–0.7)
HEMATOCRIT: 40.8 % (ref 36.0–46.0)
Hemoglobin: 13.6 g/dL (ref 12.0–15.0)
Lymphocytes Relative: 27 % (ref 12–46)
Lymphs Abs: 1.6 10*3/uL (ref 0.7–4.0)
MCH: 29.7 pg (ref 26.0–34.0)
MCHC: 33.3 g/dL (ref 30.0–36.0)
MCV: 89.1 fL (ref 78.0–100.0)
MPV: 9.2 fL (ref 8.6–12.4)
Monocytes Absolute: 0.5 10*3/uL (ref 0.1–1.0)
Monocytes Relative: 9 % (ref 3–12)
Neutro Abs: 3.6 10*3/uL (ref 1.7–7.7)
Neutrophils Relative %: 61 % (ref 43–77)
PLATELETS: 210 10*3/uL (ref 150–400)
RBC: 4.58 MIL/uL (ref 3.87–5.11)
RDW: 13.9 % (ref 11.5–15.5)
WBC: 5.9 10*3/uL (ref 4.0–10.5)

## 2015-04-13 LAB — BASIC METABOLIC PANEL
BUN: 18 mg/dL (ref 7–25)
CALCIUM: 9.3 mg/dL (ref 8.6–10.4)
CO2: 25 mmol/L (ref 20–31)
Chloride: 102 mmol/L (ref 98–110)
Creat: 1.35 mg/dL — ABNORMAL HIGH (ref 0.60–0.88)
Glucose, Bld: 86 mg/dL (ref 65–99)
Potassium: 4.1 mmol/L (ref 3.5–5.3)
Sodium: 139 mmol/L (ref 135–146)

## 2015-04-13 LAB — BRAIN NATRIURETIC PEPTIDE: BRAIN NATRIURETIC PEPTIDE: 89.1 pg/mL (ref 0.0–100.0)

## 2015-04-13 NOTE — Patient Instructions (Signed)
Medication Instructions: - no changes  Labwork: - Your physician recommends that you have lab work today: bmp/cbc/bnp  Procedures/Testing: - none  Follow-Up: - Your physician wants you to follow-up in: 6 months (March 2017) with Dr. Shirlee Latch. You will receive a reminder letter in the mail two months in advance. If you don't receive a letter, please call our office to schedule the follow-up appointment.  Any Additional Special Instructions Will Be Listed Below (If Applicable).

## 2015-04-15 NOTE — Progress Notes (Signed)
Patient ID: Melanie Blake, female   DOB: Aug 15, 1923, 81 y.o.   MRN: [id number] 81 yo with minimal past history was admitted to [facility] in 4/14 with complaints of weakness and dyspnea.  She was found to have atrial fibrillation with RVR in the 140s.  She was noted to have acute on chronic diastolic CHF.  She was rate-controlled and TEE was attempted pre-cardioversion.  I was unable to pass the probe for TEE.  Therefore, I waited 4 weeks and cardioverted her to NSR in 6/14.  At followup appointment after this, she was back in atrial fibrillation but spontaneously converted to NSR.  She has remained in atrial fibrillation since that time.  She seems to be doing well overall.  No exertional dyspnea.  She has been doing housework.  She is able to walk to her niece's house.  She can carry her garbage to the dumpster without difficulty.  No falls or lightheadedness.  No chest pain. Weight stable.  She does not realize that she is in atrial fibrillation.  No BRBPR or melena.  Labs (4/14): K 4.4, creatinine 1.3 => 1.4, HCT 42.7 Labs (6/14): K 4, creatinine 1.35 Labs (10/14): K 3.9, creatinine 1.4, HCT 36.2 Labs (1/15): K 4.2, creatinine 1.6, HCT 36.5 Labs (4/15): HCT 36.7 Labs (7/15): K 3.6, creatinine 1.47 Labs (11/15): K 3.7, creatinine 1.6, HCT 37.2 Labs (2/16): K 4, creatinine 1.4, BNP 137  ECG: Atrial fibrillation, nonspecific T wave flattening  PMH: 1. Atrial fibrillation: Paroxysmal.  Diagnosed in 4/14.  DCCV to NSR in 6/14. Back in atrial fibrillation in 10/15.  2. Chronic diastolic CHF: Echo (4/14) with EF "grossly normal," mild AI, mild MR, PA systolic pressure 40 mm Hg, mildly decreased RV systolic function.  3. CKD  SH: Single, lives alone in a townhouse, niece lives nearby, nonsmoker.   FH: No CAD.    ROS: All systems reviewed and negative except as per HPI.  Current Outpatient Prescriptions  Medication Sig Dispense Refill  . diltiazem (CARDIZEM CD) 120 MG 24 hr capsule Take 1  capsule (120 mg total) by mouth daily. 30 capsule 11  . furosemide (LASIX) 40 MG tablet TAKE 1 TABLET BY MOUTH EVERY MORNING AND 1/2 TABLET EVERY AFTERNOON AS DIRECTED 45 tablet 11  . metoprolol succinate (TOPROL-XL) 50 MG 24 hr tablet Take 1 tablet (50 mg total) by mouth daily. Take with or immediately following a meal. 30 tablet 11  . potassium chloride (K-DUR) 10 MEQ tablet Take 1 tablet (10 mEq total) by mouth daily. 30 tablet 11  . Rivaroxaban (XARELTO) 15 MG TABS tablet Take 1 tablet (15 mg total) by mouth daily. 30 tablet 11   No current facility-administered medications for this visit.    BP 110/60 mmHg  Pulse 86  Ht 5' (1.524 m)  Wt 117 lb 6.4 oz (53.252 kg)  BMI 22.93 kg/m2  SpO2 98% General: NAD Neck: JVP 7 cm, no thyromegaly or thyroid nodule.  Lungs: Slight bibasilar crackles. CV: Nondisplaced PMI.  Heart irregular S1/S2, no S3/S4, 1/6 SEM RUSB.  1+ bilateral ankle edema.  No carotid bruit.  Normal pedal pulses.  Abdomen: Soft, nontender, no hepatosplenomegaly, no distention.  Skin: Venous stasis dermatitis lower legs.   Neurologic: Alert and oriented x 3.  Psych: Normal affect. Extremities: No clubbing or cyanosis.   Assessment/Plan: 1. Atrial fibrillation:  Persistent.  However, she does not seem to be particularly symptomatic.  Given the lack of prominent symptoms, we have decided on rate control and  anticoagulation.   - Continue Xarelto 15 mg daily, check CBC. - Continue diltiazem CD and Toprol XL.  Rate control is reasonable.    2. Chronic diastolic CHF: NYHA class II symptoms.  Volume status looks good on current Lasix dose. Check BMET/BNP today. 3. CKD: Repeat BMET today.   Followup 6 months.   Marca Ancona 04/15/2015

## 2015-06-02 DIAGNOSIS — Z23 Encounter for immunization: Secondary | ICD-10-CM | POA: Diagnosis not present

## 2015-08-22 ENCOUNTER — Ambulatory Visit (INDEPENDENT_AMBULATORY_CARE_PROVIDER_SITE_OTHER): Payer: Medicare Other | Admitting: Ophthalmology

## 2015-08-22 DIAGNOSIS — H353134 Nonexudative age-related macular degeneration, bilateral, advanced atrophic with subfoveal involvement: Secondary | ICD-10-CM

## 2015-08-22 DIAGNOSIS — I10 Essential (primary) hypertension: Secondary | ICD-10-CM

## 2015-08-22 DIAGNOSIS — H35033 Hypertensive retinopathy, bilateral: Secondary | ICD-10-CM

## 2015-08-22 DIAGNOSIS — H43813 Vitreous degeneration, bilateral: Secondary | ICD-10-CM | POA: Diagnosis not present

## 2015-09-17 ENCOUNTER — Other Ambulatory Visit: Payer: Self-pay | Admitting: Cardiology

## 2015-09-22 ENCOUNTER — Other Ambulatory Visit: Payer: Self-pay | Admitting: Cardiology

## 2015-09-25 NOTE — Progress Notes (Signed)
Cardiology Office Note:    Date:  09/26/2015   ID:  Melanie PedroBettie M Tindall, DOB 02/17/1924, MRN [id number]  PCP:  No PCP Per Patient  Cardiologist:  Dr. Marca Anconaalton McLean   Electrophysiologist:  n/a  Chief Complaint  Patient presents with  . Atrial Fibrillation    Follow up    History of Present Illness:     Melanie Blake is a 82 y.o. female with a hx of chronic AFib, diastolic CHF. She was [facility] to [facility] in 4/14 with complaints of weakness and dyspnea. She was found to have atrial fibrillation with RVR in the 140s. She was noted to have acute on chronic diastolic CHF. She was rate-controlled and TEE was attempted pre-cardioversion. Dr. Shirlee LatchMcLean was unable to pass the probe for TEE. Therefore, after 4 weeks she was cardioverted to NSR in 6/14. At followup appointment after this, she was back in atrial fibrillation but spontaneously converted to NSR. She has remained in atrial fibrillation since that time.  Last seen by Dr. Marca Anconaalton McLean in 9/16.  Returns for FU.  She is doing well. Denies chest pain or significant dyspnea.  No orthopnea, PND. LE edema is much better. She has extensive bruising. Denies any melena, hematochezia, hematuria. Denies syncope.      Past Medical History  Diagnosis Date  . Cataract   . Atrial fibrillation (HCC)     dx 10/2012; s/p DCCV => NSR 12/23/2012  . Chronic diastolic heart failure (HCC)     a. Echo 4/14: EF "grossly normal", mild AI, mild MR, PASP 40, mild reduced RVSF  1. Atrial fibrillation: Paroxysmal. Diagnosed in 4/14. DCCV to NSR in 6/14. Back in atrial fibrillation in 10/15.  2. Chronic diastolic CHF: Echo (4/14) with EF "grossly normal," mild AI, mild MR, PA systolic pressure 40 mm Hg, mildly decreased RV systolic function.  3. CKD  Past Surgical History  Procedure Laterality Date  . Cardioversion N/A 12/23/2012    Procedure: CARDIOVERSION;  Surgeon: Laurey Moralealton S McLean, MD;  [facility];  Service: Cardiovascular;  Laterality:  N/A;    Current Medications: Outpatient Prescriptions Prior to Visit  Medication Sig Dispense Refill  . diltiazem (CARDIZEM CD) 120 MG 24 hr capsule Take 1 capsule (120 mg total) by mouth daily. 30 capsule 11  . furosemide (LASIX) 40 MG tablet TAKE 1 TABLET BY MOUTH EVERY MORNING AND 1/2 TABLET EVERY AFTERNOON AS DIRECTED (WILL PAY 3/1) 45 tablet 5  . metoprolol succinate (TOPROL-XL) 50 MG 24 hr tablet TAKE 1 TABLET (50 MG TOTAL) BY MOUTH DAILY. TAKE WITH OR IMMEDIATELY FOLLOWING A MEAL. 30 tablet 5  . XARELTO 15 MG TABS tablet TAKE 1 TABLET (15 MG TOTAL) BY MOUTH DAILY. 30 tablet 6  . potassium chloride (K-DUR) 10 MEQ tablet Take 1 tablet (10 mEq total) by mouth daily. 30 tablet 11   No facility-administered medications prior to visit.     Allergies:   Penicillins and Sulfa antibiotics   Social History   Social History  . Marital Status: Widowed    Spouse Name: N/A  . Number of Children: N/A  . Years of Education: N/A   Social History Main Topics  . Smoking status: Never Smoker   . Smokeless tobacco: None  . Alcohol Use: No  . Drug Use: No  . Sexual Activity: No   Other Topics Concern  . None   Social History Narrative     Family History:  The patient's family history is negative for Heart attack.  ROS:   Please see the history of present illness.    ROS All other systems reviewed and are negative.   Physical Exam:    VS:  BP 130/74 mmHg  Pulse 76  Ht 5' (1.524 m)  Wt 113 lb (51.256 kg)  BMI 22.07 kg/m2   GEN: Well nourished, well developed, in no acute distress HEENT: normal Neck: no JVD, no masses Cardiac: Normal S1/S2, irreg irreg; no murmurs, trace bilat LE edema;  no carotid bruits,   Respiratory:  clear to auscultation bilaterally; no wheezing, rhonchi or rales GI: soft, nontender MS: no deformity or atrophy Skin: warm and dry  Neuro:   no focal deficits  Psych: Alert and oriented x 3, normal affect  Wt Readings from Last 3 Encounters:    09/26/15 113 lb (51.256 kg)  04/13/15 117 lb 6.4 oz (53.252 kg)  09/14/14 117 lb (53.071 kg)      Studies/Labs Reviewed:     EKG:  EKG is  ordered today.  The ekg ordered today demonstrates AFib, HR 88, normal axis, no change from prior tracing  Recent Labs: 04/13/2015: BUN 18; Creat 1.35*; Hemoglobin 13.6; Platelets 210; Potassium 4.1; Sodium 139   Recent Lipid Panel No results found for: CHOL, TRIG, HDL, CHOLHDL, VLDL, LDLCALC, LDLDIRECT  Additional studies/ records that were reviewed today include:   Echo 4/14 Grossly normal LVEF, basal inf HK, mild AI, mild MR, PASP 40 mmHg, mild reduced RVSF   ASSESSMENT:     1. Chronic atrial fibrillation (HCC)   2. Chronic diastolic CHF (congestive heart failure) (HCC)   3. CKD (chronic kidney disease), stage 3 (moderate)     PLAN:     In order of problems listed above:  1. Atrial fibrillation: Chronic. She does not seem to be particularly symptomatic. Given the lack of prominent symptoms, we have decided on rate control and anticoagulation. Rate is controlled.   -  Continue Xarelto 15 mg daily, diltiazem CD and Toprol XL.   -  BMET, CBC today.  2. Chronic diastolic CHF: NYHA class II symptoms. Volume status looks good on current Lasix dose. Check BMET today.  3. CKD: Repeat BMET today.    Medication Adjustments/Labs and Tests Ordered: Current medicines are reviewed at length with the patient today.  Concerns regarding medicines are outlined above.  Medication changes, Labs and Tests ordered today are outlined in the Patient Instructions noted below. Patient Instructions  Medication Instructions:  Your physician recommends that you continue on your current medications as directed. Please refer to the Current Medication list given to you today.  Labwork: 1. TODAY BMET, CBC W/DIFF  Testing/Procedures: NONE  Follow-Up: Your physician wants you to follow-up in: 6 MONTHS WITH DR. Shirlee Latch You will receive a reminder letter  in the mail two months in advance. If you don't receive a letter, please call our office to schedule the follow-up appointment.  Any Other Special Instructions Will Be Listed Below (If Applicable).  If you need a refill on your cardiac medications before your next appointment, please call your pharmacy.   Signed, Melanie Newcomer, PA-C  09/26/2015 4:41 PM    [facility] [address] [place], [place], [place]  [address] Phone: [phone]; Fax: [phone]

## 2015-09-26 ENCOUNTER — Encounter: Payer: Self-pay | Admitting: Physician Assistant

## 2015-09-26 ENCOUNTER — Ambulatory Visit (INDEPENDENT_AMBULATORY_CARE_PROVIDER_SITE_OTHER): Payer: Medicare Other | Admitting: Physician Assistant

## 2015-09-26 VITALS — BP 130/74 | HR 76 | Ht 60.0 in | Wt 113.0 lb

## 2015-09-26 DIAGNOSIS — N183 Chronic kidney disease, stage 3 (moderate): Secondary | ICD-10-CM

## 2015-09-26 DIAGNOSIS — I482 Chronic atrial fibrillation, unspecified: Secondary | ICD-10-CM

## 2015-09-26 DIAGNOSIS — I5032 Chronic diastolic (congestive) heart failure: Secondary | ICD-10-CM

## 2015-09-26 LAB — CBC WITH DIFFERENTIAL/PLATELET
Basophils Absolute: 0.1 10*3/uL (ref 0.0–0.1)
Basophils Relative: 1 % (ref 0–1)
EOS ABS: 0.1 10*3/uL (ref 0.0–0.7)
EOS PCT: 1 % (ref 0–5)
HEMATOCRIT: 40.8 % (ref 36.0–46.0)
Hemoglobin: 13.4 g/dL (ref 12.0–15.0)
LYMPHS PCT: 26 % (ref 12–46)
Lymphs Abs: 1.6 10*3/uL (ref 0.7–4.0)
MCH: 29.3 pg (ref 26.0–34.0)
MCHC: 32.8 g/dL (ref 30.0–36.0)
MCV: 89.1 fL (ref 78.0–100.0)
MONO ABS: 0.5 10*3/uL (ref 0.1–1.0)
MPV: 9 fL (ref 8.6–12.4)
Monocytes Relative: 9 % (ref 3–12)
Neutro Abs: 3.8 10*3/uL (ref 1.7–7.7)
Neutrophils Relative %: 63 % (ref 43–77)
Platelets: 256 10*3/uL (ref 150–400)
RBC: 4.58 MIL/uL (ref 3.87–5.11)
RDW: 13.6 % (ref 11.5–15.5)
WBC: 6 10*3/uL (ref 4.0–10.5)

## 2015-09-26 LAB — BASIC METABOLIC PANEL
BUN: 27 mg/dL — AB (ref 7–25)
CALCIUM: 9.7 mg/dL (ref 8.6–10.4)
CO2: 27 mmol/L (ref 20–31)
CREATININE: 1.61 mg/dL — AB (ref 0.60–0.88)
Chloride: 99 mmol/L (ref 98–110)
GLUCOSE: 92 mg/dL (ref 65–99)
POTASSIUM: 4.4 mmol/L (ref 3.5–5.3)
Sodium: 137 mmol/L (ref 135–146)

## 2015-09-26 NOTE — Patient Instructions (Addendum)
Medication Instructions:  Your physician recommends that you continue on your current medications as directed. Please refer to the Current Medication list given to you today.  Labwork: 1. TODAY BMET, CBC W/DIFF  Testing/Procedures: NONE  Follow-Up: Your physician wants you to follow-up in: 6 MONTHS WITH DR. Shirlee LatchMCLEAN You will receive a reminder letter in the mail two months in advance. If you don't receive a letter, please call our office to schedule the follow-up appointment.  Any Other Special Instructions Will Be Listed Below (If Applicable).  If you need a refill on your cardiac medications before your next appointment, please call your pharmacy.

## 2015-09-28 ENCOUNTER — Other Ambulatory Visit: Payer: Self-pay | Admitting: Cardiology

## 2016-03-11 ENCOUNTER — Other Ambulatory Visit: Payer: Self-pay | Admitting: Cardiology

## 2016-03-27 ENCOUNTER — Other Ambulatory Visit: Payer: Self-pay | Admitting: Cardiology

## 2016-04-11 ENCOUNTER — Other Ambulatory Visit: Payer: Self-pay | Admitting: Cardiology

## 2016-05-02 ENCOUNTER — Encounter: Payer: Self-pay | Admitting: Cardiology

## 2016-05-02 ENCOUNTER — Ambulatory Visit (INDEPENDENT_AMBULATORY_CARE_PROVIDER_SITE_OTHER): Payer: Medicare Other | Admitting: Cardiology

## 2016-05-02 VITALS — BP 140/68 | HR 84 | Ht 60.0 in | Wt 108.0 lb

## 2016-05-02 DIAGNOSIS — I5032 Chronic diastolic (congestive) heart failure: Secondary | ICD-10-CM | POA: Diagnosis not present

## 2016-05-02 DIAGNOSIS — I480 Paroxysmal atrial fibrillation: Secondary | ICD-10-CM | POA: Diagnosis not present

## 2016-05-02 DIAGNOSIS — Z79899 Other long term (current) drug therapy: Secondary | ICD-10-CM | POA: Diagnosis not present

## 2016-05-02 LAB — CBC
HEMATOCRIT: 41.2 % (ref 35.0–45.0)
HEMOGLOBIN: 13.5 g/dL (ref 11.7–15.5)
MCH: 29.5 pg (ref 27.0–33.0)
MCHC: 32.8 g/dL (ref 32.0–36.0)
MCV: 90.2 fL (ref 80.0–100.0)
MPV: 8.5 fL (ref 7.5–12.5)
Platelets: 242 10*3/uL (ref 140–400)
RBC: 4.57 MIL/uL (ref 3.80–5.10)
RDW: 13.9 % (ref 11.0–15.0)
WBC: 6.1 10*3/uL (ref 3.8–10.8)

## 2016-05-02 LAB — BASIC METABOLIC PANEL
BUN: 20 mg/dL (ref 7–25)
CALCIUM: 9.7 mg/dL (ref 8.6–10.4)
CO2: 25 mmol/L (ref 20–31)
CREATININE: 1.84 mg/dL — AB (ref 0.60–0.88)
Chloride: 99 mmol/L (ref 98–110)
GLUCOSE: 98 mg/dL (ref 65–99)
Potassium: 3.7 mmol/L (ref 3.5–5.3)
SODIUM: 140 mmol/L (ref 135–146)

## 2016-05-02 MED ORDER — POTASSIUM CHLORIDE CRYS ER 10 MEQ PO TBCR: 10.0000 meq | EXTENDED_RELEASE_TABLET | Freq: Every day | ORAL | 3 refills | Status: DC

## 2016-05-02 MED ORDER — DILTIAZEM HCL ER COATED BEADS 120 MG PO CP24: 120.0000 mg | ORAL_CAPSULE | Freq: Every day | ORAL | 3 refills | Status: DC

## 2016-05-02 MED ORDER — METOPROLOL SUCCINATE ER 50 MG PO TB24: ORAL_TABLET | ORAL | 3 refills | Status: DC

## 2016-05-02 MED ORDER — FUROSEMIDE 40 MG PO TABS: ORAL_TABLET | ORAL | 3 refills | Status: DC

## 2016-05-02 MED ORDER — RIVAROXABAN 15 MG PO TABS: ORAL_TABLET | ORAL | 3 refills | Status: DC

## 2016-05-02 NOTE — Patient Instructions (Signed)
Medication Instructions:  The current medical regimen is effective;  continue present plan and medications.  Labwork: Please have blood work today [facility](BMP,CBC)  Follow-Up: Follow up in 6 months with Dr. Cristal Deerhristopher End.  You will receive a letter in the mail 2 months before you are due.  Please call us when you receive this letter to schedule your follow up appointment.  If you need a refill on your cardiac medications before your next appointment, please call your pharmacy.  Thank you for choosing [facility]!!

## 2016-05-04 NOTE — Progress Notes (Signed)
Patient ID: Melanie Blake, female   DOB: 12/04/1923, 82 y.o.   MRN: [id number]  82 yo with minimal past history was admitted to [facility] in 4/14 with complaints of weakness and dyspnea.  She was found to have atrial fibrillation with RVR in the 140s.  She was noted to have acute on chronic diastolic CHF.  She was rate-controlled and TEE was attempted pre-cardioversion.  I was unable to pass the probe for TEE.  Therefore, I waited 4 weeks and cardioverted her to NSR in 6/14.  At followup appointment after this, she was back in atrial fibrillation but spontaneously converted to NSR.  She has remained in atrial fibrillation since that time.  She has had a hard 2-3 days.  She has developed nausea and diarrhea, no emesis. No abdominal pain.  Poor appetite. No exertional dyspnea.  No falls or lightheadedness.  No chest pain. Weight stable.  She does not realize that she is in atrial fibrillation.  No BRBPR or melena.  Labs (4/14): K 4.4, creatinine 1.3 => 1.4, HCT 42.7 Labs (6/14): K 4, creatinine 1.35 Labs (10/14): K 3.9, creatinine 1.4, HCT 36.2 Labs (1/15): K 4.2, creatinine 1.6, HCT 36.5 Labs (4/15): HCT 36.7 Labs (7/15): K 3.6, creatinine 1.47 Labs (11/15): K 3.7, creatinine 1.6, HCT 37.2 Labs (2/16): K 4, creatinine 1.4, BNP 137 Labs (3/17): K 4.4, creatinine 1.61, hgb 13.4  ECG: Atrial fibrillation, nonspecific T wave flattening  PMH: 1. Atrial fibrillation: Chronic.  Diagnosed in 4/14.  DCCV to NSR in 6/14. Back in atrial fibrillation in 10/15.  2. Chronic diastolic CHF: Echo (4/14) with EF "grossly normal," mild AI, mild MR, PA systolic pressure 40 mm Hg, mildly decreased RV systolic function.  3. CKD  SH: Single, lives alone in a townhouse, niece lives nearby, nonsmoker.   FH: No CAD.    ROS: All systems reviewed and negative except as per HPI.  Current Outpatient Prescriptions  Medication Sig Dispense Refill  . diltiazem (CARDIZEM CD) 120 MG 24 hr capsule Take 1 capsule (120 mg  total) by mouth daily. 90 capsule 3  . furosemide (LASIX) 40 MG tablet TAKE 1 TABLET BY MOUTH EVERY MORNING AND 1/2 TABLET EVERY AFTERNOON AS DIRECTED (WILL PAY 3/1) 135 tablet 3  . metoprolol succinate (TOPROL-XL) 50 MG 24 hr tablet TAKE 1 TABLET (50 MG TOTAL) BY MOUTH DAILY. TAKE WITH OR IMMEDIATELY FOLLOWING A MEAL. 90 tablet 3  . potassium chloride (KLOR-CON M10) 10 MEQ tablet Take 1 tablet (10 mEq total) by mouth daily. 90 tablet 3  . Rivaroxaban (XARELTO) 15 MG TABS tablet TAKE 1 TABLET (15 MG TOTAL) BY MOUTH DAILY. 90 tablet 3   No current facility-administered medications for this visit.     BP 140/68   Pulse 84   Ht 5' (1.524 m)   Wt 108 lb (49 kg)   BMI 21.09 kg/m  General: NAD Neck: JVP 7 cm, no thyromegaly or thyroid nodule.  Lungs: Slight bibasilar crackles. CV: Nondisplaced PMI.  Heart irregular S1/S2, no S3/S4, 1/6 SEM RUSB.  Trace ankle edema.  No carotid bruit.  Normal pedal pulses.  Abdomen: Soft, nontender, no hepatosplenomegaly, no distention.  Skin: Venous stasis dermatitis lower legs.   Neurologic: Alert and oriented x 3.  Psych: Normal affect. Extremities: No clubbing or cyanosis.   Assessment/Plan: 1. Atrial fibrillation:  Chronic.  However, she does not seem to be particularly symptomatic.  Given the lack of prominent symptoms, we have decided on rate control and anticoagulation.   -  Continue Xarelto 15 mg daily, check CBC. - Continue diltiazem CD and Toprol XL.  Rate control is reasonable.    2. Chronic diastolic CHF: NYHA class II symptoms.  Volume status looks good on current Lasix dose. Check BMET today. 3. CKD: Repeat BMET today.  4. GI: Symptoms suggestive of acute gastroenteritis.  If no improvement, needs to followup with PCP. If she is not eating and having profuse diarrhea, she can hold her Lasix.   Followup 6 months.  She will see Dr End given my transition to CHF clinic.   Marca Ancona 05/04/2016

## 2016-05-05 ENCOUNTER — Telehealth: Payer: Self-pay | Admitting: Pediatrics

## 2016-05-05 DIAGNOSIS — R7989 Other specified abnormal findings of blood chemistry: Secondary | ICD-10-CM

## 2016-05-05 NOTE — Telephone Encounter (Signed)
-----   Message from Laurey Moralealton S McLean, MD sent at 05/04/2016 11:30 PM EDT ----- Creatinine higher than prior, decrease Lasix to 40 mg daily (stop pm Lasix).  BMET in 10 days.

## 2016-05-05 NOTE — Telephone Encounter (Signed)
I spoke with patient in regards to labs. I advised her of results, medication change, and lab appt. She voiced understanding and agreed with plan.  I entered order and scheduled appt w/ lab. She states if niece can not bring her 05/16/16 she will call back and reschedule.

## 2016-05-11 ENCOUNTER — Emergency Department (HOSPITAL_COMMUNITY): Payer: Medicare Other

## 2016-05-11 ENCOUNTER — Emergency Department (HOSPITAL_COMMUNITY)
Admission: EM | Admit: 2016-05-11 | Discharge: 2016-05-11 | Disposition: A | Discharge status: Home / Self Care | Payer: Medicare Other | Attending: Emergency Medicine | Admitting: Emergency Medicine

## 2016-05-11 ENCOUNTER — Encounter (HOSPITAL_COMMUNITY): Payer: Self-pay | Admitting: *Deleted

## 2016-05-11 DIAGNOSIS — I5032 Chronic diastolic (congestive) heart failure: Secondary | ICD-10-CM | POA: Insufficient documentation

## 2016-05-11 DIAGNOSIS — M545 Low back pain, unspecified: Secondary | ICD-10-CM

## 2016-05-11 DIAGNOSIS — N189 Chronic kidney disease, unspecified: Secondary | ICD-10-CM | POA: Insufficient documentation

## 2016-05-11 DIAGNOSIS — R109 Unspecified abdominal pain: Secondary | ICD-10-CM | POA: Insufficient documentation

## 2016-05-11 DIAGNOSIS — Z7901 Long term (current) use of anticoagulants: Secondary | ICD-10-CM | POA: Insufficient documentation

## 2016-05-11 LAB — CBC WITH DIFFERENTIAL/PLATELET
BASOS ABS: 0 10*3/uL (ref 0.0–0.1)
Basophils Relative: 0 %
Eosinophils Absolute: 0.1 10*3/uL (ref 0.0–0.7)
Eosinophils Relative: 1 %
HEMATOCRIT: 38.9 % (ref 36.0–46.0)
Hemoglobin: 12.7 g/dL (ref 12.0–15.0)
LYMPHS ABS: 1.1 10*3/uL (ref 0.7–4.0)
LYMPHS PCT: 13 %
MCH: 30 pg (ref 26.0–34.0)
MCHC: 32.6 g/dL (ref 30.0–36.0)
MCV: 92 fL (ref 78.0–100.0)
MONO ABS: 0.7 10*3/uL (ref 0.1–1.0)
Monocytes Relative: 9 %
NEUTROS ABS: 6.1 10*3/uL (ref 1.7–7.7)
Neutrophils Relative %: 77 %
Platelets: 221 10*3/uL (ref 150–400)
RBC: 4.23 MIL/uL (ref 3.87–5.11)
RDW: 13.8 % (ref 11.5–15.5)
WBC: 8 10*3/uL (ref 4.0–10.5)

## 2016-05-11 LAB — URINALYSIS, ROUTINE W REFLEX MICROSCOPIC
Bilirubin Urine: NEGATIVE
GLUCOSE, UA: NEGATIVE mg/dL
Hgb urine dipstick: NEGATIVE
Ketones, ur: NEGATIVE mg/dL
LEUKOCYTES UA: NEGATIVE
Nitrite: NEGATIVE
PH: 5.5 (ref 5.0–8.0)
Protein, ur: NEGATIVE mg/dL
Specific Gravity, Urine: 1.016 (ref 1.005–1.030)

## 2016-05-11 LAB — COMPREHENSIVE METABOLIC PANEL
ALBUMIN: 3.7 g/dL (ref 3.5–5.0)
ALT: 13 U/L — ABNORMAL LOW (ref 14–54)
ANION GAP: 11 (ref 5–15)
AST: 21 U/L (ref 15–41)
Alkaline Phosphatase: 54 U/L (ref 38–126)
BILIRUBIN TOTAL: 0.7 mg/dL (ref 0.3–1.2)
BUN: 24 mg/dL — ABNORMAL HIGH (ref 6–20)
CHLORIDE: 104 mmol/L (ref 101–111)
CO2: 23 mmol/L (ref 22–32)
Calcium: 9.4 mg/dL (ref 8.9–10.3)
Creatinine, Ser: 1.4 mg/dL — ABNORMAL HIGH (ref 0.44–1.00)
GFR calc Af Amer: 37 mL/min — ABNORMAL LOW (ref >60)
GFR calc non Af Amer: 32 mL/min — ABNORMAL LOW (ref >60)
GLUCOSE: 91 mg/dL (ref 65–99)
POTASSIUM: 4.5 mmol/L (ref 3.5–5.1)
SODIUM: 138 mmol/L (ref 135–145)
TOTAL PROTEIN: 6.4 g/dL — AB (ref 6.5–8.1)

## 2016-05-11 MED ORDER — ACETAMINOPHEN 500 MG PO TABS
500.0000 mg | ORAL_TABLET | Freq: Once | ORAL | Status: AC
  Administered 2016-05-11: 500 mg via ORAL
  Filled 2016-05-11: qty 1

## 2016-05-11 NOTE — ED Triage Notes (Addendum)
Pt reports intermittent lower back pain radiating down legs for about a week. Pt dysuria, states lasix has been cut in half.

## 2016-05-11 NOTE — ED Provider Notes (Signed)
[facility]-EMERGENCY DEPT Provider Note   CSN: [id number] Arrival date & time: 05/11/16  1442     History   Chief Complaint Chief Complaint  Patient presents with  . Back Pain    HPI Melanie Blake is a 82 y.o. female.  Patient presents with intermittent back pain for the last week that worsens when she bends over or lays flat. She reports decreased urination but no dysuria, and did state she recently stopped her Lasix per her PCP. Has not tried anything for pain. Denies neurological symptoms. Denies fever or incontinence.   The history is provided by the patient and a relative. No language interpreter was used.  Back Pain   This is a new problem. The current episode started more than 1 week ago. The problem occurs daily. The problem has not changed since onset.The pain is associated with no known injury. The pain is present in the lumbar spine. The quality of the pain is described as aching. The pain does not radiate. The pain is at a severity of 5/10. The pain is moderate. The symptoms are aggravated by bending and certain positions. The pain is the same all the time. Pertinent negatives include no chest pain, no fever, no numbness, no abdominal pain, no bowel incontinence, no perianal numbness, no bladder incontinence, no dysuria, no pelvic pain, no leg pain, no paresthesias, no paresis, no tingling and no weakness. She has tried nothing for the symptoms. Risk factors include poor posture and menopause.    Past Medical History:  Diagnosis Date  . Atrial fibrillation (HCC)    dx 10/2012; s/p DCCV => NSR 12/23/2012  . Cataract   . Chronic diastolic heart failure (HCC)    a. Echo 4/14: EF "grossly normal", mild AI, mild MR, PASP 40, mild reduced RVSF    Patient Active Problem List   Diagnosis Date Noted  . CKD (chronic kidney disease) 11/13/2013  . Atrial fibrillation (HCC) 11/11/2012  . Chronic diastolic CHF (congestive heart failure) (HCC) 11/11/2012  . Atrial fibrillation with  RVR (HCC) 10/28/2012  . Acute diastolic CHF (congestive heart failure) (HCC) 10/28/2012  . Elevated TSH 10/28/2012    Past Surgical History:  Procedure Laterality Date  . CARDIOVERSION N/A 12/23/2012   Procedure: CARDIOVERSION;  Surgeon: Laurey Morale, MD;  Location: [facility] ENDOSCOPY;  Service: Cardiovascular;  Laterality: N/A;    OB History    No data available       Home Medications    Prior to Admission medications   Medication Sig Start Date End Date Taking? Authorizing Provider  diltiazem (CARDIZEM CD) 120 MG 24 hr capsule Take 1 capsule (120 mg total) by mouth daily. 05/02/16   Laurey Morale, MD  furosemide (LASIX) 40 MG tablet TAKE 1 TABLET BY MOUTH EVERY MORNING AND 1/2 TABLET EVERY AFTERNOON AS DIRECTED (WILL PAY 3/1) 05/02/16   Laurey Morale, MD  metoprolol succinate (TOPROL-XL) 50 MG 24 hr tablet TAKE 1 TABLET (50 MG TOTAL) BY MOUTH DAILY. TAKE WITH OR IMMEDIATELY FOLLOWING A MEAL. 05/02/16   Laurey Morale, MD  potassium chloride (KLOR-CON M10) 10 MEQ tablet Take 1 tablet (10 mEq total) by mouth daily. 05/02/16   Laurey Morale, MD  Rivaroxaban (XARELTO) 15 MG TABS tablet TAKE 1 TABLET (15 MG TOTAL) BY MOUTH DAILY. 05/02/16   Laurey Morale, MD    Family History Family History  Problem Relation Age of Onset  . Heart attack Neg Hx     Social History Social History  Substance Use Topics  . Smoking status: Never Smoker  . Smokeless tobacco: Never Used  . Alcohol use No     Allergies   Penicillins and Sulfa antibiotics   Review of Systems Review of Systems  Constitutional: Negative for fever.  HENT: Negative.   Respiratory: Negative for shortness of breath.   Cardiovascular: Negative for chest pain.  Gastrointestinal: Negative for abdominal pain and bowel incontinence.  Genitourinary: Negative for bladder incontinence, dysuria and pelvic pain.  Musculoskeletal: Positive for back pain.  Skin: Negative.   Allergic/Immunologic: Negative for  immunocompromised state.  Neurological: Negative for tingling, weakness, numbness and paresthesias.  Hematological: Does not bruise/bleed easily.  Psychiatric/Behavioral: Negative.      Physical Exam Updated Vital Signs BP 110/69   Pulse (!) 59   Temp 98.1 F (36.7 C) (Oral)   Resp 20   Ht 4\' 10"  (1.473 m)   Wt 49 kg   SpO2 99%   BMI 22.57 kg/m   Physical Exam  Constitutional: She is oriented to person, place, and time. She appears well-developed and well-nourished. No distress.  HENT:  Head: Normocephalic and atraumatic.  Eyes: Conjunctivae and EOM are normal.  Neck: Normal range of motion. Neck supple.  Cardiovascular: Normal rate, regular rhythm, normal heart sounds and intact distal pulses.   Pulmonary/Chest: Effort normal. No respiratory distress.  Abdominal: Soft. She exhibits no distension and no mass. There is no tenderness. There is no guarding.  Musculoskeletal: Normal range of motion. She exhibits no edema.  No tenderness to cervical, lumbar, or thoracic spine. No stepoff deformity. Severe kyphosis.  Neurological: She is alert and oriented to person, place, and time.  5/5 strength of hip flexion/extension, dorsiflexion/platnarflexion. Sensation intact distally in all lower extremity distributions. Downgoing babinski's bilaterally, no ankle clonus bilaterally, 1+ symmetric patellar reflexes bilaterally.  Skin: Skin is warm and dry. Capillary refill takes less than 2 seconds. No rash noted. She is not diaphoretic. No pallor.  Psychiatric: She has a normal mood and affect. Her behavior is normal. Judgment and thought content normal.     ED Treatments / Results  Labs (all labs ordered are listed, but only abnormal results are displayed) Labs Reviewed  COMPREHENSIVE METABOLIC PANEL - Abnormal; Notable for the following:       Result Value   BUN 24 (*)    Creatinine, Ser 1.40 (*)    Total Protein 6.4 (*)    ALT 13 (*)    GFR calc non Af Amer 32 (*)    GFR calc Af  Amer 37 (*)    All other components within normal limits  URINALYSIS, ROUTINE W REFLEX MICROSCOPIC (NOT AT [facility]) - Abnormal; Notable for the following:    Color, Urine AMBER (*)    All other components within normal limits  CBC WITH DIFFERENTIAL/PLATELET    EKG  EKG Interpretation None       Radiology Ct Renal Stone Study  Result Date: 05/11/2016 CLINICAL DATA:  Left flank and severe low back pain for 2 weeks. History of kidney stones. EXAM: CT ABDOMEN AND PELVIS WITHOUT CONTRAST TECHNIQUE: Multidetector CT imaging of the abdomen and pelvis was performed following the standard protocol without IV contrast. COMPARISON:  Lumbar spine radiographs 08/09/2010. Report from CT 03/18/2001. FINDINGS: Lower chest: Moderate to large hiatal hernia is incompletely visualized. There is linear atelectasis or scarring at both lung bases. No significant pleural or pericardial effusion. There is atherosclerosis of the thoracic aorta. Hepatobiliary: There is a 16 mm low-density cyst in  the dome of the right hepatic lobe on image 7. There is additional low density medially in the left lobe on image 16. No suspicious hepatic findings on noncontrast imaging. The gallbladder is incompletely distended. No evidence of calcified gallstones, surrounding inflammatory change or biliary dilatation. Pancreas: Unremarkable. No pancreatic ductal dilatation or surrounding inflammatory changes. Spleen: Normal in size without focal abnormality. Adrenals/Urinary Tract: Mild low-density prominence of both adrenal glands, likely due to hyperplasia. No suspicious adrenal findings. Both kidneys demonstrate mild cortical thinning. There is a 5 mm calcification in the upper pole the right kidney which may reflect a nonobstructing calculus. No other urinary tract calculi are seen. There is no evidence of hydronephrosis or perinephric soft tissue stranding. The bladder appears unremarkable. Stomach/Bowel: No evidence of bowel wall  thickening, distention or surrounding inflammatory change. The cecum appears mobile. The appendix appears normal. Mildly prominent stool throughout the colon. There are mild diverticular changes of the sigmoid colon. Vascular/Lymphatic: There are no enlarged abdominal or pelvic lymph nodes. Diffuse aortic and branch vessel atherosclerosis. Reproductive: The uterus and adnexa appear unremarkable for age. No evidence of adnexal mass or pelvic inflammatory process. Other: No evidence of abdominal wall mass or hernia. No ascites. Musculoskeletal: No acute or significant osseous findings. There are degenerative changes throughout the lumbar spine associated with a convex left scoliosis. IMPRESSION: 1. No acute findings or explanation for the patient's symptoms. 2. No evidence of hydronephrosis or left-sided urinary tract calculus. Possible small nonobstructing calculus in the upper pole of the right kidney. 3. Moderate to large hiatal hernia. 4. Aortic and branch vessel atherosclerosis. Electronically Signed   By: Carey Bullocks M.D.   On: 05/11/2016 19:38    Procedures Procedures (including critical care time)  Medications Ordered in ED Medications  acetaminophen (TYLENOL) tablet 500 mg (500 mg Oral Given 05/11/16 2032)     Initial Impression / Assessment and Plan / ED Course  I have reviewed the triage vital signs and the nursing notes.  Pertinent labs & imaging results that were available during my care of the patient were reviewed by me and considered in my medical decision making (see chart for details).  Clinical Course    Patient presents with intermittent generalized back pain for the past week, worsened with certain positions but not associated with traumatic injury. She does report history of nephrolithiasis and states this felt similar. She denies any neurological symptoms and has no red flag symptoms for cauda equina, no saddle anesthesia or bowel/bladder incontinence. Has a normal  neurological examination with normal reflexes. Bedside screening ultrasound negative for aortic aneurysm. UA unremarkable without signs of UTI or pyelonephritis. CT stone study obtained to evaluate for nephrolithiasis and this did not reveal obstructing nephrolithiasis or other abnormality. Symptoms likely due to musculoskeletal pain. She was advised to start OTC analgesics and follow up with her PCP for continued management. She was given return precautions for worsening symptoms and expressed understanding. She is in good condition for discharge home.  Final Clinical Impressions(s) / ED Diagnoses   Final diagnoses:  Acute bilateral low back pain without sciatica    New Prescriptions Discharge Medication List as of 05/11/2016  8:01 PM       Preston Fleeting, MD 05/12/16 [id number]    Blane Ohara, MD 05/12/16 [phone]

## 2016-05-11 NOTE — ED Notes (Signed)
Scanned PT bladder, found 147 ml

## 2016-05-13 ENCOUNTER — Encounter (HOSPITAL_COMMUNITY): Payer: Self-pay | Admitting: Emergency Medicine

## 2016-05-13 ENCOUNTER — Emergency Department (HOSPITAL_COMMUNITY)
Admission: EM | Admit: 2016-05-13 | Discharge: 2016-05-13 | Disposition: A | Discharge status: Home / Self Care | Payer: Medicare Other | Attending: Emergency Medicine | Admitting: Emergency Medicine

## 2016-05-13 ENCOUNTER — Telehealth: Payer: Self-pay | Admitting: Cardiology

## 2016-05-13 DIAGNOSIS — I5032 Chronic diastolic (congestive) heart failure: Secondary | ICD-10-CM | POA: Insufficient documentation

## 2016-05-13 DIAGNOSIS — N189 Chronic kidney disease, unspecified: Secondary | ICD-10-CM | POA: Diagnosis not present

## 2016-05-13 DIAGNOSIS — M545 Low back pain: Secondary | ICD-10-CM | POA: Diagnosis present

## 2016-05-13 MED ORDER — HYDROMORPHONE HCL 1 MG/ML IJ SOLN
0.5000 mg | Freq: Once | INTRAMUSCULAR | Status: DC
  Filled 2016-05-13: qty 1

## 2016-05-13 MED ORDER — TRAMADOL HCL 50 MG PO TABS
50.0000 mg | ORAL_TABLET | Freq: Once | ORAL | Status: AC
  Administered 2016-05-13: 50 mg via ORAL
  Filled 2016-05-13: qty 1

## 2016-05-13 MED ORDER — DEXAMETHASONE SODIUM PHOSPHATE 10 MG/ML IJ SOLN
10.0000 mg | Freq: Once | INTRAMUSCULAR | Status: AC
  Administered 2016-05-13: 10 mg via INTRAMUSCULAR
  Filled 2016-05-13: qty 1

## 2016-05-13 MED ORDER — TRAMADOL HCL 50 MG PO TABS: 50.0000 mg | ORAL_TABLET | Freq: Two times a day (BID) | ORAL | 0 refills | Status: DC | PRN

## 2016-05-13 NOTE — Telephone Encounter (Signed)
New message   Mrs. Ezzard Standingewman verbalized that she is calling to speak to the rn about pt and hospital visit

## 2016-05-13 NOTE — ED Notes (Signed)
Bed: WA01 Expected date:  Expected time:  Means of arrival:  Comments: EMS-back pain-82 y/o

## 2016-05-13 NOTE — ED Triage Notes (Signed)
Per EMS, patient complaining of lower back pain, radiating down left leg. [facility] (in addition to difficulty urinating, that has since resolved) Patient alert and oriented x4. Patient is from home.

## 2016-05-13 NOTE — ED Notes (Signed)
Niece stated "she has not fallen, she does pretty much everything but drives.  She lives alone but is down the street maybe 15 seconds from me."

## 2016-05-13 NOTE — Telephone Encounter (Signed)
Spoke with patient's niece, (DPR on file). She was scheduled for this Friday to repeat BMET due to elevated creatinine (1.84) on 10/13. Seen in ER on 2 days ago for low back pain.  Creatinine at that time was 1.40.  Niece would like to know if she still needs to come for labs this Friday. I advised it looks like her creatinine has stabilized since changing her dose of furosemide as recommended by Dr. Shirlee LatchMcLean.  Advised we will cancel her lab appointment for 10/27, and if Dr. Shirlee LatchMcLean would like additional lab work we will call and let her know.  Pt has no PCP.  Niece is planning to help her establish this.

## 2016-05-13 NOTE — Telephone Encounter (Signed)
Ok, no need for further labs.

## 2016-05-13 NOTE — ED Provider Notes (Signed)
[facility]-EMERGENCY DEPT Provider Note   CSN: [id number] Arrival date & time: 05/13/16  1836 By signing my name below, I, Bridgette HabermannMaria Tan, attest that this documentation has been prepared under the direction and in the presence of Raeford RazorStephen Jayah Balthazar, MD. Electronically Signed: Bridgette HabermannMaria Tan, ED Scribe. 05/13/16. 7:03 PM.  History   Chief Complaint Chief Complaint  Patient presents with  . Back Pain   HPI Comments: Melanie Blake is a 82 y.o. female with h/o CHF, CKD, and A-fib who presents to the Emergency Department by EMS complaining of 10/10 lower back pain radiating down to her left leg onset just PTA. Pt reports pain is exacerbated with movement and improved when lying flat. She has taken Tylenol with no relief to her pain. [facility] 2 days ago. She had a renal CT done which was unremarkable. Pt currently lives by herself at home. She denies fever, saddle anesthesia, or any other associated symptoms at this time.   The history is provided by the patient. No language interpreter was used.    Past Medical History:  Diagnosis Date  . Atrial fibrillation (HCC)    dx 10/2012; s/p DCCV => NSR 12/23/2012  . Cataract   . Chronic diastolic heart failure (HCC)    a. Echo 4/14: EF "grossly normal", mild AI, mild MR, PASP 40, mild reduced RVSF    Patient Active Problem List   Diagnosis Date Noted  . CKD (chronic kidney disease) 11/13/2013  . Atrial fibrillation (HCC) 11/11/2012  . Chronic diastolic CHF (congestive heart failure) (HCC) 11/11/2012  . Atrial fibrillation with RVR (HCC) 10/28/2012  . Acute diastolic CHF (congestive heart failure) (HCC) 10/28/2012  . Elevated TSH 10/28/2012    Past Surgical History:  Procedure Laterality Date  . CARDIOVERSION N/A 12/23/2012   Procedure: CARDIOVERSION;  Surgeon: Laurey Moralealton S McLean, MD;  Location[facility] ENDOS[facility];  Service: Cardiovascular;  Laterality: N/A;    OB History    No data available       Home Medications    Prior  to Admission medications   Medication Sig Start Date End Date Taking? Authorizing Provider  diltiazem (CARDIZEM CD) 120 MG 24 hr capsule Take 1 capsule (120 mg total) by mouth daily. 05/02/16   Laurey Moralealton S McLean, MD  furosemide (LASIX) 40 MG tablet TAKE 1 TABLET BY MOUTH EVERY MORNING AND 1/2 TABLET EVERY AFTERNOON AS DIRECTED (WILL PAY 3/1) 05/02/16   Laurey Moralealton S McLean, MD  metoprolol succinate (TOPROL-XL) 50 MG 24 hr tablet TAKE 1 TABLET (50 MG TOTAL) BY MOUTH DAILY. TAKE WITH OR IMMEDIATELY FOLLOWING A MEAL. 05/02/16   Laurey Moralealton S McLean, MD  potassium chloride (KLOR-CON M10) 10 MEQ tablet Take 1 tablet (10 mEq total) by mouth daily. 05/02/16   Laurey Moralealton S McLean, MD  Rivaroxaban (XARELTO) 15 MG TABS tablet TAKE 1 TABLET (15 MG TOTAL) BY MOUTH DAILY. 05/02/16   Laurey Moralealton S McLean, MD    Family History Family History  Problem Relation Age of Onset  . Heart attack Neg Hx     Social History Social History  Substance Use Topics  . Smoking status: Never Smoker  . Smokeless tobacco: Never Used  . Alcohol use No     Allergies   Penicillins and Sulfa antibiotics   Review of Systems Review of Systems  Constitutional: Negative for fever.  Musculoskeletal: Positive for arthralgias, back pain and myalgias.  Neurological: Negative for numbness.  All other systems reviewed and are negative.    Physical Exam  Updated Vital Signs BP (!) 115/54 (BP Location: Right Arm)   Pulse 75   Temp 97.9 F (36.6 C) (Oral)   Resp 19   Ht 4\' 10"  (1.473 m)   Wt 108 lb (49 kg)   SpO2 94%   BMI 22.57 kg/m   Physical Exam  Constitutional: She is oriented to person, place, and time. She appears well-developed and well-nourished. No distress.  HENT:  Head: Normocephalic and atraumatic.  Nose: Nose normal.  Mouth/Throat: Oropharynx is clear and moist. No oropharyngeal exudate.  Eyes: Conjunctivae and EOM are normal. Pupils are equal, round, and reactive to light. No scleral icterus.  Neck: Normal range of  motion. Neck supple. No JVD present. No tracheal deviation present. No thyromegaly present.  Cardiovascular: Normal rate, regular rhythm and normal heart sounds.  Exam reveals no gallop and no friction rub.   No murmur heard. Pulmonary/Chest: Effort normal and breath sounds normal. No respiratory distress. She has no wheezes. She exhibits no tenderness.  Abdominal: Soft. Bowel sounds are normal. She exhibits no distension and no mass. There is no tenderness. There is no rebound and no guarding.  Musculoskeletal: Normal range of motion. She exhibits tenderness. She exhibits no edema.  Severe kyphosis. Tenderness to palpation left upper lumbar region paraspinally. Positive straight leg test on left. Neurovascularly intact.  Lymphadenopathy:    She has no cervical adenopathy.  Neurological: She is alert and oriented to person, place, and time. No cranial nerve deficit. She exhibits normal muscle tone.  Skin: Skin is warm and dry. No rash noted. No erythema. No pallor.  Nursing note and vitals reviewed.    ED Treatments / Results  DIAGNOSTIC STUDIES: Oxygen Saturation is 94% on RA, poor by my interpretation.    COORDINATION OF CARE: 7:03 PM Discussed treatment plan with pt at bedside which includes pain medication and pt agreed to plan.  Labs (all labs ordered are listed, but only abnormal results are displayed) Labs Reviewed - No data to display  EKG  EKG Interpretation None       Radiology Ct Renal Stone Study  Result Date: 05/11/2016 CLINICAL DATA:  Left flank and severe low back pain for 2 weeks. History of kidney stones. EXAM: CT ABDOMEN AND PELVIS WITHOUT CONTRAST TECHNIQUE: Multidetector CT imaging of the abdomen and pelvis was performed following the standard protocol without IV contrast. COMPARISON:  Lumbar spine radiographs 08/09/2010. Report from CT 03/18/2001. FINDINGS: Lower chest: Moderate to large hiatal hernia is incompletely visualized. There is linear atelectasis or  scarring at both lung bases. No significant pleural or pericardial effusion. There is atherosclerosis of the thoracic aorta. Hepatobiliary: There is a 16 mm low-density cyst in the dome of the right hepatic lobe on image 7. There is additional low density medially in the left lobe on image 16. No suspicious hepatic findings on noncontrast imaging. The gallbladder is incompletely distended. No evidence of calcified gallstones, surrounding inflammatory change or biliary dilatation. Pancreas: Unremarkable. No pancreatic ductal dilatation or surrounding inflammatory changes. Spleen: Normal in size without focal abnormality. Adrenals/Urinary Tract: Mild low-density prominence of both adrenal glands, likely due to hyperplasia. No suspicious adrenal findings. Both kidneys demonstrate mild cortical thinning. There is a 5 mm calcification in the upper pole the right kidney which may reflect a nonobstructing calculus. No other urinary tract calculi are seen. There is no evidence of hydronephrosis or perinephric soft tissue stranding. The bladder appears unremarkable. Stomach/Bowel: No evidence of bowel wall thickening, distention or surrounding inflammatory change. The cecum  appears mobile. The appendix appears normal. Mildly prominent stool throughout the colon. There are mild diverticular changes of the sigmoid colon. Vascular/Lymphatic: There are no enlarged abdominal or pelvic lymph nodes. Diffuse aortic and branch vessel atherosclerosis. Reproductive: The uterus and adnexa appear unremarkable for age. No evidence of adnexal mass or pelvic inflammatory process. Other: No evidence of abdominal wall mass or hernia. No ascites. Musculoskeletal: No acute or significant osseous findings. There are degenerative changes throughout the lumbar spine associated with a convex left scoliosis. IMPRESSION: 1. No acute findings or explanation for the patient's symptoms. 2. No evidence of hydronephrosis or left-sided urinary tract  calculus. Possible small nonobstructing calculus in the upper pole of the right kidney. 3. Moderate to large hiatal hernia. 4. Aortic and branch vessel atherosclerosis. Electronically Signed   By: Carey Bullocks M.D.   On: 05/11/2016 19:38    Procedures Procedures (including critical care time)  Medications Ordered in ED Medications - No data to display   Initial Impression / Assessment and Plan / ED Course  I have reviewed the triage vital signs and the nursing notes.  Pertinent labs & imaging results that were available during my care of the patient were reviewed by me and considered in my medical decision making (see chart for details).  Clinical Course    89yF with atraumatic back pain. Just recently seen for the same. NO acute change since last evaluation. No particularly concerning features aside from her age. Likely musculoskeletal with reproducibility on palpation and movement. Only taking tylenol. She certainly is of advanced age but, with the degree of discomfort she is having, I think it's reasonable to be a little more aggressive.   Final Clinical Impressions(s) / ED Diagnoses   Final diagnoses:  Left low back pain, unspecified chronicity, with sciatica presence unspecified    New Prescriptions New Prescriptions   No medications on file   I personally preformed the services scribed in my presence. The recorded information has been reviewed is accurate. Raeford Razor, MD.     Raeford Razor, MD 05/17/16 [phone]

## 2016-05-13 NOTE — ED Notes (Signed)
Patient & niece have decided against the dilaudid.

## 2016-05-14 NOTE — Telephone Encounter (Signed)
Pt's niece, Mindi JunkerMarsha notified.

## 2016-05-16 ENCOUNTER — Other Ambulatory Visit: Payer: Medicare Other

## 2016-05-21 ENCOUNTER — Emergency Department (HOSPITAL_COMMUNITY): Payer: Medicare Other

## 2016-05-21 ENCOUNTER — Encounter (HOSPITAL_COMMUNITY): Payer: Self-pay | Admitting: Emergency Medicine

## 2016-05-21 ENCOUNTER — Ambulatory Visit (INDEPENDENT_AMBULATORY_CARE_PROVIDER_SITE_OTHER): Payer: Medicare Other | Admitting: Family Medicine

## 2016-05-21 ENCOUNTER — Observation Stay (HOSPITAL_COMMUNITY)
Admission: EM | Admit: 2016-05-21 | Discharge: 2016-05-23 | Disposition: A | Discharge status: Skilled Nursing Facility | Payer: Medicare Other | Attending: Internal Medicine | Admitting: Internal Medicine

## 2016-05-21 VITALS — BP 122/77 | HR 101 | Temp 97.6°F | Resp 17

## 2016-05-21 DIAGNOSIS — R4182 Altered mental status, unspecified: Secondary | ICD-10-CM | POA: Diagnosis present

## 2016-05-21 DIAGNOSIS — M545 Low back pain: Secondary | ICD-10-CM | POA: Insufficient documentation

## 2016-05-21 DIAGNOSIS — R159 Full incontinence of feces: Secondary | ICD-10-CM | POA: Diagnosis not present

## 2016-05-21 DIAGNOSIS — R32 Unspecified urinary incontinence: Secondary | ICD-10-CM

## 2016-05-21 DIAGNOSIS — R410 Disorientation, unspecified: Secondary | ICD-10-CM | POA: Diagnosis not present

## 2016-05-21 DIAGNOSIS — M5136 Other intervertebral disc degeneration, lumbar region: Secondary | ICD-10-CM | POA: Diagnosis not present

## 2016-05-21 DIAGNOSIS — I700 Atherosclerosis of aorta: Secondary | ICD-10-CM | POA: Diagnosis not present

## 2016-05-21 DIAGNOSIS — R52 Pain, unspecified: Secondary | ICD-10-CM

## 2016-05-21 DIAGNOSIS — Z87442 Personal history of urinary calculi: Secondary | ICD-10-CM | POA: Insufficient documentation

## 2016-05-21 DIAGNOSIS — R0602 Shortness of breath: Secondary | ICD-10-CM | POA: Diagnosis not present

## 2016-05-21 DIAGNOSIS — M1990 Unspecified osteoarthritis, unspecified site: Secondary | ICD-10-CM | POA: Insufficient documentation

## 2016-05-21 DIAGNOSIS — M5126 Other intervertebral disc displacement, lumbar region: Secondary | ICD-10-CM | POA: Diagnosis not present

## 2016-05-21 DIAGNOSIS — E860 Dehydration: Secondary | ICD-10-CM | POA: Insufficient documentation

## 2016-05-21 DIAGNOSIS — K5900 Constipation, unspecified: Secondary | ICD-10-CM | POA: Insufficient documentation

## 2016-05-21 DIAGNOSIS — K449 Diaphragmatic hernia without obstruction or gangrene: Secondary | ICD-10-CM | POA: Diagnosis not present

## 2016-05-21 DIAGNOSIS — M419 Scoliosis, unspecified: Secondary | ICD-10-CM | POA: Diagnosis not present

## 2016-05-21 DIAGNOSIS — E1011 Type 1 diabetes mellitus with ketoacidosis with coma: Secondary | ICD-10-CM

## 2016-05-21 DIAGNOSIS — R531 Weakness: Secondary | ICD-10-CM | POA: Diagnosis not present

## 2016-05-21 DIAGNOSIS — I130 Hypertensive heart and chronic kidney disease with heart failure and stage 1 through stage 4 chronic kidney disease, or unspecified chronic kidney disease: Secondary | ICD-10-CM | POA: Insufficient documentation

## 2016-05-21 DIAGNOSIS — R55 Syncope and collapse: Secondary | ICD-10-CM

## 2016-05-21 DIAGNOSIS — Z882 Allergy status to sulfonamides status: Secondary | ICD-10-CM | POA: Insufficient documentation

## 2016-05-21 DIAGNOSIS — G9340 Encephalopathy, unspecified: Secondary | ICD-10-CM | POA: Diagnosis not present

## 2016-05-21 DIAGNOSIS — B9620 Unspecified Escherichia coli [E. coli] as the cause of diseases classified elsewhere: Secondary | ICD-10-CM | POA: Insufficient documentation

## 2016-05-21 DIAGNOSIS — G8929 Other chronic pain: Secondary | ICD-10-CM

## 2016-05-21 DIAGNOSIS — Z880 Allergy status to penicillin: Secondary | ICD-10-CM | POA: Diagnosis not present

## 2016-05-21 DIAGNOSIS — W010XXA Fall on same level from slipping, tripping and stumbling without subsequent striking against object, initial encounter: Secondary | ICD-10-CM | POA: Insufficient documentation

## 2016-05-21 DIAGNOSIS — I5033 Acute on chronic diastolic (congestive) heart failure: Secondary | ICD-10-CM | POA: Insufficient documentation

## 2016-05-21 DIAGNOSIS — I481 Persistent atrial fibrillation: Secondary | ICD-10-CM | POA: Diagnosis not present

## 2016-05-21 DIAGNOSIS — F419 Anxiety disorder, unspecified: Secondary | ICD-10-CM | POA: Diagnosis not present

## 2016-05-21 DIAGNOSIS — N179 Acute kidney failure, unspecified: Secondary | ICD-10-CM

## 2016-05-21 DIAGNOSIS — M48061 Spinal stenosis, lumbar region without neurogenic claudication: Secondary | ICD-10-CM | POA: Insufficient documentation

## 2016-05-21 DIAGNOSIS — M40204 Unspecified kyphosis, thoracic region: Secondary | ICD-10-CM | POA: Insufficient documentation

## 2016-05-21 DIAGNOSIS — I4819 Other persistent atrial fibrillation: Secondary | ICD-10-CM

## 2016-05-21 DIAGNOSIS — N3000 Acute cystitis without hematuria: Secondary | ICD-10-CM

## 2016-05-21 DIAGNOSIS — I4891 Unspecified atrial fibrillation: Secondary | ICD-10-CM | POA: Insufficient documentation

## 2016-05-21 DIAGNOSIS — Z9114 Patient's other noncompliance with medication regimen: Secondary | ICD-10-CM

## 2016-05-21 DIAGNOSIS — N390 Urinary tract infection, site not specified: Secondary | ICD-10-CM | POA: Diagnosis not present

## 2016-05-21 DIAGNOSIS — N183 Chronic kidney disease, stage 3 unspecified: Secondary | ICD-10-CM | POA: Diagnosis present

## 2016-05-21 DIAGNOSIS — Z7901 Long term (current) use of anticoagulants: Secondary | ICD-10-CM | POA: Diagnosis not present

## 2016-05-21 LAB — CBC WITH DIFFERENTIAL/PLATELET
Basophils Absolute: 0 10*3/uL (ref 0.0–0.1)
Basophils Relative: 0 %
Eosinophils Absolute: 0 10*3/uL (ref 0.0–0.7)
Eosinophils Relative: 0 %
HEMATOCRIT: 36.5 % (ref 36.0–46.0)
Hemoglobin: 12 g/dL (ref 12.0–15.0)
LYMPHS ABS: 1 10*3/uL (ref 0.7–4.0)
LYMPHS PCT: 14 %
MCH: 30 pg (ref 26.0–34.0)
MCHC: 32.9 g/dL (ref 30.0–36.0)
MCV: 91.3 fL (ref 78.0–100.0)
MONO ABS: 0.6 10*3/uL (ref 0.1–1.0)
MONOS PCT: 9 %
NEUTROS ABS: 5.5 10*3/uL (ref 1.7–7.7)
Neutrophils Relative %: 77 %
Platelets: 348 10*3/uL (ref 150–400)
RBC: 4 MIL/uL (ref 3.87–5.11)
RDW: 14 % (ref 11.5–15.5)
WBC: 7.1 10*3/uL (ref 4.0–10.5)

## 2016-05-21 LAB — COMPREHENSIVE METABOLIC PANEL
ALBUMIN: 3.5 g/dL (ref 3.5–5.0)
ALK PHOS: 63 U/L (ref 38–126)
ALT: 17 U/L (ref 14–54)
ANION GAP: 12 (ref 5–15)
AST: 18 U/L (ref 15–41)
BUN: 32 mg/dL — ABNORMAL HIGH (ref 6–20)
CO2: 26 mmol/L (ref 22–32)
Calcium: 8.9 mg/dL (ref 8.9–10.3)
Chloride: 99 mmol/L — ABNORMAL LOW (ref 101–111)
Creatinine, Ser: 1.74 mg/dL — ABNORMAL HIGH (ref 0.44–1.00)
GFR calc Af Amer: 29 mL/min — ABNORMAL LOW (ref >60)
GFR calc non Af Amer: 25 mL/min — ABNORMAL LOW (ref >60)
GLUCOSE: 87 mg/dL (ref 65–99)
POTASSIUM: 4 mmol/L (ref 3.5–5.1)
SODIUM: 137 mmol/L (ref 135–145)
Total Bilirubin: 1.2 mg/dL (ref 0.3–1.2)
Total Protein: 6.2 g/dL — ABNORMAL LOW (ref 6.5–8.1)

## 2016-05-21 LAB — URINALYSIS, ROUTINE W REFLEX MICROSCOPIC
Bilirubin Urine: NEGATIVE
GLUCOSE, UA: NEGATIVE mg/dL
KETONES UR: 15 mg/dL — AB
NITRITE: NEGATIVE
PROTEIN: NEGATIVE mg/dL
Specific Gravity, Urine: 1.013 (ref 1.005–1.030)
pH: 5.5 (ref 5.0–8.0)

## 2016-05-21 LAB — AMMONIA: AMMONIA: 10 umol/L (ref 9–35)

## 2016-05-21 LAB — URINE MICROSCOPIC-ADD ON

## 2016-05-21 LAB — ACETAMINOPHEN LEVEL: Acetaminophen (Tylenol), Serum: <10 ug/mL — ABNORMAL LOW (ref 10–30)

## 2016-05-21 MED ORDER — DILTIAZEM HCL ER COATED BEADS 120 MG PO CP24
120.0000 mg | ORAL_CAPSULE | Freq: Every day | ORAL | Status: DC
  Administered 2016-05-22 – 2016-05-23 (×2): 120 mg via ORAL
  Filled 2016-05-21 (×2): qty 1

## 2016-05-21 MED ORDER — SODIUM CHLORIDE 0.9 % IV SOLN: INTRAVENOUS | Status: DC

## 2016-05-21 MED ORDER — METOPROLOL SUCCINATE ER 50 MG PO TB24
50.0000 mg | ORAL_TABLET | Freq: Every day | ORAL | Status: DC
  Administered 2016-05-22 – 2016-05-23 (×2): 50 mg via ORAL
  Filled 2016-05-21 (×2): qty 1

## 2016-05-21 MED ORDER — DEXTROSE 5 % IV SOLN
1.0000 g | Freq: Every day | INTRAVENOUS | Status: DC
  Administered 2016-05-22 (×2): 1 g via INTRAVENOUS
  Filled 2016-05-21 (×3): qty 10

## 2016-05-21 MED ORDER — ACETAMINOPHEN 325 MG PO TABS
650.0000 mg | ORAL_TABLET | Freq: Once | ORAL | Status: AC
  Administered 2016-05-21: 650 mg via ORAL
  Filled 2016-05-21: qty 2

## 2016-05-21 MED ORDER — CEPHALEXIN 250 MG PO CAPS: 500.0000 mg | ORAL_CAPSULE | Freq: Two times a day (BID) | ORAL | Status: DC

## 2016-05-21 MED ORDER — ACETAMINOPHEN 500 MG PO TABS: 500.0000 mg | ORAL_TABLET | Freq: Four times a day (QID) | ORAL | Status: DC | PRN

## 2016-05-21 MED ORDER — CEPHALEXIN 250 MG PO CAPS
250.0000 mg | ORAL_CAPSULE | Freq: Once | ORAL | Status: AC
Start: 2016-05-21 — End: 2016-05-21
  Administered 2016-05-21: 250 mg via ORAL
  Filled 2016-05-21: qty 1

## 2016-05-21 MED ORDER — SODIUM CHLORIDE 0.9 % IV SOLN
INTRAVENOUS | Status: DC
  Administered 2016-05-21 – 2016-05-22 (×3): via INTRAVENOUS

## 2016-05-21 MED ORDER — RIVAROXABAN 15 MG PO TABS: 15.0000 mg | ORAL_TABLET | Freq: Every day | ORAL | Status: DC

## 2016-05-21 NOTE — Progress Notes (Signed)
Subjective:    Patient ID: Melanie Blake, female    DOB: 03/22/1924, 82 y.o.   MRN: [id number]  HPI Melanie Blake is a 82 y.o. female History of multiple medical problems as listed below including atrial fibrillation with RVR, CHF, and chronic kidney disease. Cardiologist is Dr. Shirlee LatchMcLean, with last office visit October 13. She has been seen in the emergency room first on October 22 and 24th with low back pain. She had a renal stone study on October 22nd without findings of hydronephrosis or explanation to the patient's symptoms. At the October 24 visit, thought the back pain was musculoskeletal due to reproducibility with palpation and movement. She was treated with tramadol 50 mg every 12 hours when necessary.  Chronic kidney disease:  Her creatinine had increased at cardiology visit October 13, so decreased Lasix to 40 mg daily. Prior range of 1.35-1.61, had increased to 1.84 two weeks ago, decreased back to 1.40 10 days ago at ER visit. CBC was normal at ER visit on the 22nd.  History of A. fib with RVR, acute on chronic diastolic CHF. Converted to normal sinus rhythm in June 2014, but has remained in atrial fibrillation since 2015. She is on diltiazem, Toprol, and Xarelto. NYHA class II symptoms based on last cardiology visit and volume appeared to be good based on cardiology visit October 13.  Did note nausea and diarrhea at that visit, thought to be gastroenteritis. Of note her heart rate at cardiology visit was 84, 59 at the ER visit on October 22, and 75 on October 24.  Today she presents with her niece,  with severe back pain at waist, same as visits to ER on the 22nd and 24th. Pain had improved some yesterday. Worse today. Did take 2total of 4 ultram 6 days ago.  Fell at home - fell onto her backside.  EMS arrived, evaluated her - able to walk. Unknown if she refused transport. Has had some difficulty defecating starting 1 week ago, had bowel movement on Sunday. Hard stool today. Not on  stool softeners. Complained of inability to urinate on 10/22. Had cath U/A on 10/22 - no sign of infection.   5 days ago - was found at home sitting on toilet naked, multiple soiled clothes with urine. Toilet paper throughout the home. Sunday (3 days ago) found again with toilet paper scattered around home, feces and urine on paper. (possible incontinent of urine 5 days ago, stool 3 days ago).   Niece notes in past 2 weeks - increased fear, anxiety, unable to perform ADL's.  Prior to that had overall been able to take care of herself, cook for herself, etc. Not eating, minimal fluid intake over past week. Crying off and on throughout the day. C/o back pain, not abd pain.  More sleepy past 2 weeks, withdrawn, doesn't want others in home besides her niece. Also noted some paranoia since about 4 weeks ago. She continually says they will take her retirement away, that neighbors are against her. Niece thinks she is taking her meds - niece puts meds out the night before.   Today - feels faint, dizzy, short of breath  - near syncope in our lobby. Had to be brought back by wheelchair. No tramadol today - last dose tramadol last week.  Tylenol today 500mg  early this am. Not having chest pain,   Patient lives alone. Niece Melanie Junker(Marsha) checks on her 3x/day. No PCP. Cards: Dr. Shirlee LatchMcLean.    Patient Active Problem List  Diagnosis Date Noted  . CKD (chronic kidney disease) 11/13/2013  . Atrial fibrillation (HCC) 11/11/2012  . Chronic diastolic CHF (congestive heart failure) (HCC) 11/11/2012  . Atrial fibrillation with RVR (HCC) 10/28/2012  . Acute diastolic CHF (congestive heart failure) (HCC) 10/28/2012  . Elevated TSH 10/28/2012   Past Medical History:  Diagnosis Date  . Atrial fibrillation (HCC)    dx 10/2012; s/p DCCV => NSR 12/23/2012  . Cataract   . Chronic diastolic heart failure (HCC)    a. Echo 4/14: EF "grossly normal", mild AI, mild MR, PASP 40, mild reduced RVSF   Past Surgical History:  Procedure  Laterality Date  . CARDIOVERSION N/A 12/23/2012   Procedure: CARDIOVERSION;  Surgeon: Laurey Morale, MD;  Location: [facility] ENDOSCOPY;  Service: Cardiovascular;  Laterality: N/A;   Allergies  Allergen Reactions  . Penicillins Swelling  . Sulfa Antibiotics Hives and Swelling   Prior to Admission medications   Medication Sig Start Date End Date Taking? Authorizing Provider  acetaminophen (TYLENOL) 500 MG tablet Take 1,000 mg by mouth every 6 (six) hours as needed for moderate pain.   Yes Historical Provider, MD  diltiazem (CARDIZEM CD) 120 MG 24 hr capsule Take 1 capsule (120 mg total) by mouth daily. 05/02/16  Yes Laurey Morale, MD  furosemide (LASIX) 40 MG tablet TAKE 1 TABLET BY MOUTH EVERY MORNING AND 1/2 TABLET EVERY AFTERNOON AS DIRECTED (WILL PAY 3/1) Patient taking differently: Take 40 mg by mouth daily.  05/02/16  Yes Laurey Morale, MD  metoprolol succinate (TOPROL-XL) 50 MG 24 hr tablet TAKE 1 TABLET (50 MG TOTAL) BY MOUTH DAILY. TAKE WITH OR IMMEDIATELY FOLLOWING A MEAL. 05/02/16  Yes Laurey Morale, MD  potassium chloride (KLOR-CON M10) 10 MEQ tablet Take 1 tablet (10 mEq total) by mouth daily. 05/02/16  Yes Laurey Morale, MD  Rivaroxaban (XARELTO) 15 MG TABS tablet TAKE 1 TABLET (15 MG TOTAL) BY MOUTH DAILY. 05/02/16  Yes Laurey Morale, MD  traMADol (ULTRAM) 50 MG tablet Take 1 tablet (50 mg total) by mouth every 12 (twelve) hours as needed. 05/13/16  Yes Raeford Razor, MD   Social History   Social History  . Marital status: Widowed    Spouse name: N/A  . Number of children: N/A  . Years of education: N/A   Occupational History  . Not on file.   Social History Main Topics  . Smoking status: Never Smoker  . Smokeless tobacco: Never Used  . Alcohol use No  . Drug use: No  . Sexual activity: No   Other Topics Concern  . Not on file   Social History Narrative  . No narrative on file    Review of Systems  Constitutional: Positive for activity change, appetite  change and fatigue. Negative for fever.  Respiratory: Positive for shortness of breath. Negative for cough, wheezing and stridor.   Cardiovascular: Negative for chest pain, palpitations and leg swelling.  Gastrointestinal: Positive for constipation (As well as stool incontinence and urinary incontinence as above). Negative for abdominal pain.  Musculoskeletal: Positive for back pain and gait problem.  Neurological: Positive for dizziness, weakness and light-headedness. Negative for seizures, syncope, facial asymmetry and speech difficulty.  Psychiatric/Behavioral: Positive for confusion.   As in HPI.     Objective:   Physical Exam  Constitutional: She is oriented to person, place, and time. She appears distressed (Appears uncomfortable at times throughout exam, alternating with moments where she appears comfortable and responding appropriately.).  HENT:  Head: Normocephalic and atraumatic.  Eyes: Conjunctivae and EOM are normal. Pupils are equal, round, and reactive to light.  Neck: Carotid bruit is not present.  Cardiovascular: Intact distal pulses.  An irregularly irregular rhythm present.  Murmur heard.  Systolic murmur is present with a grade of 3/6  Pulmonary/Chest: Effort normal and breath sounds normal.  Abdominal: Soft. She exhibits no pulsatile midline mass. There is no tenderness.  Musculoskeletal:       Back:      Neurological: She is alert and oriented to person, place, and time. She has normal strength.  No focal weakness appreciated. Including arms and legs. No apparent facial droop. Remainder exam limited as she is supine on exam table and uncomfortable with movement.  Skin: Skin is warm and dry. She is not diaphoretic.  Psychiatric:  Pleasant, response overall appropriate to questions, other times some difficulty with responses.   Vitals reviewed.  Vitals:   05/21/16 1325  BP: 122/77  Pulse: (!) 101  Resp: 17  Temp: 97.6 F (36.4 C)  TempSrc: Oral  SpO2: 96%    EKG atrial fibrillation/flutter overall rate 94. Otherwise no apparent changes from previous EKG on October 13.      Assessment & Plan:  MARJARIE IRION is a 82 y.o. female Delirium  Near syncope - Plan: EKG 12-Lead  Midline low back pain, unspecified chronicity, with sciatica presence unspecified  Incontinence of feces, unspecified fecal incontinence type  Urinary incontinence, unspecified type  Shortness of breath - Plan: EKG 12-Lead  Persistent atrial fibrillation (HCC)   History of atrial fibrillation, CHF, other chronic medical problems as above. Past few weeks with some increased paranoia, then 2 ER visits with back pain, persistent/worsening back pain, now with 2 episodes of stool then urinary incontinence. Fall after overuse of tramadol.  Pain has worsened to the point where she is no longer able to complete ADLs, increase in fatigue, decreased po intake, and some signs of possible delirium recently. Today symptoms have worsened to the point of shortness of breath, dizziness, near syncope. Differential includes metabolic normality with previous chronic kidney disease and decreased po intake/fluid intake, infection causing delirium, and with back pain now having stool/urinary incontinence, concerning for infection vs cauda equina in lumbar spine. recenet fall - may also have compression fx.   -EKG obtained as above, atrial fibrillation. Blood pressure stable  - EMS called for transport to ER for further evaluation given acute change in symptoms today and worsening over past 2 weeks.  2:44 PM Report given to EMS with transfer of care.  Charge nurse advised.   No orders of the defined types were placed in this encounter.  Patient Instructions    Due to the worsening of your symptoms as well as acute change in symptoms today, further evaluation through the emergency room is needed and possible hospital admission.    IF you received an x-ray today, you will receive an  invoice from [facility]. Please contact [facility] at [phone] with questions or concerns regarding your invoice.   IF you received labwork today, you will receive an invoice from [employer]. Please contact [facility] at [phone] with questions or concerns regarding your invoice.   Our billing staff will not be able to assist you with questions regarding bills from these companies.  You will be contacted with the lab results as soon as they are available. The fastest way to get your results is to activate your My Chart account. Instructions are  located on the last page of this paperwork. If you have not heard from us regarding the results in 2 weeks, please contact this office.        I personally performed the services described in this documentation, which was scribed in my presence. The recorded information has been reviewed and considered, and addended by me as needed.  Signed,   Meredith StaggersJeffrey Waneta Fitting, MD [facility].  05/21/16 2:29 PM

## 2016-05-21 NOTE — ED Provider Notes (Signed)
[facility] DEPT Provider Note   CSN: [id number] Arrival date & time: 05/21/16  1530     History   Chief Complaint Chief Complaint  Patient presents with  . Weakness  . Back Pain    HPI Melanie Blake is a 82 y.o. female.  She presents for evaluation of low back pain, increasing confusion, fearfulness, anxiety, being withdrawn, and lethargic. Onset of symptoms, is unclear. Today she was at an urgent care office for evaluation. When she felt weak, near syncopal and was therefore sent here for evaluation. She does not currently have a PCP.  She is here today with her niece who is the primary historian, at his request. The patient is able to answer questions, but tends to defer to her niece. Is concerned about the patient's increasing difficulty with managing herself at home including getting around the house, walking, eating, as well as having both bowel and urinary incontinence, and occasional constipation or stool. The niece is unable to be specific about timing for the difficulties that the patient is having, but it is probably in the range of about 3 weeks. She apparently slipped and fell several days ago, was evaluated by EMS, but was not transferred and has not been evaluated for that fall yet. The doctor that saw her today was concern for cauda equina syndrome. Because of the incontinence and low back pain. She has not had advanced imaging, or really any imaging of the back, abdomen, and the recent CT, on 05/11/2016. Patient's niece states that she believes the patient is fearful of a fall, but seems "paranoid", regarding persons including her neighbors who may be "out to get her." There are no other known modifying factors.  HPI  Past Medical History:  Diagnosis Date  . Atrial fibrillation (HCC)    dx 10/2012; s/p DCCV => NSR 12/23/2012  . Cataract   . Chronic diastolic heart failure (HCC)    a. Echo 4/14: EF "grossly normal", mild AI, mild MR, PASP 40, mild reduced RVSF     Patient Active Problem List   Diagnosis Date Noted  . CKD (chronic kidney disease) 11/13/2013  . Atrial fibrillation (HCC) 11/11/2012  . Chronic diastolic CHF (congestive heart failure) (HCC) 11/11/2012  . Atrial fibrillation with RVR (HCC) 10/28/2012  . Acute diastolic CHF (congestive heart failure) (HCC) 10/28/2012  . Elevated TSH 10/28/2012    Past Surgical History:  Procedure Laterality Date  . CARDIOVERSION N/A 12/23/2012   Procedure: CARDIOVERSION;  Surgeon: Laurey Moralealton S McLean, MD;  [facility];  Service: Cardiovascular;  Laterality: N/A;    OB History    No data available       Home Medications    Prior to Admission medications   Medication Sig Start Date End Date Taking? Authorizing Provider  acetaminophen (TYLENOL) 500 MG tablet Take 500 mg by mouth every 6 (six) hours as needed for moderate pain.    Yes Historical Provider, MD  diltiazem (CARDIZEM CD) 120 MG 24 hr capsule Take 1 capsule (120 mg total) by mouth daily. 05/02/16  Yes Laurey Moralealton S McLean, MD  furosemide (LASIX) 40 MG tablet TAKE 1 TABLET BY MOUTH EVERY MORNING AND 1/2 TABLET EVERY AFTERNOON AS DIRECTED (WILL PAY 3/1) Patient taking differently: Take 40 mg by mouth daily.  05/02/16  Yes Laurey Moralealton S McLean, MD  metoprolol succinate (TOPROL-XL) 50 MG 24 hr tablet TAKE 1 TABLET (50 MG TOTAL) BY MOUTH DAILY. TAKE WITH OR IMMEDIATELY FOLLOWING A MEAL. 05/02/16  Yes Laurey Moralealton S McLean, MD  potassium chloride (KLOR-CON M10) 10 MEQ tablet Take 1 tablet (10 mEq total) by mouth daily. 05/02/16  Yes Laurey Moralealton S McLean, MD  Rivaroxaban (XARELTO) 15 MG TABS tablet TAKE 1 TABLET (15 MG TOTAL) BY MOUTH DAILY. 05/02/16  Yes Laurey Moralealton S McLean, MD  traMADol (ULTRAM) 50 MG tablet Take 1 tablet (50 mg total) by mouth every 12 (twelve) hours as needed. Patient not taking: Reported on 05/21/2016 05/13/16   Raeford RazorStephen Kohut, MD    Family History Family History  Problem Relation Age of Onset  . Heart attack Neg Hx     Social  History Social History  Substance Use Topics  . Smoking status: Never Smoker  . Smokeless tobacco: Never Used  . Alcohol use No     Allergies   Penicillins and Sulfa antibiotics   Review of Systems Review of Systems  All other systems reviewed and are negative.    Physical Exam Updated Vital Signs BP 119/59   Pulse 104   Temp 97.9 F (36.6 C) (Oral)   Resp 22   SpO2 96%   Physical Exam  Constitutional: She is oriented to person, place, and time. She appears well-developed.  Elderly, frail  HENT:  Head: Normocephalic and atraumatic.  Eyes: Conjunctivae and EOM are normal. Pupils are equal, round, and reactive to light.  Neck: Normal range of motion and phonation normal. Neck supple.  Cardiovascular: Normal rate and regular rhythm.   Pulmonary/Chest: Effort normal and breath sounds normal. She exhibits no tenderness.  Abdominal: Soft. She exhibits no distension. There is no tenderness. There is no guarding.  Musculoskeletal: Normal range of motion.  Kyphosis with scoliosis of the thoracic spine. Normal range of motion, arms and legs bilaterally. Mild diffuse tenderness lumbar region.  Neurological: She is alert and oriented to person, place, and time. She exhibits normal muscle tone.  Skin: Skin is warm and dry.  Psychiatric: She has a normal mood and affect. Her behavior is normal.  Nursing note and vitals reviewed.    ED Treatments / Results  Labs (all labs ordered are listed, but only abnormal results are displayed) Labs Reviewed  COMPREHENSIVE METABOLIC PANEL - Abnormal; Notable for the following:       Result Value   Chloride 99 (*)    BUN 32 (*)    Creatinine, Ser 1.74 (*)    Total Protein 6.2 (*)    GFR calc non Af Amer 25 (*)    GFR calc Af Amer 29 (*)    All other components within normal limits  ACETAMINOPHEN LEVEL - Abnormal; Notable for the following:    Acetaminophen (Tylenol), Serum <10 (*)    All other components within normal limits   URINALYSIS, ROUTINE W REFLEX MICROSCOPIC (NOT AT [facility]) - Abnormal; Notable for the following:    APPearance CLOUDY (*)    Hgb urine dipstick LARGE (*)    Ketones, ur 15 (*)    Leukocytes, UA MODERATE (*)    All other components within normal limits  URINE MICROSCOPIC-ADD ON - Abnormal; Notable for the following:    Squamous Epithelial / LPF 0-5 (*)    Bacteria, UA FEW (*)    Casts HYALINE CASTS (*)    All other components within normal limits  URINE CULTURE  CBC WITH DIFFERENTIAL/PLATELET  AMMONIA    EKG  EKG Interpretation None       Radiology Dg Chest 2 View  Result Date: 05/21/2016 CLINICAL DATA:  Cough, shortness of breath, and weakness beginning yesterday. EXAM:  CHEST  2 VIEW COMPARISON:  10/27/2012 FINDINGS: Cardiomegaly and ectasia thoracic aorta show no significant change. Aortic atherosclerosis. Linear opacity is seen in the left lung base, which may be due to scarring or atelectasis. No evidence of pulmonary consolidation or edema. No evidence of pneumothorax or pleural effusion. Thoracic kyphosis noted, with lower thoracic vertebral body compression fracture deformity. IMPRESSION: Mild left basilar scarring versus atelectasis. No evidence of pulmonary airspace disease or pleural effusion. Lower thoracic vertebral body compression fracture deformity, of indeterminate age. Stable cardiomegaly.  Aortic atherosclerosis. Electronically Signed   By: Myles Rosenthal M.D.   On: 05/21/2016 16:54   Dg Lumbar Spine Complete  Result Date: 05/21/2016 CLINICAL DATA:  Lumbago EXAM: LUMBAR SPINE - COMPLETE 4+ VIEW COMPARISON:  August 17, 2010 FINDINGS: Frontal, lateral, spot lumbosacral lateral, and bilateral oblique views were obtained. There are 5 non-rib-bearing lumbar type vertebral bodies. There is lumbar levoscoliosis. There is no acute fracture or spondylolisthesis. There is moderate disc space narrowing at L1-2 with vacuum phenomenon. There is moderate disc space narrowing at all  other levels, essentially stable. There is facet osteoarthritic change at all levels bilaterally. There is atherosclerotic calcification in the aorta. IMPRESSION: Persistent multilevel arthropathy. Scoliosis. No acute fracture or spondylolisthesis. There is aortic atherosclerosis. Electronically Signed   By: Bretta Bang III M.D.   On: 05/21/2016 16:54   Mr Lumbar Spine Wo Contrast  Result Date: 05/21/2016 CLINICAL DATA:  Generalized weakness for 2 weeks.  Low back pain. EXAM: MRI LUMBAR SPINE WITHOUT CONTRAST TECHNIQUE: Multiplanar, multisequence MR imaging of the lumbar spine was performed. No intravenous contrast was administered. COMPARISON:  Lumbar spine radiograph 05/21/2016 FINDINGS: Segmentation: Normal. The lowest disc space is considered to be L5-S1. Alignment: Possible trace retrolisthesis at L2-L3. There is left convex lumbar scoliosis with apex at the L2-L3 level. Vertebrae: No acute compression fracture, facet edema or focal marrow lesion. Conus medullaris: Extends to the L2 level and appears normal. Paraspinal and other soft tissues: The visualized aorta, IVC and iliac vessels are normal. The visualized retroperitoneal organs and paraspinal soft tissues are normal. Disc levels: T12-L1: Minimal disc herniation. No spinal canal or neural foraminal stenosis. L1-L2: Narrowing of the intervertebral disc space without significant bulge. No spinal canal stenosis. Mild right neuroforaminal stenosis. L2-L3: Narrowing of the intervertebral disc space with endplate osteophytosis, greater on the right. No significant disc bulge. Normal facets. No spinal canal stenosis. Moderate right neuroforaminal stenosis. L3-L4: Normal disc space and facet joints. No spinal canal stenosis. No neuroforaminal stenosis. L4-L5: There is moderate left facet hypertrophy. Small disc bulge. No spinal canal stenosis. Moderate to severe left neuroforaminal stenosis. L5-S1: Minimal disc bulge. Mild facet hypertrophy. No spinal  canal stenosis. No neuroforaminal stenosis. IMPRESSION: 1. Moderate to severe left neural foraminal stenosis at L4-L5, primarily due to left facet overgrowth. 2. Multilevel degenerative disc dizziness without spinal canal stenosis. 3. Moderate right L2-L3 foraminal stenosis due to combination of scoliosis and endplate osteophytosis. Electronically Signed   By: Deatra Robinson M.D.   On: 05/21/2016 21:17    Procedures Procedures (including critical care time)  Medications Ordered in ED Medications  0.9 %  sodium chloride infusion ( Intravenous New Bag/Given 05/21/16 2116)  0.9 %  sodium chloride infusion (not administered)  acetaminophen (TYLENOL) tablet 650 mg (650 mg Oral Given 05/21/16 1951)  cephALEXin (KEFLEX) capsule 250 mg (250 mg Oral Given 05/21/16 2313)     Initial Impression / Assessment and Plan / ED Course  I have reviewed the triage  vital signs and the nursing notes.  Pertinent labs & imaging results that were available during my care of the patient were reviewed by me and considered in my medical decision making (see chart for details).  Clinical Course  Value Comment By Time   Patient is lying back supine. Now, more comfortable. She does want something for pain. Tylenol ordered. She's been able to drink some liquids, but declines food at this time. Initial imaging, plain, for fracture, is negative. Will evaluate further with MRI lumbar. Mancel Bale, MD 11/01 1911  MR LUMBAR SPINE WO CONTRAST No cord/canal abnormality Mancel Bale, MD 11/01 2150  Hgb urine dipstick: (!) LARGE high Mancel Bale, MD 11/01 2151  Leukocytes, UA: (!) MODERATE high Mancel Bale, MD 11/01 2152  WBC, UA: 6-30 high Mancel Bale, MD 11/01 2152  Bacteria, UA: (!) FEW high Mancel Bale, MD 11/01 2153    Medications  0.9 %  sodium chloride infusion ( Intravenous New Bag/Given 05/21/16 2116)  0.9 %  sodium chloride infusion (not administered)  acetaminophen (TYLENOL) tablet 650 mg (650 mg Oral Given  05/21/16 1951)  cephALEXin (KEFLEX) capsule 250 mg (250 mg Oral Given 05/21/16 2313)    Patient Vitals for the past 24 hrs:  BP Temp Temp src Pulse Resp SpO2  05/21/16 2303 119/59 - - 104 22 96 %  05/21/16 2215 101/61 - - 85 20 96 %  05/21/16 2115 97/60 - - (!) 47 17 99 %  05/21/16 2000 114/66 - - 67 - 100 %  05/21/16 1945 103/74 - - 94 - 99 %  05/21/16 1930 (!) 97/54 - - 71 - 99 %  05/21/16 1915 105/62 - - 74 18 99 %  05/21/16 1533 113/85 97.9 F (36.6 C) Oral 94 18 100 %    CHA2DS2/VAS Stroke Risk Points      5 >= 2 Points: High Risk  1 - 1.99 Points: Medium Risk  0 Points: Low Risk    The previous score was 4 on 02/14/2016.:  Change:         Details    Note: External data might be a factor in metrics not marked with    Points Metrics   This score determines the patient's risk of having a stroke if the  patient has atrial fibrillation.       1 Has Congestive Heart Failure:  Yes   0 Has Vascular Disease:  No   1 Has Hypertension:  Yes   2 Age:  82   0 Has Diabetes:  No   0 Had Stroke:  No Had TIA:  No Had thromboembolism:  No   1 Female:  Yes         11:18 PM Reevaluation with update and discussion. After initial assessment and treatment, an updated evaluation reveals She remains alert and comfortable. Ambulation trial done, patient able to walk only about 12 feet before she had to sit down because of "lightheadedness." Findings discussed with patient and her niece, they prefer to stay here because of "she might fall". He understands that the patient can only be admitted for observation, with reassessment and treatment to be expectant. Richardine Peppers L    11:21 PM-Consult complete with Dr. Julian Reil. Patient case explained and discussed. He agrees to admit patient for further evaluation and treatment. Call ended at 23:40  Final Clinical Impressions(s) / ED Diagnoses   Final diagnoses:  Pain  Lower urinary tract infectious disease  Dehydration  Chronic midline low back  pain without  sciatica  Osteoarthritis, unspecified osteoarthritis type, unspecified site   Ongoing low back pain, with exacerbation, following fall several days ago. Doubt fracture, lumbar myelopathy, discitis, metabolic instability or serious bacterial infection. Possible mild dehydration contributing to disability, with walking.  Nursing Notes Reviewed/ Care Coordinated, and agree without changes. Applicable Imaging Reviewed.  Interpretation of Laboratory Data incorporated into ED treatment  Plan: Admit   New Prescriptions New Prescriptions   No medications on file     Mancel Bale, MD 05/21/16 2345

## 2016-05-21 NOTE — ED Notes (Signed)
Delay in lab draw,  Pt not in room 

## 2016-05-21 NOTE — H&P (Signed)
History and Physical    Melanie Blake ZOX:[id number] DOB: 11/03/1923 DOA: 05/21/2016   PCP: No PCP Per Patient Chief Complaint:  Chief Complaint  Patient presents with  . Weakness  . Back Pain    HPI: Melanie Blake is a 82 y.o. female with medical history significant of A.Fib.  Patient presents to the ED with niece who brings her in for ~3 weeks of progressive mental and physical decline at home.  Patient previously has lived alone.  Over past 3 weeks she has had progressive generalized weakness, increasing difficulty with managing herself, increasing confusion, and finally bowel and urinary incontinence.  Patient has also been paranoid recently per niece.  Patient also has low back pain, so doc who saw her today was concerned for cauda-equina and had her sent to the ED.  ED Course: MRI negative for cauda equina.    Review of Systems: As per HPI otherwise 10 point review of systems negative.    Past Medical History:  Diagnosis Date  . Atrial fibrillation (HCC)    dx 10/2012; s/p DCCV => NSR 12/23/2012  . Cataract   . Chronic diastolic heart failure (HCC)    a. Echo 4/14: EF "grossly normal", mild AI, mild MR, PASP 40, mild reduced RVSF    Past Surgical History:  Procedure Laterality Date  . CARDIOVERSION N/A 12/23/2012   Procedure: CARDIOVERSION;  Surgeon: Laurey Morale, MD;  Location: [facility] ENDOSCOPY;  Service: Cardiovascular;  Laterality: N/A;     reports that she has never smoked. She has never used smokeless tobacco. She reports that she does not drink alcohol or use drugs.  Allergies  Allergen Reactions  . Penicillins Swelling  . Sulfa Antibiotics Hives and Swelling    Family History  Problem Relation Age of Onset  . Heart attack Neg Hx       Prior to Admission medications   Medication Sig Start Date End Date Taking? Authorizing Provider  acetaminophen (TYLENOL) 500 MG tablet Take 500 mg by mouth every 6 (six) hours as needed for moderate pain.    Yes  Historical Provider, MD  diltiazem (CARDIZEM CD) 120 MG 24 hr capsule Take 1 capsule (120 mg total) by mouth daily. 05/02/16  Yes Laurey Morale, MD  furosemide (LASIX) 40 MG tablet TAKE 1 TABLET BY MOUTH EVERY MORNING AND 1/2 TABLET EVERY AFTERNOON AS DIRECTED (WILL PAY 3/1) Patient taking differently: Take 40 mg by mouth daily.  05/02/16  Yes Laurey Morale, MD  metoprolol succinate (TOPROL-XL) 50 MG 24 hr tablet TAKE 1 TABLET (50 MG TOTAL) BY MOUTH DAILY. TAKE WITH OR IMMEDIATELY FOLLOWING A MEAL. 05/02/16  Yes Laurey Morale, MD  potassium chloride (KLOR-CON M10) 10 MEQ tablet Take 1 tablet (10 mEq total) by mouth daily. 05/02/16  Yes Laurey Morale, MD  Rivaroxaban (XARELTO) 15 MG TABS tablet TAKE 1 TABLET (15 MG TOTAL) BY MOUTH DAILY. 05/02/16  Yes Laurey Morale, MD    Physical Exam: Vitals:   05/21/16 2000 05/21/16 2115 05/21/16 2215 05/21/16 2303  BP: 114/66 97/60 101/61 119/59  Pulse: 67 (!) 47 85 104  Resp:  17 20 22   Temp:      TempSrc:      SpO2: 100% 99% 96% 96%      Constitutional: NAD, calm, comfortable, elderly, frail Eyes: PERRL, lids and conjunctivae normal ENMT: Mucous membranes are moist. Posterior pharynx clear of any exudate or lesions.Normal dentition.  Neck: normal, supple, no masses, no thyromegaly Respiratory: clear  to auscultation bilaterally, no wheezing, no crackles. Normal respiratory effort. No accessory muscle use.  Cardiovascular: Regular rate and rhythm, no murmurs / rubs / gallops. No extremity edema. 2+ pedal pulses. No carotid bruits.  Abdomen: no tenderness, no masses palpated. No hepatosplenomegaly. Bowel sounds positive.  Musculoskeletal: no clubbing / cyanosis. No joint deformity upper and lower extremities. Good ROM, no contractures. Normal muscle tone.  Skin: no rashes, lesions, ulcers. No induration Neurologic: CN 2-12 grossly intact. Sensation intact, DTR normal. Strength 5/5 in all 4.  Psychiatric: Normal judgment and insight. Alert  and oriented x 3. Normal mood.    Labs on Admission: I have personally reviewed following labs and imaging studies  CBC:  Recent Labs Lab 05/21/16 1715  WBC 7.1  NEUTROABS 5.5  HGB 12.0  HCT 36.5  MCV 91.3  PLT 348   Basic Metabolic Panel:  Recent Labs Lab 05/21/16 1715  NA 137  K 4.0  CL 99*  CO2 26  GLUCOSE 87  BUN 32*  CREATININE 1.74*  CALCIUM 8.9   GFR: Estimated Creatinine Clearance: 13.9 mL/min (by C-G formula based on SCr of 1.74 mg/dL (H)). Liver Function Tests:  Recent Labs Lab 05/21/16 1715  AST 18  ALT 17  ALKPHOS 63  BILITOT 1.2  PROT 6.2*  ALBUMIN 3.5   No results for input(s): LIPASE, AMYLASE in the last 168 hours.  Recent Labs Lab 05/21/16 1715  AMMONIA 10   Coagulation Profile: No results for input(s): INR, PROTIME in the last 168 hours. Cardiac Enzymes: No results for input(s): CKTOTAL, CKMB, CKMBINDEX, TROPONINI in the last 168 hours. BNP (last 3 results) No results for input(s): PROBNP in the last 8760 hours. HbA1C: No results for input(s): HGBA1C in the last 72 hours. CBG: No results for input(s): GLUCAP in the last 168 hours. Lipid Profile: No results for input(s): CHOL, HDL, LDLCALC, TRIG, CHOLHDL, LDLDIRECT in the last 72 hours. Thyroid Function Tests: No results for input(s): TSH, T4TOTAL, FREET4, T3FREE, THYROIDAB in the last 72 hours. Anemia Panel: No results for input(s): VITAMINB12, FOLATE, FERRITIN, TIBC, IRON, RETICCTPCT in the last 72 hours. Urine analysis:    Component Value Date/Time   COLORURINE YELLOW 05/21/2016 2118   APPEARANCEUR CLOUDY (A) 05/21/2016 2118   LABSPEC 1.013 05/21/2016 2118   PHURINE 5.5 05/21/2016 2118   GLUCOSEU NEGATIVE 05/21/2016 2118   HGBUR LARGE (A) 05/21/2016 2118   BILIRUBINUR NEGATIVE 05/21/2016 2118   KETONESUR 15 (A) 05/21/2016 2118   PROTEINUR NEGATIVE 05/21/2016 2118   UROBILINOGEN 0.2 08/17/2010 1145   NITRITE NEGATIVE 05/21/2016 2118   LEUKOCYTESUR MODERATE (A)  05/21/2016 2118   Sepsis Labs: @LABRCNTIP (procalcitonin:4,lacticidven:4) )No results found for this or any previous visit (from the past 240 hour(s)).   Radiological Exams on Admission: Dg Chest 2 View  Result Date: 05/21/2016 CLINICAL DATA:  Cough, shortness of breath, and weakness beginning yesterday. EXAM: CHEST  2 VIEW COMPARISON:  10/27/2012 FINDINGS: Cardiomegaly and ectasia thoracic aorta show no significant change. Aortic atherosclerosis. Linear opacity is seen in the left lung base, which may be due to scarring or atelectasis. No evidence of pulmonary consolidation or edema. No evidence of pneumothorax or pleural effusion. Thoracic kyphosis noted, with lower thoracic vertebral body compression fracture deformity. IMPRESSION: Mild left basilar scarring versus atelectasis. No evidence of pulmonary airspace disease or pleural effusion. Lower thoracic vertebral body compression fracture deformity, of indeterminate age. Stable cardiomegaly.  Aortic atherosclerosis. Electronically Signed   By: Myles Rosenthal M.D.   On: 05/21/2016 16:54  Dg Lumbar Spine Complete  Result Date: 05/21/2016 CLINICAL DATA:  Lumbago EXAM: LUMBAR SPINE - COMPLETE 4+ VIEW COMPARISON:  August 17, 2010 FINDINGS: Frontal, lateral, spot lumbosacral lateral, and bilateral oblique views were obtained. There are 5 non-rib-bearing lumbar type vertebral bodies. There is lumbar levoscoliosis. There is no acute fracture or spondylolisthesis. There is moderate disc space narrowing at L1-2 with vacuum phenomenon. There is moderate disc space narrowing at all other levels, essentially stable. There is facet osteoarthritic change at all levels bilaterally. There is atherosclerotic calcification in the aorta. IMPRESSION: Persistent multilevel arthropathy. Scoliosis. No acute fracture or spondylolisthesis. There is aortic atherosclerosis. Electronically Signed   By: Bretta BangWilliam  Woodruff III M.D.   On: 05/21/2016 16:54   Mr Lumbar Spine Wo  Contrast  Result Date: 05/21/2016 CLINICAL DATA:  Generalized weakness for 2 weeks.  Low back pain. EXAM: MRI LUMBAR SPINE WITHOUT CONTRAST TECHNIQUE: Multiplanar, multisequence MR imaging of the lumbar spine was performed. No intravenous contrast was administered. COMPARISON:  Lumbar spine radiograph 05/21/2016 FINDINGS: Segmentation: Normal. The lowest disc space is considered to be L5-S1. Alignment: Possible trace retrolisthesis at L2-L3. There is left convex lumbar scoliosis with apex at the L2-L3 level. Vertebrae: No acute compression fracture, facet edema or focal marrow lesion. Conus medullaris: Extends to the L2 level and appears normal. Paraspinal and other soft tissues: The visualized aorta, IVC and iliac vessels are normal. The visualized retroperitoneal organs and paraspinal soft tissues are normal. Disc levels: T12-L1: Minimal disc herniation. No spinal canal or neural foraminal stenosis. L1-L2: Narrowing of the intervertebral disc space without significant bulge. No spinal canal stenosis. Mild right neuroforaminal stenosis. L2-L3: Narrowing of the intervertebral disc space with endplate osteophytosis, greater on the right. No significant disc bulge. Normal facets. No spinal canal stenosis. Moderate right neuroforaminal stenosis. L3-L4: Normal disc space and facet joints. No spinal canal stenosis. No neuroforaminal stenosis. L4-L5: There is moderate left facet hypertrophy. Small disc bulge. No spinal canal stenosis. Moderate to severe left neuroforaminal stenosis. L5-S1: Minimal disc bulge. Mild facet hypertrophy. No spinal canal stenosis. No neuroforaminal stenosis. IMPRESSION: 1. Moderate to severe left neural foraminal stenosis at L4-L5, primarily due to left facet overgrowth. 2. Multilevel degenerative disc dizziness without spinal canal stenosis. 3. Moderate right L2-L3 foraminal stenosis due to combination of scoliosis and endplate osteophytosis. Electronically Signed   By: Deatra RobinsonKevin  Herman M.D.    On: 05/21/2016 21:17    EKG: Independently reviewed.  Assessment/Plan Principal Problem:   Generalized weakness Active Problems:   Atrial fibrillation (HCC)   CKD (chronic kidney disease)   UTI (urinary tract infection)   Altered mental status    1. Generalized weakness / AMS - 1. MRI neg for cauda equina 2. Does have mild UTI, will put on rocephin for treatment 3. Concerned for possibly progressive dementia, ? NPH given the incontinence and rapid progression ? 1. Get CT head to r/o NPH. 4. PT/OT eval and treat 2. A.Fib - continue home meds including xarelto for now 3. CKD - chronic and appears to be slightly elevated from baseline 1. IVF 2. Holding diuretics 3. Recheck BMP in am 4. H/O elevated TSH on labs in 2014 - 1. Not currently on synthroid 2. Checking TSH   DVT prophylaxis: Xarelto Code Status: Full Family Communication: Daughter at bedside Consults called: None Admission status: Admit to obs   Mujtaba Bollig, Heywood IlesJARED M. DO [facility] Pager [phone] from 7PM-7AM  If 7AM-7PM, please contact the day physician for the patient [email or web address] Password [email or web address]  05/21/2016, 11:51 PM

## 2016-05-21 NOTE — ED Triage Notes (Signed)
Per EMS: pt from PCP office with generalized weakness x 2 weeks with some back pain and increased number of bowel and bladder incontinence.; IV 20g R hand

## 2016-05-21 NOTE — Patient Instructions (Addendum)
  Due to the worsening of your symptoms as well as acute change in symptoms today, further evaluation through the emergency room is needed and possible hospital admission.    IF you received an x-ray today, you will receive an invoice from [facility]. Please contact [facility] at [phone] with questions or concerns regarding your invoice.   IF you received labwork today, you will receive an invoice from [facility]. Please contact [facility] at [phone] with questions or concerns regarding your invoice.   Our billing staff will not be able to assist you with questions regarding bills from these companies.  You will be contacted with the lab results as soon as they are available. The fastest way to get your results is to activate your My Chart account. Instructions are located on the last page of this paperwork. If you have not heard from us regarding the results in 2 weeks, please contact this office.

## 2016-05-21 NOTE — ED Notes (Signed)
Pt ambulated per RN request. While ambulating pt stated " I feel really light headed". At this point, this tech returned pt to room. Pt ambulated approx three feet in total. Pt did not seem to be in any pain while ambulating but did hold onto tech and seemed to be unsteady on feet.

## 2016-05-21 NOTE — ED Notes (Signed)
Patient transported to MRI 

## 2016-05-21 NOTE — ED Notes (Signed)
Patient transported to X-ray 

## 2016-05-22 ENCOUNTER — Observation Stay (HOSPITAL_COMMUNITY): Payer: Medicare Other

## 2016-05-22 DIAGNOSIS — I481 Persistent atrial fibrillation: Secondary | ICD-10-CM

## 2016-05-22 DIAGNOSIS — R531 Weakness: Secondary | ICD-10-CM | POA: Diagnosis not present

## 2016-05-22 DIAGNOSIS — G9340 Encephalopathy, unspecified: Secondary | ICD-10-CM

## 2016-05-22 DIAGNOSIS — N183 Chronic kidney disease, stage 3 (moderate): Secondary | ICD-10-CM | POA: Diagnosis not present

## 2016-05-22 DIAGNOSIS — I480 Paroxysmal atrial fibrillation: Secondary | ICD-10-CM | POA: Diagnosis not present

## 2016-05-22 DIAGNOSIS — N3000 Acute cystitis without hematuria: Secondary | ICD-10-CM | POA: Diagnosis not present

## 2016-05-22 LAB — BASIC METABOLIC PANEL
ANION GAP: 13 (ref 5–15)
BUN: 28 mg/dL — ABNORMAL HIGH (ref 6–20)
CHLORIDE: 106 mmol/L (ref 101–111)
CO2: 22 mmol/L (ref 22–32)
CREATININE: 1.41 mg/dL — AB (ref 0.44–1.00)
Calcium: 8.5 mg/dL — ABNORMAL LOW (ref 8.9–10.3)
GFR calc non Af Amer: 32 mL/min — ABNORMAL LOW (ref >60)
GFR, EST AFRICAN AMERICAN: 37 mL/min — AB (ref >60)
Glucose, Bld: 59 mg/dL — ABNORMAL LOW (ref 65–99)
POTASSIUM: 3.6 mmol/L (ref 3.5–5.1)
SODIUM: 141 mmol/L (ref 135–145)

## 2016-05-22 LAB — RPR: RPR: NONREACTIVE

## 2016-05-22 LAB — TSH: TSH: 4.188 u[IU]/mL (ref 0.350–4.500)

## 2016-05-22 LAB — HIV ANTIBODY (ROUTINE TESTING W REFLEX): HIV SCREEN 4TH GENERATION: NONREACTIVE

## 2016-05-22 LAB — VITAMIN B12: VITAMIN B 12: 529 pg/mL (ref 180–914)

## 2016-05-22 MED ORDER — APIXABAN 2.5 MG PO TABS
2.5000 mg | ORAL_TABLET | Freq: Two times a day (BID) | ORAL | Status: DC
  Administered 2016-05-22 – 2016-05-23 (×2): 2.5 mg via ORAL
  Filled 2016-05-22 (×2): qty 1

## 2016-05-22 MED ORDER — ACETAMINOPHEN 500 MG PO TABS
1000.0000 mg | ORAL_TABLET | Freq: Three times a day (TID) | ORAL | Status: DC
  Administered 2016-05-22 – 2016-05-23 (×3): 1000 mg via ORAL
  Filled 2016-05-22 (×3): qty 2

## 2016-05-22 NOTE — Evaluation (Signed)
Occupational Therapy Evaluation Patient Details Name: Melanie Blake MRN: [id number] DOB: 06/03/1924 Today's Date: 05/22/2016    History of Present Illness Melanie Blake is a 82 y.o. female with medical history significant of A.Fib.  Patient presents to the ED with niece who brings her in for ~3 weeks of progressive mental and physical decline at home.  Patient previously has lived alone.  Over past 3 weeks she has had progressive generalized weakness, increasing difficulty with managing herself, increasing confusion, and finally bowel and urinary incontinence.  Patient has also been paranoid recently per niece.  Patient also has low back pain   Clinical Impression   Pt with decline in function and safety with ADLs and ADL mobility with decreased strength, balance and endurance. Pt participated in ADLs standing and seated at sink; pt fatigues easily. Pt would benefit from acute OT services to address impairments to increase level of function and safety     Follow Up Recommendations  SNF;Supervision/Assistance - 24 hour    Equipment Recommendations  Tub/shower seat    Recommendations for Other Services       Precautions / Restrictions Precautions Precautions: Fall Restrictions Weight Bearing Restrictions: No      Mobility Bed Mobility Overal bed mobility: Needs Assistance Bed Mobility: Supine to Sit;Sit to Supine     Supine to sit: Supervision Sit to supine: Supervision      Transfers Overall transfer level: Needs assistance Equipment used: None;1 person hand held assist Transfers: Sit to/from Stand Sit to Stand: Min guard              Balance Overall balance assessment: Needs assistance   Sitting balance-Leahy Scale: Good       Standing balance-Leahy Scale: Poor                              ADL Overall ADL's : Needs assistance/impaired     Grooming: Wash/dry hands;Wash/dry face;Standing;Min guard   Upper Body Bathing: Set  up;Sitting   Lower Body Bathing: Moderate assistance;Sit to/from stand   Upper Body Dressing : Set up;Sitting   Lower Body Dressing: Moderate assistance;Sit to/from stand   Toilet Transfer: Min guard;BSC   Toileting- ArchitectClothing Manipulation and Hygiene: Minimal assistance       Functional mobility during ADLs: Min guard       Vision Vision Assessment?: No apparent visual deficits              Pertinent Vitals/Pain Pain Assessment: 0-10 Pain Score: 3  Pain Location: low back Pain Descriptors / Indicators: Aching;Sore Pain Intervention(s): Monitored during session;RN gave pain meds during session     Hand Dominance Right   Extremity/Trunk Assessment Upper Extremity Assessment Upper Extremity Assessment: Generalized weakness   Lower Extremity Assessment Lower Extremity Assessment: Defer to PT evaluation   Cervical / Trunk Assessment Cervical / Trunk Assessment: Kyphotic   Communication Communication Communication: No difficulties   Cognition Arousal/Alertness: Awake/alert Behavior During Therapy: WFL for tasks assessed/performed Overall Cognitive Status: Within Functional Limits for tasks assessed                     General Comments   Pt very pleasant and cooperative                 Home Living Family/patient expects to be discharged to:: Skilled nursing facility Living Arrangements: Other relatives (niece) Available Help at Discharge: Family;Available PRN/intermittently Type of Home: House Home Access:  Stairs to enter Entergy CorporationEntrance Stairs-Number of Steps: 3 Entrance Stairs-Rails: Right;Left Home Layout: One level     Bathroom Shower/Tub: Producer, television/film/videoWalk-in shower   Bathroom Toilet: Standard     Home Equipment: None          Prior Functioning/Environment Level of Independence: Independent                 OT Problem List: Decreased strength;Decreased activity tolerance;Impaired balance (sitting and/or standing);Pain;Decreased knowledge of  use of DME or AE   OT Treatment/Interventions: Self-care/ADL training;DME and/or AE instruction;Therapeutic activities;Patient/family education    OT Goals(Current goals can be found in the care plan section) Acute Rehab OT Goals Patient Stated Goal: get stronger before I go home OT Goal Formulation: With patient Time For Goal Achievement: 05/29/16 Potential to Achieve Goals: Good ADL Goals Pt Will Perform Grooming: with supervision;with set-up;standing Pt Will Perform Lower Body Bathing: with min assist;sitting/lateral leans;sit to/from stand Pt Will Perform Lower Body Dressing: with min assist;sitting/lateral leans;sit to/from stand Pt Will Transfer to Toilet: with supervision;regular height toilet;ambulating;grab bars Pt Will Perform Toileting - Clothing Manipulation and hygiene: with min guard assist;sit to/from stand Additional ADL Goal #1: Pt will stand x 5 minutes for activity tolerance tasks  OT Frequency: Min 2X/week   Barriers to D/C: Decreased caregiver support                        End of Session Equipment Utilized During Treatment: Gait belt;Other (comment) (BSC)  Activity Tolerance: Patient limited by fatigue Patient left: with call bell/phone within reach   Time: 1041-1116 OT Time Calculation (min): 35 min Charges:  OT General Charges $OT Visit: 1 Procedure OT Evaluation $OT Eval Moderate Complexity: 1 Procedure OT Treatments $Self Care/Home Management : 8-22 mins $Therapeutic Activity: 8-22 mins G-Codes: OT G-codes **NOT FOR INPATIENT CLASS** Functional Assessment Tool Used: clinical judgement Functional Limitation: Self care Self Care Current Status (Z6109(G8987): At least 20 percent but less than 40 percent impaired, limited or restricted Self Care Goal Status (U0454(G8988): At least 1 percent but less than 20 percent impaired, limited or restricted  Galen ManilaSpencer, Annamay Laymon Jeanette 05/22/2016, 1:00 PM

## 2016-05-22 NOTE — Care Management Obs Status (Signed)
MEDICARE OBSERVATION STATUS NOTIFICATION   Patient Details  Name: Melanie Blake MRN: [id number] Date of Birth: 12/25/1923   Medicare Observation Status Notification Given:       Epifanio LeschesCole, Markella Dao Hudson, RN 05/22/2016, 2:21 PM

## 2016-05-22 NOTE — NC FL2 (Signed)
  [place] MEDICAID FL2 LEVEL OF CARE SCREENING TOOL     IDENTIFICATION  Patient Name: Melanie PedroBettie M Cartaya Birthdate: 05/23/1924 Sex: female Admission Date (Current Location): 05/21/2016  [facility] [place]   Facility and Address:  [facility], [address], [place], [place] [address]      Provider Number: [id number]  Attending Physician Name and Address:  Maretta BeesShanker M Ghimire, MD  Relative Name and Phone Number:  Mindi JunkerMarsha, niece, [phone]    Current Level of Care: Hospital Recommended Level of Care: Skilled Nursing Facility Prior Approval Number:    Date Approved/Denied:   PASRR Number: [id number] [id number]  Discharge Plan: SNF    Current Diagnoses: Patient Active Problem List   Diagnosis Date Noted  . UTI (urinary tract infection) 05/21/2016  . Generalized weakness 05/21/2016  . Altered mental status 05/21/2016  . CKD (chronic kidney disease) 11/13/2013  . Atrial fibrillation (HCC) 11/11/2012  . Chronic diastolic CHF (congestive heart failure) (HCC) 11/11/2012  . Atrial fibrillation with RVR (HCC) 10/28/2012  . Acute diastolic CHF (congestive heart failure) (HCC) 10/28/2012  . Elevated TSH 10/28/2012    Orientation RESPIRATION BLADDER Height & Weight     Self, Time, Situation, Place  Normal Continent Weight: 46.5 kg (102 lb 9.6 oz) Height:  5' (152.4 cm)  BEHAVIORAL SYMPTOMS/MOOD NEUROLOGICAL BOWEL NUTRITION STATUS   (N/A)   Continent Diet (Please see DC Summary)  AMBULATORY STATUS COMMUNICATION OF NEEDS Skin   Limited Assist Verbally Normal                       Personal Care Assistance Level of Assistance  Bathing, Feeding, Dressing Bathing Assistance: Limited assistance Feeding assistance: Independent Dressing Assistance: Limited assistance     Functional Limitations Info             SPECIAL CARE FACTORS FREQUENCY  PT (By licensed PT)     PT Frequency: min 5x/week              Contractures       Additional Factors Info  Code Status, Allergies Code Status Info: Full Allergies Info: Penicillins, Sulfa Antibiotics           Current Medications (05/22/2016):  This is the current hospital active medication list Current Facility-Administered Medications  Medication Dose Route Frequency Provider Last Rate Last Dose  . 0.9 %  sodium chloride infusion   Intravenous Continuous Mancel BaleElliott Wentz, MD 125 mL/hr at 05/22/16 0603    . acetaminophen (TYLENOL) tablet 500 mg  500 mg Oral Q6H PRN Hillary BowJared M Gardner, DO      . cefTRIAXone (ROCEPHIN) 1 g in dextrose 5 % 50 mL IVPB  1 g Intravenous QHS Hillary BowJared M Gardner, DO 100 mL/hr at 05/22/16 0056 1 g at 05/22/16 0056  . diltiazem (CARDIZEM CD) 24 hr capsule 120 mg  120 mg Oral Daily Hillary BowJared M Gardner, DO   120 mg at 05/22/16 1053  . metoprolol succinate (TOPROL-XL) 24 hr tablet 50 mg  50 mg Oral Daily Hillary BowJared M Gardner, DO   50 mg at 05/22/16 1052  . Rivaroxaban (XARELTO) tablet 15 mg  15 mg Oral Q supper Hillary BowJared M Gardner, DO         Discharge Medications: Please see discharge summary for a list of discharge medications.  Relevant Imaging Results:  Relevant Lab Results:   Additional Information SSN: [id number]  [address] [place], [place]

## 2016-05-22 NOTE — Evaluation (Signed)
Physical Therapy Evaluation Patient Details Name: Melanie Blake MRN: [id number] DOB: 05/10/1924 Today's Date: 05/22/2016   History of Present Illness  Melanie Blake is a 82 y.o. female with medical history significant of A.Fib.  Patient presents to the ED with niece who brings her in for ~3 weeks of progressive mental and physical decline at home.  Patient previously has lived alone.  Over past 3 weeks she has had progressive generalized weakness, increasing difficulty with managing herself, increasing confusion, and finally bowel and urinary incontinence.  Patient has also been paranoid recently per niece.  Patient also has low back pain  Clinical Impression  Patient presents with generalized weakness, back pain, balance deficits and impaired mobility s/p above. Tolerated gait training with Min A for balance/safety. Would benefit from use of RW for support to decrease fall risk. Pt independent PTA and gets assist for driving from niece. Pt not safe to be home alone. Would benefit from SNF to maximize independence and mobility prior to return home. Will follow acutely.     Follow Up Recommendations SNF    Equipment Recommendations  Other (comment) (defer to next venue)    Recommendations for Other Services OT consult     Precautions / Restrictions Precautions Precautions: Fall Restrictions Weight Bearing Restrictions: No      Mobility  Bed Mobility Overal bed mobility: Needs Assistance Bed Mobility: Supine to Sit     Supine to sit: Supervision Sit to supine: Supervision   General bed mobility comments: Increased time but no assist needed. Use of rail.  Transfers Overall transfer level: Needs assistance Equipment used: 1 person hand held assist Transfers: Sit to/from Stand Sit to Stand: Min guard         General transfer comment: Min guard for safety. + lightheadedness upon standing. Transferred to chair post ambulation bout.  Ambulation/Gait Ambulation/Gait  assistance: Min assist Ambulation Distance (Feet): 120 Feet Assistive device: 1 person hand held assist Gait Pattern/deviations: Step-through pattern;Decreased stride length;Trunk flexed Gait velocity: decreased   General Gait Details: Slow, unsteady gait requiring Min A for balance/safety. Mild DOE. Not able to get Sp02 or HR due to cold fingers.  Stairs            Wheelchair Mobility    Modified Rankin (Stroke Patients Only)       Balance Overall balance assessment: Needs assistance Sitting-balance support: Feet supported;No upper extremity supported Sitting balance-Leahy Scale: Good     Standing balance support: During functional activity Standing balance-Leahy Scale: Fair Standing balance comment: Able to stand without support statically with mild sway, but requires external support for dynamic standing.                             Pertinent Vitals/Pain Pain Assessment: Faces Pain Score: 3  Faces Pain Scale: Hurts a little bit Pain Location: back Pain Descriptors / Indicators: Aching;Sore Pain Intervention(s): Monitored during session;Repositioned    Home Living Family/patient expects to be discharged to:: Skilled nursing facility Living Arrangements: Other relatives Available Help at Discharge: Family;Available PRN/intermittently Type of Home: House Home Access: Stairs to enter Entrance Stairs-Rails: Doctor, general practiceight;Left Entrance Stairs-Number of Steps: 3 Home Layout: One level Home Equipment: None      Prior Function Level of Independence: Independent         Comments: Niece assists with driving to appt, grocery store,. Reports falls.     Hand Dominance   Dominant Hand: Right    Extremity/Trunk Assessment  Upper Extremity Assessment: Defer to OT evaluation           Lower Extremity Assessment: Generalized weakness      Cervical / Trunk Assessment: Kyphotic  Communication   Communication: No difficulties  Cognition  Arousal/Alertness: Awake/alert Behavior During Therapy: WFL for tasks assessed/performed Overall Cognitive Status: Within Functional Limits for tasks assessed                      General Comments      Exercises     Assessment/Plan    PT Assessment Patient needs continued PT services  PT Problem List Decreased strength;Decreased mobility;Decreased balance;Cardiopulmonary status limiting activity;Decreased knowledge of use of DME;Pain          PT Treatment Interventions DME instruction;Therapeutic activities;Gait training;Therapeutic exercise;Patient/family education;Balance training;Functional mobility training;Stair training    PT Goals (Current goals can be found in the Care Plan section)  Acute Rehab PT Goals Patient Stated Goal: get stronger before I go home PT Goal Formulation: With patient Time For Goal Achievement: 06/05/16 Potential to Achieve Goals: Good    Frequency Min 2X/week   Barriers to discharge Decreased caregiver support;Inaccessible home environment lives alone and has stairs to enter home    Co-evaluation               End of Session Equipment Utilized During Treatment: Gait belt Activity Tolerance: Patient tolerated treatment well Patient left: in chair;with call bell/phone within reach;with chair alarm set Nurse Communication: Mobility status    Functional Assessment Tool Used: clinical judgment Functional Limitation: Mobility: Walking and moving around Mobility: Walking and Moving Around Current Status ([id number](G8978): At least 20 percent but less than 40 percent impaired, limited or restricted Mobility: Walking and Moving Around Goal Status [phone]([id number]): At least 1 percent but less than 20 percent impaired, limited or restricted    Time: 1432-1450 PT Time Calculation (min) (ACUTE ONLY): 18 min   Charges:   PT Evaluation $PT Eval Moderate Complexity: 1 Procedure     PT G Codes:   PT G-Codes **NOT FOR INPATIENT CLASS** Functional  Assessment Tool Used: clinical judgment Functional Limitation: Mobility: Walking and moving around Mobility: Walking and Moving Around Current Status ([id number](G8978): At least 20 percent but less than 40 percent impaired, limited or restricted Mobility: Walking and Moving Around Goal Status [phone](G8979): At least 1 percent but less than 20 percent impaired, limited or restricted    Esaw Knippel A Galo Sayed 05/22/2016, 2:56 PM Mylo RedShauna Jassiel Flye, PT, DPT [phone]

## 2016-05-22 NOTE — ED Notes (Signed)
Patient transported to CT 

## 2016-05-22 NOTE — Progress Notes (Addendum)
PROGRESS NOTE        PATIENT DETAILS Name: Melanie Blake Age: 82 y.o. Sex: female Date of Birth: 04/09/1924 Admit Date: 05/21/2016 Admitting Physician Hillary BowJared M Gardner, DO PCP:No PCP Per Patient  Brief Narrative: Patient is a 82 y.o. female with medical history significant of A.Fib, presented to the ED with niece for concern for cauda equina per PCP. She has a history of low back pain and recent fall, and over the past 3 weeks has had progressive generalized weakness, increasing difficulty with managing herself, increasing confusion, bowel incontinence, urinary incontinence, and paranoia. MR negative for cauda equina. Non contrast head CT shows no acute intracranial abnormality. CXR shows mild left basilar scarring versus atelectasis. UA positive for leukocytes. Patient is interacting appropriately today.  Subjective: Patient is doing well today. She states this is the best she has felt in a week.   Assessment/Plan: Generalized weakness with acute encephalopathy: Suspect mild encephalopathy in a setting of UTI-encephalopathy has resolved. Patient presented to ED for concern of cauda equina per PCP. MRI neg for cauda equina. Lumbar spine XR negative for acute fracture or spondylolisthesis. CT head shows no acute findings, probable right posterior lateral frontal 7 mm meningioma- defer to outpatient follow-up with PCP. Does have mild UTI, started on rocephin. She is completely awake and alert this morning and claims that her back pain is significantly better. Will plan on SNF on discharge.  Urinary tract infection: Positive UA, started on rocephin. Urine culture pending.   A.Fib: Given CKD stage III-we will switch to Eliquis.Contine rate control with Metoprolol and Cardizem. Chads2vasc of atleast 4  CKD stage III: chronic and appears to be close to her baseline. Will stop IVF, resume diuretics  H/O elevated TSH on labs in 2014: Not currently on synthroid. TSH 4.188  on 11/1-no further eval or treatment required  DVT Prophylaxis: Full dose anticoagulation with Eliquis  Code Status: Full code   Family Communication: Spoke with niece over phone  Disposition Plan: Remain inpatient-but will plan on SNF on discharge likely 11/3  Antimicrobial agents: See below.  Procedures: None  CONSULTS: None  Time spent: 30 minutes-Greater than 50% of this time was spent in counseling, explanation of diagnosis, planning of further management, and coordination of care.  MEDICATIONS: Anti-infectives    Start     Dose/Rate Route Frequency Ordered Stop   05/22/16 1000  cephALEXin (KEFLEX) capsule 500 mg  Status:  Discontinued     500 mg Oral Every 12 hours 05/21/16 2338 05/21/16 2350   05/22/16 0015  cefTRIAXone (ROCEPHIN) 1 g in dextrose 5 % 50 mL IVPB     1 g 100 mL/hr over 30 Minutes Intravenous Daily at bedtime 05/21/16 2350     05/21/16 2200  cephALEXin (KEFLEX) capsule 250 mg     250 mg Oral  Once 05/21/16 2154 05/21/16 2313      Scheduled Meds: . cefTRIAXone (ROCEPHIN)  IV  1 g Intravenous QHS  . diltiazem  120 mg Oral Daily  . metoprolol succinate  50 mg Oral Daily  . Rivaroxaban  15 mg Oral Q supper   Continuous Infusions: . sodium chloride 125 mL/hr at 05/22/16 0603   PRN Meds:.acetaminophen   PHYSICAL EXAM: Vital signs: Vitals:   05/21/16 2303 05/22/16 0123 05/22/16 0127 05/22/16 0509  BP: 119/59  (!) 114/55 (!) 101/47  Pulse: 104  92 88  Resp: 22  16 16   Temp:   98.3 F (36.8 C) 98.3 F (36.8 C)  TempSrc:   Oral Oral  SpO2: 96%  100% 99%  Weight:  46.5 kg (102 lb 9.6 oz)    Height:  5' (1.524 m)     Filed Weights   05/22/16 0123  Weight: 46.5 kg (102 lb 9.6 oz)   Body mass index is 20.04 kg/m.   General appearance :Awake, alert, not in any distress. Speech Clear. Not toxic Looking Eyes: no scleral icterus.Pink conjunctiva HEENT: Atraumatic and Normocephalic Resp:Good air entry bilaterally, no added sounds  CVS: S1  S2 regular, no murmurs.  GI: Bowel sounds present, Non tender and not distended with no gaurding, rigidity or rebound.No organomegaly Extremities: B/L Lower Ext shows no edema, both legs are warm to touch. Normal ROM of arms and legs bilaterally. Neurology:  speech clear Psychiatric: Normal judgment and insight. Alert and oriented x 3. Normal mood. Musculoskeletal: No digital cyanosis. 5/5 strength of lower extremities.   I have personally reviewed following labs and imaging studies  LABORATORY DATA: CBC:  Recent Labs Lab 05/21/16 1715  WBC 7.1  NEUTROABS 5.5  HGB 12.0  HCT 36.5  MCV 91.3  PLT 348    Basic Metabolic Panel:  Recent Labs Lab 05/21/16 1715 05/22/16 0739  NA 137 141  K 4.0 3.6  CL 99* 106  CO2 26 22  GLUCOSE 87 59*  BUN 32* 28*  CREATININE 1.74* 1.41*  CALCIUM 8.9 8.5*    GFR: Estimated Creatinine Clearance: 19 mL/min (by C-G formula based on SCr of 1.41 mg/dL (H)).  Liver Function Tests:  Recent Labs Lab 05/21/16 1715  AST 18  ALT 17  ALKPHOS 63  BILITOT 1.2  PROT 6.2*  ALBUMIN 3.5   No results for input(s): LIPASE, AMYLASE in the last 168 hours.  Recent Labs Lab 05/21/16 1715  AMMONIA 10    Coagulation Profile: No results for input(s): INR, PROTIME in the last 168 hours.  Cardiac Enzymes: No results for input(s): CKTOTAL, CKMB, CKMBINDEX, TROPONINI in the last 168 hours.  BNP (last 3 results) No results for input(s): PROBNP in the last 8760 hours.  HbA1C: No results for input(s): HGBA1C in the last 72 hours.  CBG: No results for input(s): GLUCAP in the last 168 hours.  Lipid Profile: No results for input(s): CHOL, HDL, LDLCALC, TRIG, CHOLHDL, LDLDIRECT in the last 72 hours.  Thyroid Function Tests:  Recent Labs  05/21/16 2355  TSH 4.188    Anemia Panel:  Recent Labs  05/22/16 0739  VITAMINB12 529    Urine analysis:    Component Value Date/Time   COLORURINE YELLOW 05/21/2016 2118   APPEARANCEUR CLOUDY  (A) 05/21/2016 2118   LABSPEC 1.013 05/21/2016 2118   PHURINE 5.5 05/21/2016 2118   GLUCOSEU NEGATIVE 05/21/2016 2118   HGBUR LARGE (A) 05/21/2016 2118   BILIRUBINUR NEGATIVE 05/21/2016 2118   KETONESUR 15 (A) 05/21/2016 2118   PROTEINUR NEGATIVE 05/21/2016 2118   UROBILINOGEN 0.2 08/17/2010 1145   NITRITE NEGATIVE 05/21/2016 2118   LEUKOCYTESUR MODERATE (A) 05/21/2016 2118    Sepsis Labs: Lactic Acid, Venous No results found for: LATICACIDVEN  MICROBIOLOGY: No results found for this or any previous visit (from the past 240 hour(s)).  RADIOLOGY STUDIES/RESULTS: Dg Chest 2 View  Result Date: 05/21/2016 CLINICAL DATA:  Cough, shortness of breath, and weakness beginning yesterday. EXAM: CHEST  2 VIEW COMPARISON:  10/27/2012 FINDINGS: Cardiomegaly and ectasia thoracic aorta show  no significant change. Aortic atherosclerosis. Linear opacity is seen in the left lung base, which may be due to scarring or atelectasis. No evidence of pulmonary consolidation or edema. No evidence of pneumothorax or pleural effusion. Thoracic kyphosis noted, with lower thoracic vertebral body compression fracture deformity. IMPRESSION: Mild left basilar scarring versus atelectasis. No evidence of pulmonary airspace disease or pleural effusion. Lower thoracic vertebral body compression fracture deformity, of indeterminate age. Stable cardiomegaly.  Aortic atherosclerosis. Electronically Signed   By: Myles Rosenthal M.D.   On: 05/21/2016 16:54   Dg Lumbar Spine Complete  Result Date: 05/21/2016 CLINICAL DATA:  Lumbago EXAM: LUMBAR SPINE - COMPLETE 4+ VIEW COMPARISON:  August 17, 2010 FINDINGS: Frontal, lateral, spot lumbosacral lateral, and bilateral oblique views were obtained. There are 5 non-rib-bearing lumbar type vertebral bodies. There is lumbar levoscoliosis. There is no acute fracture or spondylolisthesis. There is moderate disc space narrowing at L1-2 with vacuum phenomenon. There is moderate disc space narrowing  at all other levels, essentially stable. There is facet osteoarthritic change at all levels bilaterally. There is atherosclerotic calcification in the aorta. IMPRESSION: Persistent multilevel arthropathy. Scoliosis. No acute fracture or spondylolisthesis. There is aortic atherosclerosis. Electronically Signed   By: Bretta Bang III M.D.   On: 05/21/2016 16:54   Ct Head Wo Contrast  Result Date: 05/22/2016 CLINICAL DATA:  82 y/o F; 2 weeks of generalized weakness with onset of altered mental status. EXAM: CT HEAD WITHOUT CONTRAST TECHNIQUE: Contiguous axial images were obtained from the base of the skull through the vertex without intravenous contrast. COMPARISON:  None. FINDINGS: Brain: No evidence for large acute infarct, focal mass effect, or intracranial hemorrhage. Chronic lacunar infarct in left cerebellar hemisphere and bilateral putamen. Mild chronic microvascular ischemic changes and parenchymal volume loss for age. No hydrocephalus or extra-axial collection. Right posterior lateral frontal dural calcified lesion measuring 7 mm is probably a meningioma. Vascular: No hyperdense vessel. Calcific atherosclerosis of cavernous internal carotid arteries and vertebral arteries. Skull: No displaced calvarial fracture. Small nonspecific lucencies in the calvarium probably a arachnoid granulations. Sinuses/Orbits: Visualized paranasal sinuses and mastoid air cells are normally aerated. Bilateral intra-ocular lens replacement. Other: None. IMPRESSION: 1. No acute intracranial abnormality identified. 2. Mild chronic microvascular ischemic changes and parenchymal volume loss for age. Small basal ganglia and left cerebellar chronic lacunar infarcts. 3. Probable right posterior lateral frontal 7 mm meningioma. Electronically Signed   By: Mitzi Hansen M.D.   On: 05/22/2016 01:12   Mr Lumbar Spine Wo Contrast  Result Date: 05/21/2016 CLINICAL DATA:  Generalized weakness for 2 weeks.  Low back pain.  EXAM: MRI LUMBAR SPINE WITHOUT CONTRAST TECHNIQUE: Multiplanar, multisequence MR imaging of the lumbar spine was performed. No intravenous contrast was administered. COMPARISON:  Lumbar spine radiograph 05/21/2016 FINDINGS: Segmentation: Normal. The lowest disc space is considered to be L5-S1. Alignment: Possible trace retrolisthesis at L2-L3. There is left convex lumbar scoliosis with apex at the L2-L3 level. Vertebrae: No acute compression fracture, facet edema or focal marrow lesion. Conus medullaris: Extends to the L2 level and appears normal. Paraspinal and other soft tissues: The visualized aorta, IVC and iliac vessels are normal. The visualized retroperitoneal organs and paraspinal soft tissues are normal. Disc levels: T12-L1: Minimal disc herniation. No spinal canal or neural foraminal stenosis. L1-L2: Narrowing of the intervertebral disc space without significant bulge. No spinal canal stenosis. Mild right neuroforaminal stenosis. L2-L3: Narrowing of the intervertebral disc space with endplate osteophytosis, greater on the right. No significant disc bulge. Normal facets.  No spinal canal stenosis. Moderate right neuroforaminal stenosis. L3-L4: Normal disc space and facet joints. No spinal canal stenosis. No neuroforaminal stenosis. L4-L5: There is moderate left facet hypertrophy. Small disc bulge. No spinal canal stenosis. Moderate to severe left neuroforaminal stenosis. L5-S1: Minimal disc bulge. Mild facet hypertrophy. No spinal canal stenosis. No neuroforaminal stenosis. IMPRESSION: 1. Moderate to severe left neural foraminal stenosis at L4-L5, primarily due to left facet overgrowth. 2. Multilevel degenerative disc dizziness without spinal canal stenosis. 3. Moderate right L2-L3 foraminal stenosis due to combination of scoliosis and endplate osteophytosis. Electronically Signed   By: Deatra Robinson M.D.   On: 05/21/2016 21:17   Ct Renal Stone Study  Result Date: 05/11/2016 CLINICAL DATA:  Left flank  and severe low back pain for 2 weeks. History of kidney stones. EXAM: CT ABDOMEN AND PELVIS WITHOUT CONTRAST TECHNIQUE: Multidetector CT imaging of the abdomen and pelvis was performed following the standard protocol without IV contrast. COMPARISON:  Lumbar spine radiographs 08/09/2010. Report from CT 03/18/2001. FINDINGS: Lower chest: Moderate to large hiatal hernia is incompletely visualized. There is linear atelectasis or scarring at both lung bases. No significant pleural or pericardial effusion. There is atherosclerosis of the thoracic aorta. Hepatobiliary: There is a 16 mm low-density cyst in the dome of the right hepatic lobe on image 7. There is additional low density medially in the left lobe on image 16. No suspicious hepatic findings on noncontrast imaging. The gallbladder is incompletely distended. No evidence of calcified gallstones, surrounding inflammatory change or biliary dilatation. Pancreas: Unremarkable. No pancreatic ductal dilatation or surrounding inflammatory changes. Spleen: Normal in size without focal abnormality. Adrenals/Urinary Tract: Mild low-density prominence of both adrenal glands, likely due to hyperplasia. No suspicious adrenal findings. Both kidneys demonstrate mild cortical thinning. There is a 5 mm calcification in the upper pole the right kidney which may reflect a nonobstructing calculus. No other urinary tract calculi are seen. There is no evidence of hydronephrosis or perinephric soft tissue stranding. The bladder appears unremarkable. Stomach/Bowel: No evidence of bowel wall thickening, distention or surrounding inflammatory change. The cecum appears mobile. The appendix appears normal. Mildly prominent stool throughout the colon. There are mild diverticular changes of the sigmoid colon. Vascular/Lymphatic: There are no enlarged abdominal or pelvic lymph nodes. Diffuse aortic and branch vessel atherosclerosis. Reproductive: The uterus and adnexa appear unremarkable for  age. No evidence of adnexal mass or pelvic inflammatory process. Other: No evidence of abdominal wall mass or hernia. No ascites. Musculoskeletal: No acute or significant osseous findings. There are degenerative changes throughout the lumbar spine associated with a convex left scoliosis. IMPRESSION: 1. No acute findings or explanation for the patient's symptoms. 2. No evidence of hydronephrosis or left-sided urinary tract calculus. Possible small nonobstructing calculus in the upper pole of the right kidney. 3. Moderate to large hiatal hernia. 4. Aortic and branch vessel atherosclerosis. Electronically Signed   By: Carey Bullocks M.D.   On: 05/11/2016 19:38     LOS: 0 days   Randa Lynn, PA-S  [facility] Pager:[phone]  If 7PM-7AM, please contact night-coverage [email or web address] Password [facility] 05/22/2016, 12:44 PM  Attending MD note  Patient was seen, examined,treatment plan was discussed with the PA-S.  I have personally reviewed the clinical findings, lab, imaging studies and management of this patient in detail. I agree with the documentation, as recorded by the PA-S.   Patient is much more awake and alert today, claims this is the best she's been in over a week. Hardly any back  pain today.  On Exam: Gen. exam: Awake, alert, not in any distress Chest: Good air entry bilaterally, no rhonchi or rales CVS: S1-S2 regular, no murmurs Abdomen: Soft, nontender and nondistended Neurology: Non-focal Skin: No rash or lesions  Plan Continue supportive care PT/OT eval Continue Rocephin-Await urine cultures SNF on discharge    Rest as above  [facility] Surgery Center LLCGHIMIRE,Reida Hem [facility]

## 2016-05-22 NOTE — Clinical Social Work Note (Signed)
Clinical Social Work Assessment  Patient Details  Name: Melanie Blake MRN: [id number] Date of Birth: [birth year]  Date of referral:  05/22/16               Reason for consult:  Facility Placement                Permission sought to share information with:  Facility Sport and exercise psychologist, Family Supports Permission granted to share information::  Yes, Verbal Permission Granted  Name::     Oncologist::  SNFs  Relationship::  Niece  Contact Information:  [phone]  Housing/Transportation Living arrangements for the past 2 months:  Apartment Source of Information:  Patient, Other (Comment Required) (Niece) Patient Interpreter Needed:  None Criminal Activity/Legal Involvement Pertinent to Current Situation/Hospitalization:  No - Comment as needed Significant Relationships:  Other Family Members Lives with:  Self Do you feel safe going back to the place where you live?  No Need for family participation in patient care:  Yes (Comment)  Care giving concerns:  CSW received consult for possible SNF placement at time of discharge. CSW met with patient and spoke with patient's niece, Rosann Auerbach, regarding recommendation of SNF placement at time of discharge. Per patient's niece, patient lives alone and is at risk for falling given patient's current physical needs and fall risk. Patient stated that she wants to be independent as long as she can, and would like Rosann Auerbach to help her make decisions. Patient and patient's niece expressed understanding of recommendation and are agreeable to SNF placement at time of discharge. CSW to continue to follow and assist with discharge planning needs.   Social Worker assessment / plan:  CSW spoke with patient and patient's niece concerning possibility of rehab at [facility] before returning home.  Employment status:  Retired Nurse, adult PT Recommendations:  [place] / Referral to community resources:   [place]  Patient/Family's Response to care:  Patient and patient's niece recognize need for rehab before returning home and are agreeable to a SNF in [place]. Patient reported preference for [facility] since her niece knows friends that are there.   Patient/Family's Understanding of and Emotional Response to Diagnosis, Current Treatment, and Prognosis:  Patient/family is realistic regarding therapy needs and expressed being hopeful for SNF placement, but wants to go home after rehab. Patient expressed understanding of CSW role and discharge process. No questions/concerns about plan or treatment.    Emotional Assessment Appearance:  Appears stated age Attitude/Demeanor/Rapport:  Other (Appropriate) Affect (typically observed):  Accepting, Appropriate, Pleasant Orientation:  Oriented to Self, Oriented to Place, Oriented to  Time, Oriented to Situation Alcohol / Substance use:  Not Applicable Psych involvement (Current and /or in the community):  No (Comment)  Discharge Needs  Concerns to be addressed:  Care Coordination Readmission within the last 30 days:  No Current discharge risk:  Lives alone Barriers to Discharge:  Continued Medical Work up   [employer], [place] 05/22/2016, 11:14 AM

## 2016-05-23 DIAGNOSIS — N183 Chronic kidney disease, stage 3 (moderate): Secondary | ICD-10-CM | POA: Diagnosis not present

## 2016-05-23 DIAGNOSIS — N3000 Acute cystitis without hematuria: Secondary | ICD-10-CM | POA: Diagnosis not present

## 2016-05-23 DIAGNOSIS — I480 Paroxysmal atrial fibrillation: Secondary | ICD-10-CM | POA: Diagnosis not present

## 2016-05-23 DIAGNOSIS — R531 Weakness: Secondary | ICD-10-CM | POA: Diagnosis not present

## 2016-05-23 MED ORDER — APIXABAN 2.5 MG PO TABS: 2.5000 mg | ORAL_TABLET | Freq: Two times a day (BID) | ORAL | Status: DC

## 2016-05-23 MED ORDER — ACETAMINOPHEN 500 MG PO TABS: 1000.0000 mg | ORAL_TABLET | Freq: Three times a day (TID) | ORAL | 0 refills | Status: AC | PRN

## 2016-05-23 MED ORDER — DEXTROSE 5 % IV SOLN
1.0000 g | Freq: Once | INTRAVENOUS | Status: AC
  Administered 2016-05-23: 1 g via INTRAVENOUS
  Filled 2016-05-23: qty 10

## 2016-05-23 NOTE — Clinical Social Work Placement (Signed)
   CLINICAL SOCIAL WORK PLACEMENT  NOTE  Date:  05/23/2016  Patient Details  Name: Melanie Blake MRN: [id number] Date of Birth: 11/03/1923  Clinical Social Work is seeking post-discharge placement for this patient at the Skilled  Nursing Facility level of care (*CSW will initial, date and re-position this form in  chart as items are completed):  Yes   Patient/family provided with [place] Clinical Social Work Department's list of facilities offering this level of care within the geographic area requested by the patient (or if unable, by the patient's family).  Yes   Patient/family informed of their freedom to choose among providers that offer the needed level of care, that participate in Medicare, Medicaid or managed care program needed by the patient, have an available bed and are willing to accept the patient.  Yes   Patient/family informed of [facility]'s ownership interest in [facility] and [facility], as well as of the fact that they are under no obligation to receive care at these facilities.  PASRR submitted to EDS on 05/22/16     PASRR number received on 05/22/16     Existing PASRR number confirmed on       FL2 transmitted to all facilities in geographic area requested by pt/family on 05/22/16     FL2 transmitted to all facilities within larger geographic area on       Patient informed that his/her managed care company has contracts with or will negotiate with certain facilities, including the following:        Yes   Patient/family informed of bed offers received.  [facility]     Physician recommends and patient chooses bed at      [facility] 05/23/16.  Patient to be transferred to facility by ptar     Patient family notified on 05/23/16 of transfer.  Name of family member notified:  Melanie Blake     PHYSICIAN Please sign FL2     Additional Comment:     _______________________________________________ Melanie Blake, Melanie Tallie H, LCSW 05/23/2016, 1:40 PM

## 2016-05-23 NOTE — Discharge Summary (Signed)
PATIENT DETAILS Name: Melanie Blake Age: 82 y.o. Sex: female Date of Birth: 1923/10/14 MRN: [id number]. Admitting Physician: Hillary Bow, DO PCP:No PCP Per Patient  Admit Date: 05/21/2016 Discharge date: 05/23/2016  Recommendations for Outpatient Follow-up:  1. Follow up with PCP in 1-2 weeks 2. Please obtain BMP/CBC in one week 3. Follow urine culture results till final  Admitted From:  Home  Disposition: SNF    Home Health: No  Equipment/Devices: None  Discharge Condition: Stable  CODE STATUS: FULL CODE  Diet recommendation:  Heart Healthy   Brief Summary: See H&P, Labs, Consult and Test reports for all details in brief, Patient is a 82 y.o. female with medical history significant of A.Fib on anticoagulation with recent mechanical fall and subsequent worsening back pain-presented to the hospital for worsening weakness, confusion.   Brief Hospital Course: Generalized weakness with acute encephalopathy: Suspect mild encephalopathy in a setting of UTI-encephalopathy has resolved. Patient presented to ED for concern of cauda equina per PCP. MRI neg for cauda equina. Lumbar spine XR negative for acute fracture or spondylolisthesis. CT head shows no acute findings, probable right posterior lateral frontal 7 mm meningioma- defer to outpatient follow-up with PCP.  She is completely awake and alert this morning and claims that her back pain is significantly better. Will plan on SNF on discharge.She may have mild cognitive dysfunction at baseline-may need Neuro referral-but can be done in the outpatient setting.   Urinary tract infection: Positive UA with mild dysuria-has completed 3 days of Rocephin. Urine cx pending at time of discharge.    A.Fib: Given CKD stage III-we will switch to Eliquis.Contine rate control with Metoprolol and Cardizem. Chads2vasc of atleast 4. Spoke with patient and her niece-at this time they want to continue with anticoagulation-if she were  to fall more frequently in the near future-both are agreeable to discontinue anticoagulation.Niece is aware of risk of catastrophic fall.  CKD stage III: chronic and appears to be close to her baseline. Will stop IVF, resume diuretics  H/O elevated TSH on labs in 2014: Not currently on synthroid. TSH 4.188 on 11/1-no further eval or treatment required  Procedures/Studies: None  Discharge Diagnoses:  Principal Problem:   Generalized weakness Active Problems:   Atrial fibrillation (HCC)   CKD (chronic kidney disease)   UTI (urinary tract infection)   Altered mental status   Discharge Instructions:  Activity:  As tolerated with Full fall precautions use walker/cane & assistance as needed  Discharge Instructions    Call MD for:  extreme fatigue    Complete by:  As directed    Diet - low sodium heart healthy    Complete by:  As directed    Increase activity slowly    Complete by:  As directed        Medication List    STOP taking these medications   Rivaroxaban 15 MG Tabs tablet Commonly known as:  XARELTO     TAKE these medications   acetaminophen 500 MG tablet Commonly known as:  TYLENOL Take 2 tablets (1,000 mg total) by mouth every 8 (eight) hours as needed. What changed:  how much to take  when to take this  reasons to take this   apixaban 2.5 MG Tabs tablet Commonly known as:  ELIQUIS Take 1 tablet (2.5 mg total) by mouth 2 (two) times daily.   diltiazem 120 MG 24 hr capsule Commonly known as:  CARDIZEM CD Take 1 capsule (120 mg total) by mouth daily.  furosemide 40 MG tablet Commonly known as:  LASIX TAKE 1 TABLET BY MOUTH EVERY MORNING AND 1/2 TABLET EVERY AFTERNOON AS DIRECTED (WILL PAY 3/1) What changed:  how much to take  how to take this  when to take this  additional instructions   metoprolol succinate 50 MG 24 hr tablet Commonly known as:  TOPROL-XL TAKE 1 TABLET (50 MG TOTAL) BY MOUTH DAILY. TAKE WITH OR IMMEDIATELY FOLLOWING A  MEAL.   potassium chloride 10 MEQ tablet Commonly known as:  KLOR-CON M10 Take 1 tablet (10 mEq total) by mouth daily.       Contact information for follow-up providers    Primary MD. Schedule an appointment as soon as possible for a visit in 2 week(s).            Contact information for after-discharge care    Destination    [facility] .   Specialty:  Skilled Nursing Facility Contact information: [address] [place] [address] [phone]                 Allergies  Allergen Reactions  . Penicillins Swelling  . Sulfa Antibiotics Hives and Swelling    Consultations:   None  Other Procedures/Studies: Dg Chest 2 View  Result Date: 05/21/2016 CLINICAL DATA:  Cough, shortness of breath, and weakness beginning yesterday. EXAM: CHEST  2 VIEW COMPARISON:  10/27/2012 FINDINGS: Cardiomegaly and ectasia thoracic aorta show no significant change. Aortic atherosclerosis. Linear opacity is seen in the left lung base, which may be due to scarring or atelectasis. No evidence of pulmonary consolidation or edema. No evidence of pneumothorax or pleural effusion. Thoracic kyphosis noted, with lower thoracic vertebral body compression fracture deformity. IMPRESSION: Mild left basilar scarring versus atelectasis. No evidence of pulmonary airspace disease or pleural effusion. Lower thoracic vertebral body compression fracture deformity, of indeterminate age. Stable cardiomegaly.  Aortic atherosclerosis. Electronically Signed   By: Myles Rosenthal M.D.   On: 05/21/2016 16:54   Dg Lumbar Spine Complete  Result Date: 05/21/2016 CLINICAL DATA:  Lumbago EXAM: LUMBAR SPINE - COMPLETE 4+ VIEW COMPARISON:  August 17, 2010 FINDINGS: Frontal, lateral, spot lumbosacral lateral, and bilateral oblique views were obtained. There are 5 non-rib-bearing lumbar type vertebral bodies. There is lumbar levoscoliosis. There is no acute fracture or spondylolisthesis.  There is moderate disc space narrowing at L1-2 with vacuum phenomenon. There is moderate disc space narrowing at all other levels, essentially stable. There is facet osteoarthritic change at all levels bilaterally. There is atherosclerotic calcification in the aorta. IMPRESSION: Persistent multilevel arthropathy. Scoliosis. No acute fracture or spondylolisthesis. There is aortic atherosclerosis. Electronically Signed   By: Bretta Bang III M.D.   On: 05/21/2016 16:54   Ct Head Wo Contrast  Result Date: 05/22/2016 CLINICAL DATA:  82 y/o F; 2 weeks of generalized weakness with onset of altered mental status. EXAM: CT HEAD WITHOUT CONTRAST TECHNIQUE: Contiguous axial images were obtained from the base of the skull through the vertex without intravenous contrast. COMPARISON:  None. FINDINGS: Brain: No evidence for large acute infarct, focal mass effect, or intracranial hemorrhage. Chronic lacunar infarct in left cerebellar hemisphere and bilateral putamen. Mild chronic microvascular ischemic changes and parenchymal volume loss for age. No hydrocephalus or extra-axial collection. Right posterior lateral frontal dural calcified lesion measuring 7 mm is probably a meningioma. Vascular: No hyperdense vessel. Calcific atherosclerosis of cavernous internal carotid arteries and vertebral arteries. Skull: No displaced calvarial fracture. Small nonspecific lucencies in the calvarium probably a  arachnoid granulations. Sinuses/Orbits: Visualized paranasal sinuses and mastoid air cells are normally aerated. Bilateral intra-ocular lens replacement. Other: None. IMPRESSION: 1. No acute intracranial abnormality identified. 2. Mild chronic microvascular ischemic changes and parenchymal volume loss for age. Small basal ganglia and left cerebellar chronic lacunar infarcts. 3. Probable right posterior lateral frontal 7 mm meningioma. Electronically Signed   By: Mitzi HansenLance  Furusawa-Stratton M.D.   On: 05/22/2016 01:12   Mr Lumbar  Spine Wo Contrast  Result Date: 05/21/2016 CLINICAL DATA:  Generalized weakness for 2 weeks.  Low back pain. EXAM: MRI LUMBAR SPINE WITHOUT CONTRAST TECHNIQUE: Multiplanar, multisequence MR imaging of the lumbar spine was performed. No intravenous contrast was administered. COMPARISON:  Lumbar spine radiograph 05/21/2016 FINDINGS: Segmentation: Normal. The lowest disc space is considered to be L5-S1. Alignment: Possible trace retrolisthesis at L2-L3. There is left convex lumbar scoliosis with apex at the L2-L3 level. Vertebrae: No acute compression fracture, facet edema or focal marrow lesion. Conus medullaris: Extends to the L2 level and appears normal. Paraspinal and other soft tissues: The visualized aorta, IVC and iliac vessels are normal. The visualized retroperitoneal organs and paraspinal soft tissues are normal. Disc levels: T12-L1: Minimal disc herniation. No spinal canal or neural foraminal stenosis. L1-L2: Narrowing of the intervertebral disc space without significant bulge. No spinal canal stenosis. Mild right neuroforaminal stenosis. L2-L3: Narrowing of the intervertebral disc space with endplate osteophytosis, greater on the right. No significant disc bulge. Normal facets. No spinal canal stenosis. Moderate right neuroforaminal stenosis. L3-L4: Normal disc space and facet joints. No spinal canal stenosis. No neuroforaminal stenosis. L4-L5: There is moderate left facet hypertrophy. Small disc bulge. No spinal canal stenosis. Moderate to severe left neuroforaminal stenosis. L5-S1: Minimal disc bulge. Mild facet hypertrophy. No spinal canal stenosis. No neuroforaminal stenosis. IMPRESSION: 1. Moderate to severe left neural foraminal stenosis at L4-L5, primarily due to left facet overgrowth. 2. Multilevel degenerative disc dizziness without spinal canal stenosis. 3. Moderate right L2-L3 foraminal stenosis due to combination of scoliosis and endplate osteophytosis. Electronically Signed   By: Deatra RobinsonKevin   Herman M.D.   On: 05/21/2016 21:17   Ct Renal Stone Study  Result Date: 05/11/2016 CLINICAL DATA:  Left flank and severe low back pain for 2 weeks. History of kidney stones. EXAM: CT ABDOMEN AND PELVIS WITHOUT CONTRAST TECHNIQUE: Multidetector CT imaging of the abdomen and pelvis was performed following the standard protocol without IV contrast. COMPARISON:  Lumbar spine radiographs 08/09/2010. Report from CT 03/18/2001. FINDINGS: Lower chest: Moderate to large hiatal hernia is incompletely visualized. There is linear atelectasis or scarring at both lung bases. No significant pleural or pericardial effusion. There is atherosclerosis of the thoracic aorta. Hepatobiliary: There is a 16 mm low-density cyst in the dome of the right hepatic lobe on image 7. There is additional low density medially in the left lobe on image 16. No suspicious hepatic findings on noncontrast imaging. The gallbladder is incompletely distended. No evidence of calcified gallstones, surrounding inflammatory change or biliary dilatation. Pancreas: Unremarkable. No pancreatic ductal dilatation or surrounding inflammatory changes. Spleen: Normal in size without focal abnormality. Adrenals/Urinary Tract: Mild low-density prominence of both adrenal glands, likely due to hyperplasia. No suspicious adrenal findings. Both kidneys demonstrate mild cortical thinning. There is a 5 mm calcification in the upper pole the right kidney which may reflect a nonobstructing calculus. No other urinary tract calculi are seen. There is no evidence of hydronephrosis or perinephric soft tissue stranding. The bladder appears unremarkable. Stomach/Bowel: No evidence of bowel wall thickening, distention or  surrounding inflammatory change. The cecum appears mobile. The appendix appears normal. Mildly prominent stool throughout the colon. There are mild diverticular changes of the sigmoid colon. Vascular/Lymphatic: There are no enlarged abdominal or pelvic lymph  nodes. Diffuse aortic and branch vessel atherosclerosis. Reproductive: The uterus and adnexa appear unremarkable for age. No evidence of adnexal mass or pelvic inflammatory process. Other: No evidence of abdominal wall mass or hernia. No ascites. Musculoskeletal: No acute or significant osseous findings. There are degenerative changes throughout the lumbar spine associated with a convex left scoliosis. IMPRESSION: 1. No acute findings or explanation for the patient's symptoms. 2. No evidence of hydronephrosis or left-sided urinary tract calculus. Possible small nonobstructing calculus in the upper pole of the right kidney. 3. Moderate to large hiatal hernia. 4. Aortic and branch vessel atherosclerosis. Electronically Signed   By: Carey Bullocks M.D.   On: 05/11/2016 19:38     TODAY-DAY OF DISCHARGE:  Subjective:   Melanie Blake today has no headache,no chest abdominal pain,no new weakness tingling or numbness, feels much better wants to go home today.   Objective:   Blood pressure 109/72, pulse 60, temperature 98.3 F (36.8 C), temperature source Oral, resp. rate 18, height 5' (1.524 m), weight 46.5 kg (102 lb 9.6 oz), SpO2 96 %.  Intake/Output Summary (Last 24 hours) at 05/23/16 1154 Last data filed at 05/23/16 0956  Gross per 24 hour  Intake              590 ml  Output                0 ml  Net              590 ml   Filed Weights   05/22/16 0123  Weight: 46.5 kg (102 lb 9.6 oz)    Exam: Awake Alert, Oriented *3, No new F.N deficits, Normal affect [place].AT,PERRAL Supple Neck,No JVD, No cervical lymphadenopathy appriciated.  Symmetrical Chest wall movement, Good air movement bilaterally, CTAB RRR,No Gallops,Rubs or new Murmurs, No Parasternal Heave +ve B.Sounds, Abd Soft, Non tender, No organomegaly appriciated, No rebound -guarding or rigidity. No Cyanosis, Clubbing or edema, No new Rash or bruise   PERTINENT RADIOLOGIC STUDIES: Dg Chest 2 View  Result Date:  05/21/2016 CLINICAL DATA:  Cough, shortness of breath, and weakness beginning yesterday. EXAM: CHEST  2 VIEW COMPARISON:  10/27/2012 FINDINGS: Cardiomegaly and ectasia thoracic aorta show no significant change. Aortic atherosclerosis. Linear opacity is seen in the left lung base, which may be due to scarring or atelectasis. No evidence of pulmonary consolidation or edema. No evidence of pneumothorax or pleural effusion. Thoracic kyphosis noted, with lower thoracic vertebral body compression fracture deformity. IMPRESSION: Mild left basilar scarring versus atelectasis. No evidence of pulmonary airspace disease or pleural effusion. Lower thoracic vertebral body compression fracture deformity, of indeterminate age. Stable cardiomegaly.  Aortic atherosclerosis. Electronically Signed   By: Myles Rosenthal M.D.   On: 05/21/2016 16:54   Dg Lumbar Spine Complete  Result Date: 05/21/2016 CLINICAL DATA:  Lumbago EXAM: LUMBAR SPINE - COMPLETE 4+ VIEW COMPARISON:  August 17, 2010 FINDINGS: Frontal, lateral, spot lumbosacral lateral, and bilateral oblique views were obtained. There are 5 non-rib-bearing lumbar type vertebral bodies. There is lumbar levoscoliosis. There is no acute fracture or spondylolisthesis. There is moderate disc space narrowing at L1-2 with vacuum phenomenon. There is moderate disc space narrowing at all other levels, essentially stable. There is facet osteoarthritic change at all levels bilaterally. There is atherosclerotic calcification in the  aorta. IMPRESSION: Persistent multilevel arthropathy. Scoliosis. No acute fracture or spondylolisthesis. There is aortic atherosclerosis. Electronically Signed   By: Bretta BangWilliam  Woodruff III M.D.   On: 05/21/2016 16:54   Ct Head Wo Contrast  Result Date: 05/22/2016 CLINICAL DATA:  82 y/o F; 2 weeks of generalized weakness with onset of altered mental status. EXAM: CT HEAD WITHOUT CONTRAST TECHNIQUE: Contiguous axial images were obtained from the base of the skull  through the vertex without intravenous contrast. COMPARISON:  None. FINDINGS: Brain: No evidence for large acute infarct, focal mass effect, or intracranial hemorrhage. Chronic lacunar infarct in left cerebellar hemisphere and bilateral putamen. Mild chronic microvascular ischemic changes and parenchymal volume loss for age. No hydrocephalus or extra-axial collection. Right posterior lateral frontal dural calcified lesion measuring 7 mm is probably a meningioma. Vascular: No hyperdense vessel. Calcific atherosclerosis of cavernous internal carotid arteries and vertebral arteries. Skull: No displaced calvarial fracture. Small nonspecific lucencies in the calvarium probably a arachnoid granulations. Sinuses/Orbits: Visualized paranasal sinuses and mastoid air cells are normally aerated. Bilateral intra-ocular lens replacement. Other: None. IMPRESSION: 1. No acute intracranial abnormality identified. 2. Mild chronic microvascular ischemic changes and parenchymal volume loss for age. Small basal ganglia and left cerebellar chronic lacunar infarcts. 3. Probable right posterior lateral frontal 7 mm meningioma. Electronically Signed   By: Mitzi HansenLance  Furusawa-Stratton M.D.   On: 05/22/2016 01:12   Mr Lumbar Spine Wo Contrast  Result Date: 05/21/2016 CLINICAL DATA:  Generalized weakness for 2 weeks.  Low back pain. EXAM: MRI LUMBAR SPINE WITHOUT CONTRAST TECHNIQUE: Multiplanar, multisequence MR imaging of the lumbar spine was performed. No intravenous contrast was administered. COMPARISON:  Lumbar spine radiograph 05/21/2016 FINDINGS: Segmentation: Normal. The lowest disc space is considered to be L5-S1. Alignment: Possible trace retrolisthesis at L2-L3. There is left convex lumbar scoliosis with apex at the L2-L3 level. Vertebrae: No acute compression fracture, facet edema or focal marrow lesion. Conus medullaris: Extends to the L2 level and appears normal. Paraspinal and other soft tissues: The visualized aorta, IVC and  iliac vessels are normal. The visualized retroperitoneal organs and paraspinal soft tissues are normal. Disc levels: T12-L1: Minimal disc herniation. No spinal canal or neural foraminal stenosis. L1-L2: Narrowing of the intervertebral disc space without significant bulge. No spinal canal stenosis. Mild right neuroforaminal stenosis. L2-L3: Narrowing of the intervertebral disc space with endplate osteophytosis, greater on the right. No significant disc bulge. Normal facets. No spinal canal stenosis. Moderate right neuroforaminal stenosis. L3-L4: Normal disc space and facet joints. No spinal canal stenosis. No neuroforaminal stenosis. L4-L5: There is moderate left facet hypertrophy. Small disc bulge. No spinal canal stenosis. Moderate to severe left neuroforaminal stenosis. L5-S1: Minimal disc bulge. Mild facet hypertrophy. No spinal canal stenosis. No neuroforaminal stenosis. IMPRESSION: 1. Moderate to severe left neural foraminal stenosis at L4-L5, primarily due to left facet overgrowth. 2. Multilevel degenerative disc dizziness without spinal canal stenosis. 3. Moderate right L2-L3 foraminal stenosis due to combination of scoliosis and endplate osteophytosis. Electronically Signed   By: Deatra RobinsonKevin  Herman M.D.   On: 05/21/2016 21:17   Ct Renal Stone Study  Result Date: 05/11/2016 CLINICAL DATA:  Left flank and severe low back pain for 2 weeks. History of kidney stones. EXAM: CT ABDOMEN AND PELVIS WITHOUT CONTRAST TECHNIQUE: Multidetector CT imaging of the abdomen and pelvis was performed following the standard protocol without IV contrast. COMPARISON:  Lumbar spine radiographs 08/09/2010. Report from CT 03/18/2001. FINDINGS: Lower chest: Moderate to large hiatal hernia is incompletely visualized. There is linear atelectasis  or scarring at both lung bases. No significant pleural or pericardial effusion. There is atherosclerosis of the thoracic aorta. Hepatobiliary: There is a 16 mm low-density cyst in the dome of  the right hepatic lobe on image 7. There is additional low density medially in the left lobe on image 16. No suspicious hepatic findings on noncontrast imaging. The gallbladder is incompletely distended. No evidence of calcified gallstones, surrounding inflammatory change or biliary dilatation. Pancreas: Unremarkable. No pancreatic ductal dilatation or surrounding inflammatory changes. Spleen: Normal in size without focal abnormality. Adrenals/Urinary Tract: Mild low-density prominence of both adrenal glands, likely due to hyperplasia. No suspicious adrenal findings. Both kidneys demonstrate mild cortical thinning. There is a 5 mm calcification in the upper pole the right kidney which may reflect a nonobstructing calculus. No other urinary tract calculi are seen. There is no evidence of hydronephrosis or perinephric soft tissue stranding. The bladder appears unremarkable. Stomach/Bowel: No evidence of bowel wall thickening, distention or surrounding inflammatory change. The cecum appears mobile. The appendix appears normal. Mildly prominent stool throughout the colon. There are mild diverticular changes of the sigmoid colon. Vascular/Lymphatic: There are no enlarged abdominal or pelvic lymph nodes. Diffuse aortic and branch vessel atherosclerosis. Reproductive: The uterus and adnexa appear unremarkable for age. No evidence of adnexal mass or pelvic inflammatory process. Other: No evidence of abdominal wall mass or hernia. No ascites. Musculoskeletal: No acute or significant osseous findings. There are degenerative changes throughout the lumbar spine associated with a convex left scoliosis. IMPRESSION: 1. No acute findings or explanation for the patient's symptoms. 2. No evidence of hydronephrosis or left-sided urinary tract calculus. Possible small nonobstructing calculus in the upper pole of the right kidney. 3. Moderate to large hiatal hernia. 4. Aortic and branch vessel atherosclerosis. Electronically Signed    By: Carey Bullocks M.D.   On: 05/11/2016 19:38     PERTINENT LAB RESULTS: CBC:  Recent Labs  05/21/16 1715  WBC 7.1  HGB 12.0  HCT 36.5  PLT 348   CMET CMP     Component Value Date/Time   NA 141 05/22/2016 0739   K 3.6 05/22/2016 0739   CL 106 05/22/2016 0739   CO2 22 05/22/2016 0739   GLUCOSE 59 (L) 05/22/2016 0739   BUN 28 (H) 05/22/2016 0739   CREATININE 1.41 (H) 05/22/2016 0739   CREATININE 1.84 (H) 05/02/2016 1554   CALCIUM 8.5 (L) 05/22/2016 0739   PROT 6.2 (L) 05/21/2016 1715   ALBUMIN 3.5 05/21/2016 1715   AST 18 05/21/2016 1715   ALT 17 05/21/2016 1715   ALKPHOS 63 05/21/2016 1715   BILITOT 1.2 05/21/2016 1715   GFRNONAA 32 (L) 05/22/2016 0739   GFRAA 37 (L) 05/22/2016 0739    GFR Estimated Creatinine Clearance: 19 mL/min (by C-G formula based on SCr of 1.41 mg/dL (H)). No results for input(s): LIPASE, AMYLASE in the last 72 hours. No results for input(s): CKTOTAL, CKMB, CKMBINDEX, TROPONINI in the last 72 hours. Invalid input(s): POCBNP No results for input(s): DDIMER in the last 72 hours. No results for input(s): HGBA1C in the last 72 hours. No results for input(s): CHOL, HDL, LDLCALC, TRIG, CHOLHDL, LDLDIRECT in the last 72 hours.  Recent Labs  05/21/16 2355  TSH 4.188    Recent Labs  05/22/16 0739  VITAMINB12 529   Coags: No results for input(s): INR in the last 72 hours.  Invalid input(s): PT Microbiology: Recent Results (from the past 240 hour(s))  Urine culture     Status: Abnormal (Preliminary  result)   Collection Time: 05/21/16  9:12 PM  Result Value Ref Range Status   Specimen Description URINE, CATHETERIZED  Final   Special Requests NONE  Final   Culture 40,000 COLONIES/mL GRAM NEGATIVE RODS (A)  Final   Report Status PENDING  Incomplete    FURTHER DISCHARGE INSTRUCTIONS:  Get Medicines reviewed and adjusted: Please take all your medications with you for your next visit with your Primary MD  Laboratory/radiological  data: Please request your Primary MD to go over all hospital tests and procedure/radiological results at the follow up, please ask your Primary MD to get all Hospital records sent to his/her office.  In some cases, they will be blood work, cultures and biopsy results pending at the time of your discharge. Please request that your primary care M.D. goes through all the records of your hospital data and follows up on these results.  Also Note the following: If you experience worsening of your admission symptoms, develop shortness of breath, life threatening emergency, suicidal or homicidal thoughts you must seek medical attention immediately by calling 911 or calling your MD immediately  if symptoms less severe.  You must read complete instructions/literature along with all the possible adverse reactions/side effects for all the Medicines you take and that have been prescribed to you. Take any new Medicines after you have completely understood and accpet all the possible adverse reactions/side effects.   Do not drive when taking Pain medications or sleeping medications (Benzodaizepines)  Do not take more than prescribed Pain, Sleep and Anxiety Medications. It is not advisable to combine anxiety,sleep and pain medications without talking with your primary care practitioner  Special Instructions: If you have smoked or chewed Tobacco  in the last 2 yrs please stop smoking, stop any regular Alcohol  and or any Recreational drug use.  Wear Seat belts while driving.  Please note: You were cared for by a hospitalist during your hospital stay. Once you are discharged, your primary care physician will handle any further medical issues. Please note that NO REFILLS for any discharge medications will be authorized once you are discharged, as it is imperative that you return to your primary care physician (or establish a relationship with a primary care physician if you do not have one) for your post hospital  discharge needs so that they can reassess your need for medications and monitor your lab values.  Total Time spent coordinating discharge including counseling, education and face to face time equals 45 minutes.  SignedJeoffrey Massed 05/23/2016 11:54 AM

## 2016-05-23 NOTE — Progress Notes (Signed)
Nsg Discharge Note  Admit Date:  05/21/2016 Discharge date: 05/23/2016   Sol PasserBettie M Levario to be D/C'd Skilled nursing facility per MD order.  AVS completed.  Copy for chart, and copy for patient signed, and dated. Patient/caregiver able to verbalize understanding.  Discharge Medication:   Medication List    STOP taking these medications   Rivaroxaban 15 MG Tabs tablet Commonly known as:  XARELTO     TAKE these medications   acetaminophen 500 MG tablet Commonly known as:  TYLENOL Take 2 tablets (1,000 mg total) by mouth every 8 (eight) hours as needed. What changed:  how much to take  when to take this  reasons to take this   apixaban 2.5 MG Tabs tablet Commonly known as:  ELIQUIS Take 1 tablet (2.5 mg total) by mouth 2 (two) times daily.   diltiazem 120 MG 24 hr capsule Commonly known as:  CARDIZEM CD Take 1 capsule (120 mg total) by mouth daily.   furosemide 40 MG tablet Commonly known as:  LASIX TAKE 1 TABLET BY MOUTH EVERY MORNING AND 1/2 TABLET EVERY AFTERNOON AS DIRECTED (WILL PAY 3/1) What changed:  how much to take  how to take this  when to take this  additional instructions   metoprolol succinate 50 MG 24 hr tablet Commonly known as:  TOPROL-XL TAKE 1 TABLET (50 MG TOTAL) BY MOUTH DAILY. TAKE WITH OR IMMEDIATELY FOLLOWING A MEAL.   potassium chloride 10 MEQ tablet Commonly known as:  KLOR-CON M10 Take 1 tablet (10 mEq total) by mouth daily.       Discharge Assessment: Vitals:   05/22/16 2140 05/23/16 0540  BP: 109/64 109/72  Pulse: 84 60  Resp:  18  Temp:  98.3 F (36.8 C)   Skin clean, dry and intact without evidence of skin break down, no evidence of skin tears noted. IV catheter discontinued intact. Site without signs and symptoms of complications - no redness or edema noted at insertion site, patient denies c/o pain - only slight tenderness at site.  Dressing with slight pressure applied.  D/c Instructions-Education: Discharge  instructions given to patient/family with verbalized understanding. D/c education completed with patient/family including follow up instructions, medication list, d/c activities limitations if indicated, with other d/c instructions as indicated by MD - patient able to verbalize understanding, all questions fully answered. Patient instructed to return to ED, call 911, or call MD for any changes in condition.  [facility] Report given to nurse at center.   Camillo FlamingVicki L Hellen Shanley, RN 05/23/2016 1:54 PM

## 2016-05-24 LAB — URINE CULTURE: Culture: 40000 — AB

## 2016-06-13 ENCOUNTER — Emergency Department (HOSPITAL_COMMUNITY)
Admission: EM | Admit: 2016-06-13 | Discharge: 2016-06-13 | Disposition: A | Discharge status: Home / Self Care | Payer: Medicare Other | Attending: Emergency Medicine | Admitting: Emergency Medicine

## 2016-06-13 ENCOUNTER — Encounter (HOSPITAL_COMMUNITY): Payer: Self-pay | Admitting: Emergency Medicine

## 2016-06-13 ENCOUNTER — Emergency Department (HOSPITAL_COMMUNITY): Payer: Medicare Other

## 2016-06-13 DIAGNOSIS — Y999 Unspecified external cause status: Secondary | ICD-10-CM | POA: Diagnosis not present

## 2016-06-13 DIAGNOSIS — Y9289 Other specified places as the place of occurrence of the external cause: Secondary | ICD-10-CM | POA: Insufficient documentation

## 2016-06-13 DIAGNOSIS — S51812A Laceration without foreign body of left forearm, initial encounter: Secondary | ICD-10-CM | POA: Diagnosis not present

## 2016-06-13 DIAGNOSIS — Z7901 Long term (current) use of anticoagulants: Secondary | ICD-10-CM | POA: Insufficient documentation

## 2016-06-13 DIAGNOSIS — W19XXXA Unspecified fall, initial encounter: Secondary | ICD-10-CM | POA: Insufficient documentation

## 2016-06-13 DIAGNOSIS — R4182 Altered mental status, unspecified: Secondary | ICD-10-CM | POA: Diagnosis not present

## 2016-06-13 DIAGNOSIS — I5032 Chronic diastolic (congestive) heart failure: Secondary | ICD-10-CM | POA: Diagnosis not present

## 2016-06-13 DIAGNOSIS — S6992XA Unspecified injury of left wrist, hand and finger(s), initial encounter: Secondary | ICD-10-CM | POA: Diagnosis present

## 2016-06-13 DIAGNOSIS — Z79899 Other long term (current) drug therapy: Secondary | ICD-10-CM | POA: Insufficient documentation

## 2016-06-13 DIAGNOSIS — Y939 Activity, unspecified: Secondary | ICD-10-CM | POA: Insufficient documentation

## 2016-06-13 DIAGNOSIS — N189 Chronic kidney disease, unspecified: Secondary | ICD-10-CM | POA: Diagnosis not present

## 2016-06-13 LAB — COMPREHENSIVE METABOLIC PANEL
ALBUMIN: 3.4 g/dL — AB (ref 3.5–5.0)
ALT: 14 U/L (ref 14–54)
ANION GAP: 10 (ref 5–15)
AST: 19 U/L (ref 15–41)
Alkaline Phosphatase: 94 U/L (ref 38–126)
BILIRUBIN TOTAL: 0.9 mg/dL (ref 0.3–1.2)
BUN: 18 mg/dL (ref 6–20)
CO2: 27 mmol/L (ref 22–32)
Calcium: 9.6 mg/dL (ref 8.9–10.3)
Chloride: 101 mmol/L (ref 101–111)
Creatinine, Ser: 1.82 mg/dL — ABNORMAL HIGH (ref 0.44–1.00)
GFR calc non Af Amer: 23 mL/min — ABNORMAL LOW (ref >60)
GFR, EST AFRICAN AMERICAN: 27 mL/min — AB (ref >60)
GLUCOSE: 96 mg/dL (ref 65–99)
POTASSIUM: 4.6 mmol/L (ref 3.5–5.1)
SODIUM: 138 mmol/L (ref 135–145)
TOTAL PROTEIN: 6.1 g/dL — AB (ref 6.5–8.1)

## 2016-06-13 LAB — URINE MICROSCOPIC-ADD ON

## 2016-06-13 LAB — CBC WITH DIFFERENTIAL/PLATELET
BASOS ABS: 0 10*3/uL (ref 0.0–0.1)
Basophils Relative: 0 %
EOS PCT: 1 %
Eosinophils Absolute: 0.1 10*3/uL (ref 0.0–0.7)
HCT: 34.2 % — ABNORMAL LOW (ref 36.0–46.0)
HEMOGLOBIN: 10.9 g/dL — AB (ref 12.0–15.0)
LYMPHS ABS: 0.9 10*3/uL (ref 0.7–4.0)
LYMPHS PCT: 12 %
MCH: 29.7 pg (ref 26.0–34.0)
MCHC: 31.9 g/dL (ref 30.0–36.0)
MCV: 93.2 fL (ref 78.0–100.0)
Monocytes Absolute: 0.7 10*3/uL (ref 0.1–1.0)
Monocytes Relative: 9 %
NEUTROS ABS: 5.4 10*3/uL (ref 1.7–7.7)
NEUTROS PCT: 78 %
Platelets: 276 10*3/uL (ref 150–400)
RBC: 3.67 MIL/uL — AB (ref 3.87–5.11)
RDW: 14 % (ref 11.5–15.5)
WBC: 7 10*3/uL (ref 4.0–10.5)

## 2016-06-13 LAB — URINALYSIS, ROUTINE W REFLEX MICROSCOPIC
Bilirubin Urine: NEGATIVE
GLUCOSE, UA: NEGATIVE mg/dL
Ketones, ur: NEGATIVE mg/dL
NITRITE: NEGATIVE
PROTEIN: NEGATIVE mg/dL
Specific Gravity, Urine: 1.012 (ref 1.005–1.030)
pH: 6 (ref 5.0–8.0)

## 2016-06-13 LAB — CK: CK TOTAL: 20 U/L — AB (ref 38–234)

## 2016-06-13 MED ORDER — CLINDAMYCIN HCL 300 MG PO CAPS: 300.0000 mg | ORAL_CAPSULE | Freq: Three times a day (TID) | ORAL | 0 refills | Status: DC

## 2016-06-13 MED ORDER — SODIUM CHLORIDE 0.9 % IV BOLUS (SEPSIS)
500.0000 mL | Freq: Once | INTRAVENOUS | Status: AC
  Administered 2016-06-13: 500 mL via INTRAVENOUS

## 2016-06-13 MED ORDER — CEPHALEXIN 500 MG PO CAPS: 500.0000 mg | ORAL_CAPSULE | Freq: Three times a day (TID) | ORAL | 0 refills | Status: AC

## 2016-06-13 MED ORDER — TETANUS-DIPHTH-ACELL PERTUSSIS 5-2.5-18.5 LF-MCG/0.5 IM SUSP
0.5000 mL | Freq: Once | INTRAMUSCULAR | Status: AC
  Administered 2016-06-13: 0.5 mL via INTRAMUSCULAR
  Filled 2016-06-13: qty 0.5

## 2016-06-13 NOTE — ED Notes (Signed)
Patient transported to x-ray. ?

## 2016-06-13 NOTE — ED Notes (Signed)
Pt departed in NAD, transported to SNF by [facility].

## 2016-06-13 NOTE — ED Notes (Signed)
Skin tear on left forearm covered with xeroform and gauze.

## 2016-06-13 NOTE — Care Management (Addendum)
ED CM consulted concerning patient needing placement. CM reviewed patient's record, and met with patient and family at bedside. Melanie Blake (neice) POA.  Rosann Auerbach reports having a ready bed at [facility], as per Alessandra Bevels at [phone]. CM contact Abigail Butts, she states, patient has a bed rm 3221, the facility will need a copy of ED discharge summary faxed to [phone] Att: Mariann Laster at the facility. CM updated Dr. Myrene Buddy, and Authur RN of care transitional plan to [facility]. Patient will be transported via [facility] unable to ambulate safely independently family is agreeable with plan. ED RN will need to call report to SNF prior to discharge. No further CM needs identified

## 2016-06-13 NOTE — ED Triage Notes (Signed)
Niece stated, She was in a Nursing facility for the 20 day Medicare payment, went home and then fell yesterday, c/o left arm pain , back pain and some confusion.  In the process of getting new doctor

## 2016-06-13 NOTE — ED Provider Notes (Addendum)
[facility]-EMERGENCY DEPT Provider Note   CSN: [id number] Arrival date & time: 06/13/16  1056     History   Chief Complaint Chief Complaint  Patient presents with  . Fall  . Arm Pain  . Altered Mental Status    HPI Melanie Blake is a 82 y.o. female.  HPI   82 yo F with PMHx of AFib, cDCHF who p/w fall. Pt was just d/c from a SNF 24 hr ago. She returned home and states She felt more tired than she expected. While going to the bathroom at night, she reportedly fell and sustained a right forearm skin tear. Her niece entered the house and found her on the ground this morning. It is unclear how long she was down. Patient states that she feels tired and feels like she needs to go back to the facility. She denies any current complaints other than feeling fatigued. She has no chest pain, shortness of breath, abdominal pain, nausea, or vomiting. She endorses sharp pain at her skin tear site that is worse with movement or palpation, but denies any fevers or surrounding redness. She is not immune suppressed. She is unsure whether she hit her head. She does not believe she lost consciousness.  Past Medical History:  Diagnosis Date  . Atrial fibrillation (HCC)    dx 10/2012; s/p DCCV => NSR 12/23/2012  . Cataract   . Chronic diastolic heart failure (HCC)    a. Echo 4/14: EF "grossly normal", mild AI, mild MR, PASP 40, mild reduced RVSF    Patient Active Problem List   Diagnosis Date Noted  . UTI (urinary tract infection) 05/21/2016  . Generalized weakness 05/21/2016  . Altered mental status 05/21/2016  . CKD (chronic kidney disease) 11/13/2013  . Atrial fibrillation (HCC) 11/11/2012  . Chronic diastolic CHF (congestive heart failure) (HCC) 11/11/2012  . Atrial fibrillation with RVR (HCC) 10/28/2012  . Acute diastolic CHF (congestive heart failure) (HCC) 10/28/2012  . Elevated TSH 10/28/2012    Past Surgical History:  Procedure Laterality Date  . CARDIOVERSION N/A 12/23/2012   Procedure:  CARDIOVERSION;  Surgeon: Laurey Morale, MD;  Location: [facility] ENDOSCOPY;  Service: Cardiovascular;  Laterality: N/A;    OB History    No data available       Home Medications    Prior to Admission medications   Medication Sig Start Date End Date Taking? Authorizing Provider  acetaminophen (TYLENOL) 500 MG tablet Take 2 tablets (1,000 mg total) by mouth every 8 (eight) hours as needed. 05/23/16  Yes Shanker Levora Dredge, MD  apixaban (ELIQUIS) 2.5 MG TABS tablet Take 1 tablet (2.5 mg total) by mouth 2 (two) times daily. 05/23/16  Yes Shanker Levora Dredge, MD  Cholecalciferol (VITAMIN D3) 50000 units CAPS Take 1 capsule by mouth 2 (two) times a week. Tuesdays and Fridays   Yes Historical Provider, MD  diltiazem (CARDIZEM CD) 120 MG 24 hr capsule Take 1 capsule (120 mg total) by mouth daily. 05/02/16  Yes Laurey Morale, MD  furosemide (LASIX) 40 MG tablet TAKE 1 TABLET BY MOUTH EVERY MORNING AND 1/2 TABLET EVERY AFTERNOON AS DIRECTED (WILL PAY 3/1) Patient taking differently: Take 20-40 mg by mouth See admin instructions. Pt takes 40mg  in am, 20mg  in pm 05/02/16  Yes Laurey Morale, MD  levothyroxine (SYNTHROID, LEVOTHROID) 50 MCG tablet Take 50 mcg by mouth daily before breakfast.   Yes Historical Provider, MD  metoprolol succinate (TOPROL-XL) 50 MG 24 hr tablet TAKE 1 TABLET (50 MG TOTAL)  BY MOUTH DAILY. TAKE WITH OR IMMEDIATELY FOLLOWING A MEAL. 05/02/16  Yes Laurey Moralealton S McLean, MD  potassium chloride (KLOR-CON M10) 10 MEQ tablet Take 1 tablet (10 mEq total) by mouth daily. 05/02/16  Yes Laurey Moralealton S McLean, MD  clindamycin (CLEOCIN) 300 MG capsule Take 1 capsule (300 mg total) by mouth 3 (three) times daily. 06/13/16 06/20/16  Shaune Pollackameron Makylee Sanborn, MD    Family History Family History  Problem Relation Age of Onset  . Heart attack Neg Hx     Social History Social History  Substance Use Topics  . Smoking status: Never Smoker  . Smokeless tobacco: Never Used  . Alcohol use No     Allergies     Penicillins and Sulfa antibiotics   Review of Systems Review of Systems  Constitutional: Positive for fatigue. Negative for chills and fever.  HENT: Negative for congestion and rhinorrhea.   Eyes: Negative for visual disturbance.  Respiratory: Negative for cough, shortness of breath and wheezing.   Cardiovascular: Negative for chest pain and leg swelling.  Gastrointestinal: Negative for abdominal pain, diarrhea, nausea and vomiting.  Genitourinary: Negative for dysuria and flank pain.  Musculoskeletal: Negative for neck pain and neck stiffness.  Skin: Negative for rash and wound.  Allergic/Immunologic: Negative for immunocompromised state.  Neurological: Negative for syncope, weakness and headaches.  All other systems reviewed and are negative.    Physical Exam Updated Vital Signs BP 122/86 (BP Location: Right Arm)   Pulse 92   Temp 97.7 F (36.5 C) (Oral)   Resp 25   Ht 5' (1.524 m)   Wt 96 lb (43.5 kg)   SpO2 95%   BMI 18.75 kg/m   Physical Exam  Constitutional: She is oriented to person, place, and time. She appears well-developed and well-nourished. No distress.  HENT:  Head: Normocephalic and atraumatic.  Eyes: Conjunctivae are normal.  Neck: Neck supple.  Cardiovascular: Normal rate, regular rhythm and normal heart sounds.  Exam reveals no friction rub.   No murmur heard. Pulmonary/Chest: Effort normal and breath sounds normal. No respiratory distress. She has no wheezes. She has no rales.  Abdominal: She exhibits no distension.  Musculoskeletal: She exhibits no edema.  Neurological: She is alert and oriented to person, place, and time. She exhibits normal muscle tone.  Skin: Skin is warm. Capillary refill takes less than 2 seconds.  Psychiatric: She has a normal mood and affect.  Nursing note and vitals reviewed.   UPPER EXTREMITY EXAM: LEFT  INSPECTION & PALPATION: Large superficial skin tear to left forearm with fibrinous base. No surrounding erythema  or induration. No deep tissue involvement.  SENSORY: Sensation is intact to light touch in:  Superficial radial nerve distribution (dorsal first web space) Median nerve distribution (tip of index finger)   Ulnar nerve distribution (tip of small finger)     MOTOR:  + Motor posterior interosseous nerve (thumb IP extension) + Anterior interosseous nerve (thumb IP flexion, index finger DIP flexion) + Radial nerve (wrist extension) + Median nerve (palpable firing thenar mass) + Ulnar nerve (palpable firing of first dorsal interosseous muscle)  VASCULAR: 2+ radial pulse Brisk capillary refill < 2 sec, fingers warm and well-perfused   ED Treatments / Results  Labs (all labs ordered are listed, but only abnormal results are displayed) Labs Reviewed  CBC WITH DIFFERENTIAL/PLATELET - Abnormal; Notable for the following:       Result Value   RBC 3.67 (*)    Hemoglobin 10.9 (*)    HCT 34.2 (*)  All other components within normal limits  COMPREHENSIVE METABOLIC PANEL - Abnormal; Notable for the following:    Creatinine, Ser 1.82 (*)    Total Protein 6.1 (*)    Albumin 3.4 (*)    GFR calc non Af Amer 23 (*)    GFR calc Af Amer 27 (*)    All other components within normal limits  CK - Abnormal; Notable for the following:    Total CK 20 (*)    All other components within normal limits  URINALYSIS, ROUTINE W REFLEX MICROSCOPIC (NOT AT [facility])    EKG  EKG Interpretation None       Radiology Dg Chest 2 View  Result Date: 06/13/2016 CLINICAL DATA:  Larey Seat from standing position 2 days ago, history chronic diastolic heart failure, atrial fibrillation EXAM: CHEST  2 VIEW COMPARISON:  05/21/2016 FINDINGS: Kyphotic positioning. Enlargement of cardiac silhouette. Atherosclerotic calcification aorta. Mediastinal contours and pulmonary vascularity normal. Large hiatal hernia. RIGHT basilar atelectasis and mild central bronchitic changes. No acute infiltrate, pleural effusion or pneumothorax.  Bones diffusely demineralized with chronic compression fracture of a lower thoracic vertebra grossly unchanged. IMPRESSION: Enlargement of cardiac silhouette. Large hiatal hernia. Bronchitic changes with RIGHT basilar atelectasis. Chronic lower thoracic compression fracture. Electronically Signed   By: Ulyses Southward M.D.   On: 06/13/2016 16:00   Dg Forearm Left  Result Date: 06/13/2016 CLINICAL DATA:  Fall with pain EXAM: LEFT FOREARM - 2 VIEW COMPARISON:  None. FINDINGS: Bones appear osteopenic. No gross fracture lucency visualized within the forearm bones. Limited evaluation for elbow effusion due to positioning. Vascular calcifications in the soft tissues. Questionable cortical discontinuity within the mid scaphoid. IMPRESSION: 1. No gross fracture or malalignment of the forearm bones. 2. Questionable cortical discontinuity in the mid scaphoid. Correlate clinically for focal tenderness to this area, dedicated views of the wrist with scaphoid view may be obtained if suspect fracture. Electronically Signed   By: Jasmine Pang M.D.   On: 06/13/2016 16:04   Ct Head Wo Contrast  Result Date: 06/13/2016 CLINICAL DATA:  Fall yesterday, left arm pain, back pain, some confusion. EXAM: CT HEAD WITHOUT CONTRAST TECHNIQUE: Contiguous axial images were obtained from the base of the skull through the vertex without intravenous contrast. COMPARISON:  Head CT dated 05/22/2016 FINDINGS: Brain: Again noted is generalized age related parenchymal atrophy with commensurate dilatation of the ventricles and sulci. Chronic small vessel ischemic changes again noted within the bilateral periventricular white matter regions. Small old lacunar infarcts again noted within the basal ganglia regions. There is no mass, hemorrhage, edema or other evidence of acute parenchymal abnormality. No extra-axial hemorrhage. Vascular: There are chronic calcified atherosclerotic changes of the large vessels at the skull base. No unexpected  hyperdense vessel. Skull: Normal. Negative for fracture or focal lesion. Sinuses/Orbits: No acute finding. Other: None. IMPRESSION: 1. No acute findings. No intracranial mass, hemorrhage or edema. No skull fracture. 2. Atrophy and chronic ischemic changes, as detailed above. Electronically Signed   By: Bary Richard M.D.   On: 06/13/2016 16:09    Procedures Procedures (including critical care time)  Medications Ordered in ED Medications  sodium chloride 0.9 % bolus 500 mL (500 mLs Intravenous New Bag/Given 06/13/16 1649)  Tdap (BOOSTRIX) injection 0.5 mL (not administered)     Initial Impression / Assessment and Plan / ED Course  I have reviewed the triage vital signs and the nursing notes.  Pertinent labs & imaging results that were available during my care of the patient were  reviewed by me and considered in my medical decision making (see chart for details).  Clinical Course     82 yo F with PMhx as above here with fall at home, left forearm skin tear. Pt just discharged from SNF. On arrival, VSS and WNL. Exam is as above. Regarding her fall, suspect this is 2/2 deconditioning in setting of recent hospitalization, 20 day SNF placement. Though she passed PT, do not feel she is safe for returning home and she agrees. She has been accepted back to old SNF, and SW consulted for placement. Otherwise, she states she is well. CXR shows no PNA or focal abnormality. Plain film of forearm negative. CBC, CMP, CK at baseline. Cr 1.82. Last Cr. 1.6, but baseline 1.6-2.0. 500 cc NS given. Will start keflex for wound ppx for left forearm wound, place in facility.  Awaiting SW consultation for placement. UA also pending.  UA with mild pyuria, likely contaminant, though will treat for both skin tear and pyuria with keflex. O/w pt well appearing and VSS. Do not feel she needs obs in hospital. SW has evaluated, and pt will be placed in facility. She will be d/c from ED to SNF, and family is in agreement.  D/c with wound care, Keflex, outpt f/u.  Final Clinical Impressions(s) / ED Diagnoses   Final diagnoses:  Fall, initial encounter  Skin tear of left forearm without complication, initial encounter    New Prescriptions New Prescriptions   CLINDAMYCIN (CLEOCIN) 300 MG CAPSULE    Take 1 capsule (300 mg total) by mouth 3 (three) times daily.     Shaune Pollackameron Kaysa Roulhac, MD 06/13/16 1739    Shaune Pollackameron Nethra Mehlberg, MD 06/13/16 [phone]

## 2016-06-13 NOTE — Discharge Instructions (Signed)
Keep your skin tear/wound clean and dry, with dressing changes twice daily until healed. Take the antibiotics as prescribed.  You can apply Xeroform or another non-stick dressing to the wound, followed by dry dressing.  Monitor for any worsening redness, pain, or signs of infection.

## 2016-06-15 LAB — URINE CULTURE: Special Requests: NORMAL

## 2016-06-25 ENCOUNTER — Other Ambulatory Visit: Payer: Self-pay | Admitting: Cardiology

## 2016-06-25 NOTE — Telephone Encounter (Signed)
Medication Detail    Disp Refills Start End   diltiazem (CARDIZEM CD) 120 MG 24 hr capsule 90 capsule 3 05/02/2016    Sig - Route: Take 1 capsule (120 mg total) by mouth daily. - Oral   E-Prescribing Status: Receipt confirmed by pharmacy (05/02/2016 4:02 PM EDT)   Pharmacy   [id number]- [place], [place] - [address]

## 2016-08-27 ENCOUNTER — Ambulatory Visit (INDEPENDENT_AMBULATORY_CARE_PROVIDER_SITE_OTHER): Payer: Medicare Other | Admitting: Ophthalmology

## 2016-10-24 ENCOUNTER — Encounter: Payer: Self-pay | Admitting: Internal Medicine

## 2016-10-24 ENCOUNTER — Encounter (INDEPENDENT_AMBULATORY_CARE_PROVIDER_SITE_OTHER): Payer: Self-pay

## 2016-10-24 ENCOUNTER — Ambulatory Visit (INDEPENDENT_AMBULATORY_CARE_PROVIDER_SITE_OTHER): Payer: Medicare Other | Admitting: Internal Medicine

## 2016-10-24 VITALS — BP 102/50 | HR 73 | Ht 60.0 in | Wt 108.4 lb

## 2016-10-24 DIAGNOSIS — I959 Hypotension, unspecified: Secondary | ICD-10-CM

## 2016-10-24 DIAGNOSIS — N183 Chronic kidney disease, stage 3 unspecified: Secondary | ICD-10-CM

## 2016-10-24 DIAGNOSIS — I5032 Chronic diastolic (congestive) heart failure: Secondary | ICD-10-CM | POA: Diagnosis not present

## 2016-10-24 DIAGNOSIS — I480 Paroxysmal atrial fibrillation: Secondary | ICD-10-CM | POA: Diagnosis not present

## 2016-10-24 NOTE — Progress Notes (Signed)
Patient ID: Melanie Blake, female   DOB: 1924-03-02, 83 y.o.   MRN: [id number]  83 y.o. woman with minimal past history was admitted to [facility] in 4/14 with complaints of weakness and dyspnea.  She was found to have atrial fibrillation with RVR in the 140s.  She was noted to have acute on chronic diastolic CHF.  She was rate-controlled and TEE was attempted pre-cardioversion.  TEE probe could not be passed.  Therefore, cardioversion was performed after 4 weeks of anticoagulation with return to NSR in 6/14.  At followup appointment after this, she was back in atrial fibrillation but spontaneously converted to NSR.  She has remained in atrial fibrillation since that time.  She was last seen by Dr. Shirlee Latch in 04/2016. Since that time, she has a few falls and has moved from independent living to assisted living. She has also struggled with intense back pain over the last few months, though this is now improving with medical therapy. She was started on 3 new medications for her back pain but does not recall the names of any of them. She denies palpitations, lightheadedness, chest pain, shortness of breath, orthopnea, and leg swelling. Rivaroxaban was switched to apixaban during her transition from independent to assisted living. She is tolerating this well without bleeding. No further falls in the last 2 months. Labs are being checked routinely at assisted living.  Labs (4/14): K 4.4, creatinine 1.3 => 1.4, HCT 42.7 Labs (6/14): K 4, creatinine 1.35 Labs (10/14): K 3.9, creatinine 1.4, HCT 36.2 Labs (1/15): K 4.2, creatinine 1.6, HCT 36.5 Labs (4/15): HCT 36.7 Labs (7/15): K 3.6, creatinine 1.47 Labs (11/15): K 3.7, creatinine 1.6, HCT 37.2 Labs (2/16): K 4, creatinine 1.4, BNP 137 Labs (3/17): K 4.4, creatinine 1.61, hgb 13.4 Labs (11/17): K 4.6, creatinine 1.8, hgb 10.9  ECG: Atrial fibrillation with occasional aberrancy. Non-specific T-wave changes.  PMH: 1. Atrial fibrillation: Chronic.   Diagnosed in 4/14.  DCCV to NSR in 6/14. Back in atrial fibrillation in 10/15.  2. Chronic diastolic CHF: Echo (4/14) with EF "grossly normal," mild AI, mild MR, PA systolic pressure 40 mm Hg, mildly decreased RV systolic function.  3. CKD  SH: Single, lives alone in assisted living, niece lives nearby, nonsmoker.   FH: No CAD.    ROS: All systems reviewed and negative except as per HPI.  Current Outpatient Prescriptions  Medication Sig Dispense Refill  . acetaminophen (TYLENOL) 500 MG tablet Take 2 tablets (1,000 mg total) by mouth every 8 (eight) hours as needed. 30 tablet 0  . apixaban (ELIQUIS) 2.5 MG TABS tablet Take 1 tablet (2.5 mg total) by mouth 2 (two) times daily.    . Cholecalciferol (VITAMIN D3) 50000 units CAPS Take 1 capsule by mouth 2 (two) times a week. Tuesdays and Fridays    . diltiazem (CARDIZEM CD) 120 MG 24 hr capsule Take 1 capsule (120 mg total) by mouth daily. 90 capsule 3  . escitalopram (LEXAPRO) 10 MG tablet Take 10 mg by mouth daily.    . furosemide (LASIX) 40 MG tablet TAKE 1 TABLET BY MOUTH EVERY MORNING AND 1/2 TABLET EVERY AFTERNOON AS DIRECTED (WILL PAY 3/1) (Patient taking differently: Take 20-40 mg by mouth See admin instructions. Pt takes  in am,  in pm) 135 tablet 3  . HYDROcodone-acetaminophen (NORCO/VICODIN) 5-325 MG tablet Take 1 tablet by mouth every 6 (six) hours as needed for moderate pain.    Marland Kitchen levothyroxine (SYNTHROID, LEVOTHROID) 50 MCG tablet Take 50 mcg  by mouth daily before breakfast.    . metoprolol succinate (TOPROL-XL) 50 MG 24 hr tablet TAKE 1 TABLET (50 MG TOTAL) BY MOUTH DAILY. TAKE WITH OR IMMEDIATELY FOLLOWING A MEAL. 90 tablet 3  . potassium chloride (KLOR-CON M10) 10 MEQ tablet Take 1 tablet (10 mEq total) by mouth daily. 90 tablet 3  . traMADol (ULTRAM) 50 MG tablet Take 50 mg by mouth every 6 (six) hours as needed.     No current facility-administered medications for this visit.     BP (!) 86/48 (BP Location: Right Arm,  Cuff Size: Normal)   Pulse 73   Ht 5' (1.524 m)   Wt 108 lb 6.4 oz (49.2 kg)   SpO2 98%   BMI 21.17 kg/m   Repeat BP: 102/50 General: Elderly lady with notable kyphosis, seated comfortably in the exam room. Neck: Supple without lymphadenopathy, thyromegaly, JVD, or HJR. Lungs: Clear bilaterally without wheezes or crackles. Normal work of breathing. CV: Irregularly irregular with 1/6 systolic murmur loudest at the left lower sternal border. No rubs or gallops. Nondisplaced PMI. Abdomen: Soft, nontender, no hepatosplenomegaly, no distention.  Skin: Venous stasis dermatitis lower legs.   Neurologic: Alert and oriented x 3.  Psych: Normal affect. Extremities: No lower extremity edema. 2+ radial and pedal pulses bilaterally.  Assessment/Plan: 1. Permanent atrial fibrillation:  Patient adequately rate controlled and without symptoms.   - Continue apixaban 2.5 mg BID with CBC and renal function monitoring per medical team at assisted living - Continue diltiazem CD and Toprol XL.  Rate control is reasonable.    2. Chronic diastolic CHF: NYHA class II symptoms.  Patient appears euvolemic and well compensated. No changes in her medications today. 3. CKD: Being followed at assisted living. 4. Hypotension: Initial blood pressure readings quite low, though on manual repeat by myself, blood pressure is improved but still somewhat low. Patient is asymptomatic. We discussed discontinuation of diltiazem and/or de-escalation of furosemide but have agreed to continue her current medications. Patient encouraged to stay well-hydrated.  Follow-up: Return to clinic in 6 months.  Jynesis Nakamura 10/25/2016

## 2016-10-24 NOTE — Patient Instructions (Signed)
Medication Instructions:  Your physician recommends that you continue on your current medications as directed. Please refer to the Current Medication list given to you today.    Labwork: None   Testing/Procedures: None   Follow-Up: Your physician wants you to follow-up in: 6 months with Dr End. (October 2018). You will receive a reminder letter in the mail two months in advance. If you don't receive a letter, please call our office to schedule the follow-up appointment.        If you need a refill on your cardiac medications before your next appointment, please call your pharmacy.   

## 2016-10-25 ENCOUNTER — Encounter: Payer: Self-pay | Admitting: Internal Medicine

## 2016-10-27 ENCOUNTER — Emergency Department (HOSPITAL_COMMUNITY): Payer: Medicare Other

## 2016-10-27 ENCOUNTER — Observation Stay (HOSPITAL_COMMUNITY)
Admission: EM | Admit: 2016-10-27 | Discharge: 2016-10-28 | Disposition: A | Discharge status: Home / Self Care | Payer: Medicare Other | Attending: Internal Medicine | Admitting: Internal Medicine

## 2016-10-27 ENCOUNTER — Encounter (HOSPITAL_COMMUNITY): Payer: Self-pay

## 2016-10-27 DIAGNOSIS — Z880 Allergy status to penicillin: Secondary | ICD-10-CM | POA: Diagnosis not present

## 2016-10-27 DIAGNOSIS — W19XXXA Unspecified fall, initial encounter: Secondary | ICD-10-CM | POA: Diagnosis not present

## 2016-10-27 DIAGNOSIS — Z9181 History of falling: Secondary | ICD-10-CM | POA: Diagnosis not present

## 2016-10-27 DIAGNOSIS — I5032 Chronic diastolic (congestive) heart failure: Secondary | ICD-10-CM | POA: Diagnosis not present

## 2016-10-27 DIAGNOSIS — D62 Acute posthemorrhagic anemia: Secondary | ICD-10-CM | POA: Diagnosis not present

## 2016-10-27 DIAGNOSIS — Y92199 Unspecified place in other specified residential institution as the place of occurrence of the external cause: Secondary | ICD-10-CM | POA: Diagnosis not present

## 2016-10-27 DIAGNOSIS — K922 Gastrointestinal hemorrhage, unspecified: Secondary | ICD-10-CM | POA: Diagnosis present

## 2016-10-27 DIAGNOSIS — K449 Diaphragmatic hernia without obstruction or gangrene: Secondary | ICD-10-CM | POA: Diagnosis not present

## 2016-10-27 DIAGNOSIS — Z66 Do not resuscitate: Secondary | ICD-10-CM | POA: Diagnosis not present

## 2016-10-27 DIAGNOSIS — D649 Anemia, unspecified: Secondary | ICD-10-CM

## 2016-10-27 DIAGNOSIS — M79661 Pain in right lower leg: Secondary | ICD-10-CM | POA: Insufficient documentation

## 2016-10-27 DIAGNOSIS — I482 Chronic atrial fibrillation: Secondary | ICD-10-CM | POA: Insufficient documentation

## 2016-10-27 DIAGNOSIS — R42 Dizziness and giddiness: Secondary | ICD-10-CM | POA: Insufficient documentation

## 2016-10-27 DIAGNOSIS — Z7901 Long term (current) use of anticoagulants: Secondary | ICD-10-CM | POA: Diagnosis not present

## 2016-10-27 DIAGNOSIS — N183 Chronic kidney disease, stage 3 unspecified: Secondary | ICD-10-CM | POA: Diagnosis present

## 2016-10-27 DIAGNOSIS — Z882 Allergy status to sulfonamides status: Secondary | ICD-10-CM | POA: Diagnosis not present

## 2016-10-27 DIAGNOSIS — I4891 Unspecified atrial fibrillation: Secondary | ICD-10-CM | POA: Diagnosis present

## 2016-10-27 LAB — POC OCCULT BLOOD, ED: Fecal Occult Bld: POSITIVE — AB

## 2016-10-27 LAB — CBC WITH DIFFERENTIAL/PLATELET
Basophils Absolute: 0 10*3/uL (ref 0.0–0.1)
Basophils Relative: 0 %
EOS PCT: 0 %
Eosinophils Absolute: 0 10*3/uL (ref 0.0–0.7)
HEMATOCRIT: 27.2 % — AB (ref 36.0–46.0)
Hemoglobin: 8.1 g/dL — ABNORMAL LOW (ref 12.0–15.0)
LYMPHS ABS: 0.7 10*3/uL (ref 0.7–4.0)
LYMPHS PCT: 9 %
MCH: 24.1 pg — AB (ref 26.0–34.0)
MCHC: 29.8 g/dL — ABNORMAL LOW (ref 30.0–36.0)
MCV: 81 fL (ref 78.0–100.0)
MONO ABS: 0.8 10*3/uL (ref 0.1–1.0)
Monocytes Relative: 11 %
Neutro Abs: 6.2 10*3/uL (ref 1.7–7.7)
Neutrophils Relative %: 80 %
PLATELETS: 274 10*3/uL (ref 150–400)
RBC: 3.36 MIL/uL — ABNORMAL LOW (ref 3.87–5.11)
RDW: 14.5 % (ref 11.5–15.5)
WBC: 7.8 10*3/uL (ref 4.0–10.5)

## 2016-10-27 LAB — COMPREHENSIVE METABOLIC PANEL
ALBUMIN: 3.3 g/dL — AB (ref 3.5–5.0)
ALT: 11 U/L — ABNORMAL LOW (ref 14–54)
AST: 16 U/L (ref 15–41)
Alkaline Phosphatase: 68 U/L (ref 38–126)
Anion gap: 11 (ref 5–15)
BUN: 25 mg/dL — AB (ref 6–20)
CHLORIDE: 104 mmol/L (ref 101–111)
CO2: 26 mmol/L (ref 22–32)
Calcium: 9 mg/dL (ref 8.9–10.3)
Creatinine, Ser: 1.46 mg/dL — ABNORMAL HIGH (ref 0.44–1.00)
GFR calc Af Amer: 35 mL/min — ABNORMAL LOW (ref >60)
GFR calc non Af Amer: 30 mL/min — ABNORMAL LOW (ref >60)
GLUCOSE: 107 mg/dL — AB (ref 65–99)
POTASSIUM: 4 mmol/L (ref 3.5–5.1)
SODIUM: 141 mmol/L (ref 135–145)
Total Bilirubin: 0.7 mg/dL (ref 0.3–1.2)
Total Protein: 6.3 g/dL — ABNORMAL LOW (ref 6.5–8.1)

## 2016-10-27 LAB — URINALYSIS, ROUTINE W REFLEX MICROSCOPIC
BACTERIA UA: NONE SEEN
BILIRUBIN URINE: NEGATIVE
Glucose, UA: NEGATIVE mg/dL
KETONES UR: NEGATIVE mg/dL
Leukocytes, UA: NEGATIVE
Nitrite: NEGATIVE
PH: 5 (ref 5.0–8.0)
Protein, ur: NEGATIVE mg/dL
SPECIFIC GRAVITY, URINE: 1.01 (ref 1.005–1.030)

## 2016-10-27 LAB — BRAIN NATRIURETIC PEPTIDE: B Natriuretic Peptide: 427.7 pg/mL — ABNORMAL HIGH (ref 0.0–100.0)

## 2016-10-27 LAB — I-STAT CG4 LACTIC ACID, ED: Lactic Acid, Venous: 1.52 mmol/L (ref 0.5–1.9)

## 2016-10-27 LAB — I-STAT TROPONIN, ED: Troponin i, poc: 0.01 ng/mL (ref 0.00–0.08)

## 2016-10-27 MED ORDER — SODIUM CHLORIDE 0.9 % IV SOLN
10.0000 mL/h | Freq: Once | INTRAVENOUS | Status: AC
  Administered 2016-10-28: 10 mL/h via INTRAVENOUS

## 2016-10-27 MED ORDER — ACETAMINOPHEN 325 MG PO TABS
650.0000 mg | ORAL_TABLET | Freq: Once | ORAL | Status: AC
  Administered 2016-10-27: 650 mg via ORAL
  Filled 2016-10-27: qty 2

## 2016-10-27 MED ORDER — ONDANSETRON HCL 4 MG/2ML IJ SOLN
4.0000 mg | Freq: Once | INTRAMUSCULAR | Status: AC
  Administered 2016-10-27: 4 mg via INTRAVENOUS
  Filled 2016-10-27: qty 2

## 2016-10-27 MED ORDER — SODIUM CHLORIDE 0.9 % IV BOLUS (SEPSIS)
500.0000 mL | Freq: Once | INTRAVENOUS | Status: AC
  Administered 2016-10-27: 500 mL via INTRAVENOUS

## 2016-10-27 NOTE — ED Notes (Signed)
1st set of blood cultures drawn with IV start and placed at bedside if needed.

## 2016-10-27 NOTE — ED Notes (Signed)
Dr Plunkett in room 

## 2016-10-27 NOTE — ED Provider Notes (Signed)
[facility]EMERGENCY DEPT Provider Note   CSN: [id number] Arrival date & time: 10/27/16  1810     History   Chief Complaint Chief Complaint  Patient presents with  . Fall  . Leg Pain    HPI Melanie Blake is a 83 y.o. female.  Patient is a 83 year old female with a history of atrial fibrillation currently on anticoagulation, chronic diastolic heart failure, stage III kidney disease presenting today from assisted living for her fall. Facility reported patient fell last night. Patient states she was bending over in the shower and she fell. She is uncertain if she hit her head or lost consciousness. She can remember the fall. However since this time she's had difficulty ambulating due to pain in the right lower leg and knee. Also she states for the last day or 2 or at least since the fall she has not felt well. She complains of a dizzy sensation and has had one episode of vomiting. She's also noticed a cough for the last day or 2. She also complains of generalized weakness. Since transport here she's had some mild shortness of breath but niece states they saw the cardiologist last week and everything looked good.  She has not eaten today but denies any abdominal pain, diarrhea or urinary symptoms.  She does not have a headache at this time.   The history is provided by the patient and a relative.  Fall   Leg Pain      Past Medical History:  Diagnosis Date  . Atrial fibrillation (HCC)    dx 10/2012; s/p DCCV => NSR 12/23/2012  . Cataract   . Chronic diastolic heart failure (HCC)    a. Echo 4/14: EF "grossly normal", mild AI, mild MR, PASP 40, mild reduced RVSF    Patient Active Problem List   Diagnosis Date Noted  . UTI (urinary tract infection) 05/21/2016  . Generalized weakness 05/21/2016  . Altered mental status 05/21/2016  . Stage 3 chronic kidney disease 11/13/2013  . Atrial fibrillation (HCC) 11/11/2012  . Chronic diastolic heart failure (HCC) 11/11/2012  . Atrial  fibrillation with RVR (HCC) 10/28/2012  . Acute diastolic CHF (congestive heart failure) (HCC) 10/28/2012  . Elevated TSH 10/28/2012    Past Surgical History:  Procedure Laterality Date  . CARDIOVERSION N/A 12/23/2012   Procedure: CARDIOVERSION;  Surgeon: Laurey Morale, MD;  Location: [facility] ENDOSCOPY;  Service: Cardiovascular;  Laterality: N/A;    OB History    No data available       Home Medications    Prior to Admission medications   Medication Sig Start Date End Date Taking? Authorizing Provider  acetaminophen (TYLENOL) 500 MG tablet Take 2 tablets (1,000 mg total) by mouth every 8 (eight) hours as needed. 05/23/16   Shanker Levora Dredge, MD  apixaban (ELIQUIS) 2.5 MG TABS tablet Take 1 tablet (2.5 mg total) by mouth 2 (two) times daily. 05/23/16   Shanker Levora Dredge, MD  Cholecalciferol (VITAMIN D3) 50000 units CAPS Take 1 capsule by mouth 2 (two) times a week. Tuesdays and Fridays    Historical Provider, MD  diltiazem (CARDIZEM CD) 120 MG 24 hr capsule Take 1 capsule (120 mg total) by mouth daily. 05/02/16   Laurey Morale, MD  escitalopram (LEXAPRO) 10 MG tablet Take 10 mg by mouth daily.    Historical Provider, MD  furosemide (LASIX) 40 MG tablet TAKE 1 TABLET BY MOUTH EVERY MORNING AND 1/2 TABLET EVERY AFTERNOON AS DIRECTED (WILL PAY 3/1) Patient taking differently: Take  20-40 mg by mouth See admin instructions. Pt takes  in am,  in pm 05/02/16   Laurey Morale, MD  HYDROcodone-acetaminophen (NORCO/VICODIN) 5-325 MG tablet Take 1 tablet by mouth every 6 (six) hours as needed for moderate pain.    Historical Provider, MD  levothyroxine (SYNTHROID, LEVOTHROID) 50 MCG tablet Take 50 mcg by mouth daily before breakfast.    Historical Provider, MD  metoprolol succinate (TOPROL-XL) 50 MG 24 hr tablet TAKE 1 TABLET (50 MG TOTAL) BY MOUTH DAILY. TAKE WITH OR IMMEDIATELY FOLLOWING A MEAL. 05/02/16   Laurey Morale, MD  potassium chloride (KLOR-CON M10) 10 MEQ tablet Take 1 tablet (10  mEq total) by mouth daily. 05/02/16   Laurey Morale, MD  traMADol (ULTRAM) 50 MG tablet Take 50 mg by mouth every 6 (six) hours as needed.    Historical Provider, MD    Family History Family History  Problem Relation Age of Onset  . Heart attack Neg Hx     Social History Social History  Substance Use Topics  . Smoking status: Never Smoker  . Smokeless tobacco: Never Used  . Alcohol use No     Allergies   Penicillins and Sulfa antibiotics   Review of Systems Review of Systems  All other systems reviewed and are negative.    Physical Exam Updated Vital Signs BP (!) 116/55   Pulse 77   Temp 100.2 F (37.9 C) (Rectal)   Resp 15   Ht 5' (1.524 m)   Wt 108 lb (49 kg)   SpO2 100%   BMI 21.09 kg/m   Physical Exam  Constitutional: She is oriented to person, place, and time. She appears well-developed and well-nourished.  Looks uncomfortable  HENT:  Head: Normocephalic and atraumatic.  Dry mucous membranes  Eyes: Conjunctivae and EOM are normal. Pupils are equal, round, and reactive to light.  Neck: Normal range of motion. Neck supple.  Cardiovascular: Normal rate and intact distal pulses.  An irregularly irregular rhythm present.  Murmur heard.  Systolic murmur is present with a grade of 2/6  Pulmonary/Chest: Effort normal and breath sounds normal. Tachypnea noted. No respiratory distress. She has no wheezes. She has no rales.  Abdominal: Soft. She exhibits no distension. There is no tenderness. There is no rebound and no guarding.  Musculoskeletal: Normal range of motion. She exhibits tenderness. She exhibits no edema.  And chemosis but appears to be healing over the right upper extremity and left shoulder. Ecchymosis over the right lower leg. Tenderness with palpation to the distal tib-fib but no localized ankle tenderness. 1+ DP pulse in the right foot with normal sensation. Ecchymosis over the right knee but full range of motion of the knee minimal tenderness with  palpation of the patella. No tenderness with bilateral hip range of motion or palpation  Neurological: She is alert and oriented to person, place, and time.  Able to lift arms and legs bilaterally.  No notable weakness or sensory deficits.  No nystagmus  Skin: Skin is warm and dry. No rash noted. No erythema. There is pallor.  Psychiatric: She has a normal mood and affect. Her behavior is normal.  Nursing note and vitals reviewed.    ED Treatments / Results  Labs (all labs ordered are listed, but only abnormal results are displayed) Labs Reviewed  COMPREHENSIVE METABOLIC PANEL - Abnormal; Notable for the following:       Result Value   Glucose, Bld 107 (*)    BUN 25 (*)  Creatinine, Ser 1.46 (*)    Total Protein 6.3 (*)    Albumin 3.3 (*)    ALT 11 (*)    GFR calc non Af Amer 30 (*)    GFR calc Af Amer 35 (*)    All other components within normal limits  CBC WITH DIFFERENTIAL/PLATELET - Abnormal; Notable for the following:    RBC 3.36 (*)    Hemoglobin 8.1 (*)    HCT 27.2 (*)    MCH 24.1 (*)    MCHC 29.8 (*)    All other components within normal limits  URINALYSIS, ROUTINE W REFLEX MICROSCOPIC - Abnormal; Notable for the following:    Color, Urine STRAW (*)    Hgb urine dipstick SMALL (*)    Squamous Epithelial / LPF 0-5 (*)    All other components within normal limits  BRAIN NATRIURETIC PEPTIDE - Abnormal; Notable for the following:    B Natriuretic Peptide 427.7 (*)    All other components within normal limits  POC OCCULT BLOOD, ED - Abnormal; Notable for the following:    Fecal Occult Bld POSITIVE (*)    All other components within normal limits  CULTURE, BLOOD (ROUTINE X 2)  CULTURE, BLOOD (ROUTINE X 2)  URINE CULTURE  I-STAT CG4 LACTIC ACID, ED  I-STAT TROPOININ, ED  I-STAT CG4 LACTIC ACID, ED    EKG  EKG Interpretation  Date/Time:  Monday October 27 2016 18:18:31 EDT Ventricular Rate:  79 PR Interval:    QRS Duration: 92 QT Interval:  399 QTC  Calculation: 458 R Axis:   55 Text Interpretation:  Atrial fibrillation Minimal ST depression, inferior leads No significant change since last tracing Confirmed by Anitra Lauth  MD, Alphonzo Lemmings ([id number]) on 10/27/2016 9:59:38 PM       Radiology Ct Head Wo Contrast  Result Date: 10/27/2016 CLINICAL DATA:  Larey Seat during the night.  Dizziness. EXAM: CT HEAD WITHOUT CONTRAST TECHNIQUE: Contiguous axial images were obtained from the base of the skull through the vertex without intravenous contrast. COMPARISON:  06/13/2016 FINDINGS: Brain: There is no evidence for acute hemorrhage, hydrocephalus, mass lesion, or abnormal extra-axial fluid collection. No definite CT evidence for acute infarction. Diffuse loss of parenchymal volume is consistent with atrophy. Patchy low attenuation in the deep hemispheric and periventricular white matter is nonspecific, but likely reflects chronic microvascular ischemic demyelination. Lacunar infarct noted in the left basal ganglia. Vascular: No hyperdense vessel or unexpected calcification. Skull: No evidence for fracture. No worrisome lytic or sclerotic lesion. Visualized portions of the globes and intraorbital fat are unremarkable. Sinuses/Orbits: The visualized paranasal sinuses and mastoid air cells are clear. Other: None. IMPRESSION: 1. Stable.  No acute intracranial abnormality. 2. Atrophy with chronic small vessel white matter ischemic disease. Electronically Signed   By: Kennith Center M.D.   On: 10/27/2016 20:11    Procedures Procedures (including critical care time)  Medications Ordered in ED Medications  0.9 %  sodium chloride infusion (not administered)  ondansetron (ZOFRAN) injection 4 mg (4 mg Intravenous Given 10/27/16 1910)  sodium chloride 0.9 % bolus 500 mL (0 mLs Intravenous Stopped 10/27/16 2045)  acetaminophen (TYLENOL) tablet 650 mg (650 mg Oral Given 10/27/16 2035)     Initial Impression / Assessment and Plan / ED Course  I have reviewed the triage vital signs  and the nursing notes.  Pertinent labs & imaging results that were available during my care of the patient were reviewed by me and considered in my medical decision making (see chart for  details).     Patient is a 83 year old elderly female coming in today status post fall and generally not feeling well. Sounds like the fall was last night or 2 days ago. Patient cannot remember. However she is currently complaining of feeling ill. She complains of dizziness and had one episode of vomiting. She has not eaten today due to lack of appetite. Mild cough today only. Also is having pain in the right lower extremity mostly in the knee and lower leg that has prevented her from walking on her own.  Family who is present states she is not acting herself. She does not recall hitting her head and denies a headache. She lost her balance which is what made her fall but denies any syncope. She has not had any chest pain or palpitations. She is currently complaining of some mild shortness of breath but that is only started in the last 4-5 hours. On exam patient looks ill appearing.  Mild tachypnea with generalized bruising. She does have some pain in her leg and heart murmur with A. fib. No significant findings of fluid overload. Concern for infectious process versus head bleed due to recent fall and anticoagulation versus cardiac cause of her symptoms. Rectal temperature was 100.2 and patient was started on sepsis protocol and given Tylenol. However patient still expressed her desire to be DO NOT RESUSCITATE and given prior history of heart failure she was started with a small bolus of 500 mL. 30/kg bolus would be 1.5 L. Patient's lactate was within normal limits.  New found anemia with a hemoglobin of 8 with stable renal function. UA pending. Chest x-ray without acute findings, tib-fib without acute bony injury, head CT without acute bleed. Patient given Zofran for her nausea and dizziness and Tylenol.  UA within normal  limits. Based on cardiology evaluation 3 days ago she was also noted to be hypotensive which she has been here as well. Her diltiazem was stopped and Lasix was decreased. BNP today is 400 but x-ray does not show any signs of fluid overload. Hemoccult positive today and patient is on anticoagulation.  Daughter states that 6 weeks ago she had labs checked in at that time she was noted for the first time to have anemia but unclear what the numbers were. Given patient's dizziness and falls could be related to her anemia. She is agreeable to blood transfusion but refuses colonoscopy. Question whether patient should continue on anticoagulation. Also temperature of 100.2 without known source. Blood cultures and urine cultures pending. Will admit for further care and give 1 unit of blood.  Final Clinical Impressions(s) / ED Diagnoses   Final diagnoses:  Anemia, unspecified type  Gastrointestinal hemorrhage, unspecified gastrointestinal hemorrhage type    New Prescriptions New Prescriptions   No medications on file     Gwyneth Sprout, MD 10/27/16 2329

## 2016-10-27 NOTE — ED Triage Notes (Signed)
Per EMS, Pt from [facility], pt got up to go to the bathroom last night and had a mechanical fall last. Unknown LOC but states "I don't think I hit my head" but endorses dizziness. Pt normally walks with a walker but did not have walker when she fell. Pt did not tell staff until this morning that she fell. Pt began complaining of right lower leg pain and ankle pain today. PERRL, neuro intact. Pt also had 1 episode of vomiting just prior to transport, denies nausea now. Pt's niece informed EMS that the pt seemed more sluggish today. 12 lead shoed a-fib which is normal for patient, VSS.

## 2016-10-28 ENCOUNTER — Encounter (HOSPITAL_COMMUNITY): Payer: Self-pay | Admitting: Internal Medicine

## 2016-10-28 DIAGNOSIS — I480 Paroxysmal atrial fibrillation: Secondary | ICD-10-CM

## 2016-10-28 DIAGNOSIS — D62 Acute posthemorrhagic anemia: Secondary | ICD-10-CM

## 2016-10-28 DIAGNOSIS — K922 Gastrointestinal hemorrhage, unspecified: Secondary | ICD-10-CM

## 2016-10-28 LAB — COMPREHENSIVE METABOLIC PANEL
ALBUMIN: 2.8 g/dL — AB (ref 3.5–5.0)
ALK PHOS: 60 U/L (ref 38–126)
ALT: 11 U/L — ABNORMAL LOW (ref 14–54)
AST: 21 U/L (ref 15–41)
Anion gap: 8 (ref 5–15)
BUN: 24 mg/dL — ABNORMAL HIGH (ref 6–20)
CHLORIDE: 110 mmol/L (ref 101–111)
CO2: 25 mmol/L (ref 22–32)
Calcium: 8.4 mg/dL — ABNORMAL LOW (ref 8.9–10.3)
Creatinine, Ser: 1.46 mg/dL — ABNORMAL HIGH (ref 0.44–1.00)
GFR calc Af Amer: 35 mL/min — ABNORMAL LOW (ref >60)
GFR calc non Af Amer: 30 mL/min — ABNORMAL LOW (ref >60)
GLUCOSE: 96 mg/dL (ref 65–99)
POTASSIUM: 3.8 mmol/L (ref 3.5–5.1)
SODIUM: 143 mmol/L (ref 135–145)
Total Bilirubin: 2.1 mg/dL — ABNORMAL HIGH (ref 0.3–1.2)
Total Protein: 5.4 g/dL — ABNORMAL LOW (ref 6.5–8.1)

## 2016-10-28 LAB — CBC WITH DIFFERENTIAL/PLATELET
BASOS PCT: 0 %
Basophils Absolute: 0 10*3/uL (ref 0.0–0.1)
EOS ABS: 0.1 10*3/uL (ref 0.0–0.7)
EOS PCT: 1 %
HCT: 28.9 % — ABNORMAL LOW (ref 36.0–46.0)
HEMOGLOBIN: 8.8 g/dL — AB (ref 12.0–15.0)
Lymphocytes Relative: 4 %
Lymphs Abs: 0.3 10*3/uL — ABNORMAL LOW (ref 0.7–4.0)
MCH: 25.2 pg — ABNORMAL LOW (ref 26.0–34.0)
MCHC: 30.4 g/dL (ref 30.0–36.0)
MCV: 82.8 fL (ref 78.0–100.0)
MONOS PCT: 2 %
Monocytes Absolute: 0.2 10*3/uL (ref 0.1–1.0)
Neutro Abs: 7.4 10*3/uL (ref 1.7–7.7)
Neutrophils Relative %: 93 %
PLATELETS: 230 10*3/uL (ref 150–400)
RBC: 3.49 MIL/uL — ABNORMAL LOW (ref 3.87–5.11)
RDW: 14.7 % (ref 11.5–15.5)
WBC: 8 10*3/uL (ref 4.0–10.5)

## 2016-10-28 LAB — PREPARE RBC (CROSSMATCH)

## 2016-10-28 LAB — MRSA PCR SCREENING: MRSA BY PCR: NEGATIVE

## 2016-10-28 LAB — ABO/RH: ABO/RH(D): A POS

## 2016-10-28 MED ORDER — TRAMADOL HCL 50 MG PO TABS
50.0000 mg | ORAL_TABLET | Freq: Four times a day (QID) | ORAL | Status: DC
  Administered 2016-10-28 (×2): 50 mg via ORAL
  Filled 2016-10-28 (×2): qty 1

## 2016-10-28 MED ORDER — ESCITALOPRAM OXALATE 10 MG PO TABS: 10.0000 mg | ORAL_TABLET | Freq: Every day | ORAL | Status: DC

## 2016-10-28 MED ORDER — HYDROCODONE-ACETAMINOPHEN 5-325 MG PO TABS: 1.0000 | ORAL_TABLET | Freq: Four times a day (QID) | ORAL | Status: DC | PRN

## 2016-10-28 MED ORDER — ACETAMINOPHEN 325 MG PO TABS: 650.0000 mg | ORAL_TABLET | Freq: Four times a day (QID) | ORAL | Status: DC | PRN

## 2016-10-28 MED ORDER — LEVOTHYROXINE SODIUM 50 MCG PO TABS
50.0000 ug | ORAL_TABLET | Freq: Every day | ORAL | Status: DC
  Administered 2016-10-28: 50 ug via ORAL
  Filled 2016-10-28: qty 1

## 2016-10-28 MED ORDER — DIAZEPAM 5 MG PO TABS
5.0000 mg | ORAL_TABLET | Freq: Two times a day (BID) | ORAL | Status: DC
  Administered 2016-10-28: 5 mg via ORAL
  Filled 2016-10-28: qty 1

## 2016-10-28 MED ORDER — ENSURE ENLIVE PO LIQD: 237.0000 mL | Freq: Two times a day (BID) | ORAL | Status: DC

## 2016-10-28 MED ORDER — MIRTAZAPINE 15 MG PO TABS: 15.0000 mg | ORAL_TABLET | Freq: Every day | ORAL | Status: DC

## 2016-10-28 MED ORDER — ACETAMINOPHEN 650 MG RE SUPP: 650.0000 mg | Freq: Four times a day (QID) | RECTAL | Status: DC | PRN

## 2016-10-28 NOTE — Clinical Social Work Note (Signed)
Clinical Social Work Assessment  Patient Details  Name: Melanie Blake MRN: [id number] Date of Birth: Apr 11, 1924  Date of referral:  10/28/16               Reason for consult:  Discharge Planning                Permission sought to share information with:  Family Supports Permission granted to share information::  Yes, Verbal Permission Granted  Name::     Melanie Blake  Agency::     Relationship::  niece  Contact Information:  [phone]  Housing/Transportation Living arrangements for the past 2 months:  Assisted Living Facility Source of Information:  Other (Comment Required) Patient Interpreter Needed:  None Criminal Activity/Legal Involvement Pertinent to Current Situation/Hospitalization:  No - Comment as needed Significant Relationships:  Other Family Members Lives with:  Facility Resident Do you feel safe going back to the place where you live?  Yes Need for family participation in patient care:  Yes (Comment)  Care giving concerns:  Patient lives in an assisted living facility ([facility])   Social Worker assessment / plan:  CSW spoke to patient niece Melanie Blake. Melanie Blake stated that she is agreeable for patient to discharge to SNF. Melanie Blake stated she would like [facility] because patent has been at facility in the past.  Employment status:  Retired Health and safety inspector:  Medicare PT Recommendations:  Skilled Nursing Facility Information / Referral to community resources:  Skilled Nursing Facility  Patient/Family's Response to care:  Patient verbalized appreciation and understanding and for CSW tole and involvement in care  Patient/Family's Understanding of and Emotional Response to Diagnosis, Current Treatment, and Prognosis: Family with good understanding of current medical state and limitations around most recent hospitalization.  Emotional Assessment Appearance:  Appears stated age Attitude/Demeanor/Rapport:  Unable to Assess Affect (typically observed):   Unable to Assess Orientation:  Oriented to Self Alcohol / Substance use:  Not Applicable Psych involvement (Current and /or in the community):  No (Comment)  Discharge Needs  Concerns to be addressed:  No discharge needs identified Readmission within the last 30 days:  No Current discharge risk:  None Barriers to Discharge:  No Barriers Identified   Althea Charon, LCSW 10/28/2016, 2:50 PM

## 2016-10-28 NOTE — NC FL2 (Signed)
[place] MEDICAID FL2 LEVEL OF CARE SCREENING TOOL     IDENTIFICATION  Patient Name: Melanie Blake Birthdate: 12-09-1923 Sex: female Admission Date (Current Location): 10/27/2016  [facility] and [place] Number:  Producer, television/film/video and Address:  [facility], [address], [place], [place] [address]      Provider Number: [id number]  Attending Physician Name and Address:  Jordan Hawks*  Relative Name and Phone Number:  Sandford Craze, [phone]    Current Level of Care: Hospital Recommended Level of Care: Skilled Nursing Facility Prior Approval Number:    Date Approved/Denied:   PASRR Number: [id number]  Discharge Plan: SNF    Current Diagnoses: Patient Active Problem List   Diagnosis Date Noted  . Gastrointestinal hemorrhage 10/28/2016  . Acute GI bleeding 10/28/2016  . Acute blood loss anemia 10/27/2016  . UTI (urinary tract infection) 05/21/2016  . Generalized weakness 05/21/2016  . Altered mental status 05/21/2016  . Stage 3 chronic kidney disease 11/13/2013  . Atrial fibrillation (HCC) 11/11/2012  . Chronic diastolic heart failure (HCC) 11/11/2012  . Atrial fibrillation with RVR (HCC) 10/28/2012  . Acute diastolic CHF (congestive heart failure) (HCC) 10/28/2012  . Elevated TSH 10/28/2012    Orientation RESPIRATION BLADDER Height & Weight     Self  O2 (2L) Incontinent Weight: 106 lb 4.2 oz (48.2 kg) Height:  5' (152.4 cm)  BEHAVIORAL SYMPTOMS/MOOD NEUROLOGICAL BOWEL NUTRITION STATUS      Incontinent Diet (Diet soft)  AMBULATORY STATUS COMMUNICATION OF NEEDS Skin   Extensive Assist Verbally Normal                       Personal Care Assistance Level of Assistance  Bathing, Feeding, Dressing Bathing Assistance: Limited assistance Feeding assistance: Limited assistance Dressing Assistance: Limited assistance     Functional Limitations Info  Sight, Hearing, Speech Sight Info: Adequate Hearing Info:  Adequate Speech Info: Impaired    SPECIAL CARE FACTORS FREQUENCY  PT (By licensed PT), OT (By licensed OT)     PT Frequency: 5x week OT Frequency: 5x week            Contractures      Additional Factors Info  Code Status, Allergies Code Status Info: DNR Allergies Info: PENICILLINS, SULFA ANTIBIOTICS           Current Medications (10/28/2016):  This is the current hospital active medication list Current Facility-Administered Medications  Medication Dose Route Frequency Provider Last Rate Last Dose  . acetaminophen (TYLENOL) tablet 650 mg  650 mg Oral Q6H PRN Eduard Clos, MD       Or  . acetaminophen (TYLENOL) suppository 650 mg  650 mg Rectal Q6H PRN Eduard Clos, MD      . diazepam (VALIUM) tablet 5 mg  5 mg Oral BID Eduard Clos, MD   5 mg at 10/28/16 1009  . escitalopram (LEXAPRO) tablet 10 mg  10 mg Oral QHS Eduard Clos, MD      . feeding supplement (ENSURE ENLIVE) (ENSURE ENLIVE) liquid 237 mL  237 mL Oral BID BM Jordan Hawks, DO      . HYDROcodone-acetaminophen (NORCO/VICODIN) 5-325 MG per tablet 1 tablet  1 tablet Oral Q6H PRN Eduard Clos, MD      . levothyroxine (SYNTHROID, LEVOTHROID) tablet 50 mcg  50 mcg Oral QAC breakfast Eduard Clos, MD   50 mcg at 10/28/16 0604  . mirtazapine (REMERON) tablet 15 mg  15 mg Oral QHS Eduard Clos, MD      . traMADol Janean Sark) tablet 50 mg  50 mg Oral Q6H Eduard Clos, MD   50 mg at 10/28/16 1252     Discharge Medications: Please see discharge summary for a list of discharge medications.  Relevant Imaging Results:  Relevant Lab Results:   Additional Information    Althea Charon, LCSW

## 2016-10-28 NOTE — Progress Notes (Signed)
Initial Nutrition Assessment  DOCUMENTATION CODES:   Not applicable  INTERVENTION:   -D/c Ensure Enlive po BID, each supplement provides 350 kcal and 20 grams of protein -Boost Breeze po daily, each supplement provides 250 kcal and 9 grams of protein -30 ml Prostat BID, each supplement provides 100 kcals and 15 grams of protein  NUTRITION DIAGNOSIS:   Inadequate oral intake related to altered GI function as evidenced by per patient/family report, meal completion < 50%.  GOAL:   Patient will meet greater than or equal to 90% of their needs  MONITOR:   PO intake, Supplement acceptance, Labs, Weight trends, Skin, I & O's  REASON FOR ASSESSMENT:   Low Braden    ASSESSMENT:   Melanie Blake is a 83 y.o. female with history of permanent atrial fibrillation, diastolic CHF, chronic kidney disease was brought to the ER after patient had a fall at her living facility. Patient had a fall 2 days ago and since then patient has been feeling weak and off recently has been getting dizzy. Patient's Cardizem was held and Lasix dose was decreased due to low normal blood pressure.    Pt admitted with acute blood loss anemia from GIB and s/p fall.   Spoke with pt, who provided minimal hx; she appeared to be selective about which questions she answered. Pt reports her appetite "comes and goes". She reports she consumes 3 meals per day, but would not elaborate on the type of food she eats. Breakfast tray at bedside; pt consumed all of juice and two cartons of milk. She reports she was given Ensure last night, but did not like the taste of it.   Reviewed wt hx, which reveals wt stability over the past year. When questions about weight changes, pt reports "I think it's stayed the same".   Nutrition-Focused physical exam completed. Findings are mild to moderate fat depletion, mild to moderate muscle depletion, and mild edema. Mild to moderate fat and muscle depletion is found mostly in upper and  lower extremities, which is common in the elderly population. Pt denies any changes in mobility, states she uses a walker to get around.   Medications reviewed and include remeron. MAR from [facility] reviewed and pertinent medications included KCl and furosemide.   Discussed importance of good meal intake to promote healing.   Labs reviewed.   Diet Order:  DIET SOFT Room service appropriate? Yes; Fluid consistency: Thin Diet - low sodium heart healthy  Skin:  Reviewed, no issues  Last BM:  PTA  Height:   Ht Readings from Last 1 Encounters:  10/27/16 5' (1.524 m)    Weight:   Wt Readings from Last 1 Encounters:  10/28/16 106 lb 4.2 oz (48.2 kg)    Ideal Body Weight:  45.5 kg  BMI:  Body mass index is 20.75 kg/m.  Estimated Nutritional Needs:   Kcal:  1100-1300  Protein:  50-65 grams  Fluid:  > 1.1 L  EDUCATION NEEDS:   Education needs addressed  Kaycee Haycraft A. Mayford Knife, RD, LDN, CDE Pager: [phone] After hours Pager: [phone]

## 2016-10-28 NOTE — Progress Notes (Signed)
Progress note  Patient admitted early this morning, see H&P. She is admitted after a fall at her assisted living facility. She was found to have symptomatic anemia from baseline hemoglobin of 10. Fecal occult blood positive. She is on Eliquis for atrial fibrillation. Patient was agreeable to transfusion, but did not want any further workup or procedures such as EGD. Thus, GI was not consulted.  Patient received 1 unit packed red blood cell, hemoglobin currently stable Hold Eliquis, may have to hold indefinitely due to age, falls, GI bleeding Holding antihypertensives due to GI bleeding, borderline blood pressure, although currently stable Physical therapy evaluated patient, recommending SNF   Melanie Stain, DO [facility] [email or web address] Password [email or web address] 10/28/2016, 12:07 PM

## 2016-10-28 NOTE — H&P (Signed)
History and Physical    CORIANNA AVALLONE Blake:[id number] DOB: [birth year] DOA: 10/27/2016  PCP: [facility]  Patient coming from: Assisted living.  Chief Complaint: Fall.  HPI: Melanie Blake is a 83 y.o. female with history of permanent atrial fibrillation, diastolic CHF, chronic kidney disease was brought to the ER after patient had a fall at her living facility. Patient had a fall 2 days ago and since then patient has been feeling weak and off recently has been getting dizzy. Patient's Cardizem was held and Lasix dose was decreased due to low normal blood pressure.    ED Course: In the ER patient is found to have hemoglobin around 8 which is a drop from recent of 10 which was also new for the patient. As per the family patient has become anemic which is new. Patient blood pressures in the low normal. Patient also was mildly febrile. Blood cultures were sent along with urine cultures. Chest x-ray does not show any definite infiltrates. Since patient had a fall and has been having some swelling in the right lower extremity x-rays were done which were showing soft tissue swelling. Patient's hemoglobin dropped from recent and stool for occult blood was positive. Patient is on Apixaban for atrial fibrillation. Patient is agreeable to transfusion but does not want any procedures like EGD.  Review of Systems: As per HPI, rest all negative.   Past Medical History:  Diagnosis Date  . Atrial fibrillation (HCC)    dx 10/2012; s/p DCCV => NSR 12/23/2012  . Cataract   . Chronic diastolic heart failure (HCC)    a. Echo 4/14: EF "grossly normal", mild AI, mild MR, PASP 40, mild reduced RVSF    Past Surgical History:  Procedure Laterality Date  . CARDIOVERSION N/A 12/23/2012   Procedure: CARDIOVERSION;  Surgeon: Laurey Morale, MD;  Location: [facility] ENDOSCOPY;  Service: Cardiovascular;  Laterality: N/A;     reports that she has never smoked. She has never used smokeless tobacco. She reports  that she does not drink alcohol or use drugs.  Allergies  Allergen Reactions  . Penicillins Swelling    Has patient had a PCN reaction causing immediate rash, facial/tongue/throat swelling, SOB or lightheadedness with hypotension: Yes Has patient had a PCN reaction causing severe rash involving mucus membranes or skin necrosis: No Has patient had a PCN reaction that required hospitalization unknown Has patient had a PCN reaction occurring within the last 10 years: No If all of the above answers are "NO", then may proceed with Cephalosporin use.  . Sulfa Antibiotics Hives and Swelling    Family History  Problem Relation Age of Onset  . Heart attack Neg Hx     Prior to Admission medications   Medication Sig Start Date End Date Taking? Authorizing Provider  acetaminophen (TYLENOL) 500 MG tablet Take 2 tablets (1,000 mg total) by mouth every 8 (eight) hours as needed. Patient taking differently: Take 1,000 mg by mouth every 8 (eight) hours as needed for headache (pain).  05/23/16  Yes Shanker Levora Dredge, MD  apixaban (ELIQUIS) 5 MG TABS tablet Take 5 mg by mouth daily.   Yes Historical Provider, MD  diazepam (VALIUM) 5 MG tablet Take 5 mg by mouth 2 (two) times daily.   Yes Historical Provider, MD  diltiazem (CARDIZEM CD) 120 MG 24 hr capsule Take 1 capsule (120 mg total) by mouth daily. 05/02/16  Yes Laurey Morale, MD  escitalopram (LEXAPRO) 10 MG tablet Take 10 mg by mouth  at bedtime.    Yes Historical Provider, MD  furosemide (LASIX) 20 MG tablet Take 40 mg by mouth daily.   Yes Historical Provider, MD  HYDROcodone-acetaminophen (NORCO/VICODIN) 5-325 MG tablet Take 1 tablet by mouth every 6 (six) hours as needed for moderate pain.   Yes Historical Provider, MD  levothyroxine (SYNTHROID, LEVOTHROID) 50 MCG tablet Take 50 mcg by mouth daily before breakfast.   Yes Historical Provider, MD  metoprolol succinate (TOPROL-XL) 50 MG 24 hr tablet TAKE 1 TABLET (50 MG TOTAL) BY MOUTH DAILY. TAKE  WITH OR IMMEDIATELY FOLLOWING A MEAL. Patient taking differently: Take 50 mg by mouth daily. Take with or immediately following a meal 05/02/16  Yes Laurey Morale, MD  mirtazapine (REMERON) 15 MG tablet Take 15 mg by mouth at bedtime.   Yes Historical Provider, MD  ondansetron (ZOFRAN) 4 MG tablet Take 4 mg by mouth every 8 (eight) hours as needed for nausea or vomiting.   Yes Historical Provider, MD  potassium chloride (KLOR-CON M10) 10 MEQ tablet Take 1 tablet (10 mEq total) by mouth daily. 05/02/16  Yes Laurey Morale, MD  traMADol (ULTRAM) 50 MG tablet Take 50 mg by mouth every 6 (six) hours. Scheduled: 12a, 6a, 12p, 6p   Yes Historical Provider, MD  apixaban (ELIQUIS) 2.5 MG TABS tablet Take 1 tablet (2.5 mg total) by mouth 2 (two) times daily. Patient not taking: Reported on 10/27/2016 05/23/16   Maretta Bees, MD  furosemide (LASIX) 40 MG tablet TAKE 1 TABLET BY MOUTH EVERY MORNING AND 1/2 TABLET EVERY AFTERNOON AS DIRECTED (WILL PAY 3/1) Patient not taking: Reported on 10/27/2016 05/02/16   Laurey Morale, MD    Physical Exam: Vitals:   10/28/16 0128 10/28/16 0211 10/28/16 0234 10/28/16 0330  BP: (!) 117/53 (!) 105/46 (!) 109/58 (!) 99/41  Pulse: 63 72 69 61  Resp: Temp: 97.5 F (36.4 C) 98.1 F (36.7 C) 97.5 F (36.4 C) 97.4 F (36.3 C)  TempSrc: Axillary Axillary Axillary Axillary  SpO2: 95% 100% 100% 100%  Weight: 48.2 kg (106 lb 4.2 oz)     Height:          Constitutional: Moderately built and nourished. Vitals:   10/28/16 0128 10/28/16 0211 10/28/16 0234 10/28/16 0330  BP: (!) 117/53 (!) 105/46 (!) 109/58 (!) 99/41  Pulse: 63 72 69 61  Resp: Temp: 97.5 F (36.4 C) 98.1 F (36.7 C) 97.5 F (36.4 C) 97.4 F (36.3 C)  TempSrc: Axillary Axillary Axillary Axillary  SpO2: 95% 100% 100% 100%  Weight: 48.2 kg (106 lb 4.2 oz)     Height:       Eyes: Anicteric mild pallor. ENMT: No discharge from the ears eyes nose and mouth. Neck: No  JVD appreciated no mass felt. Respiratory: No rhonchi or crepitations. Cardiovascular: S1 and S2 heard no murmurs appreciated. Abdomen: Soft nontender bowel sounds present. Musculoskeletal: Swelling and ecchymotic areas of the lower extremity. Skin: Multiple doses. Neurologic: Patient was sleeping but arousable and is oriented to name and place. Follows commands and moves all extremities. Psychiatric: Appears normal. Normal affect.   Labs on Admission: I have personally reviewed following labs and imaging studies  CBC:  Recent Labs Lab 10/27/16 1857  WBC 7.8  NEUTROABS 6.2  HGB 8.1*  HCT 27.2*  MCV 81.0  PLT 274   Basic Metabolic Panel:  Recent Labs Lab 10/27/16 1857  NA 141  K 4.0  CL  104  CO2 26  GLUCOSE 107*  BUN 25*  CREATININE 1.46*  CALCIUM 9.0   GFR: Estimated Creatinine Clearance: 18 mL/min (A) (by C-G formula based on SCr of 1.46 mg/dL (H)). Liver Function Tests:  Recent Labs Lab 10/27/16 1857  AST 16  ALT 11*  ALKPHOS 68  BILITOT 0.7  PROT 6.3*  ALBUMIN 3.3*   No results for input(s): LIPASE, AMYLASE in the last 168 hours. No results for input(s): AMMONIA in the last 168 hours. Coagulation Profile: No results for input(s): INR, PROTIME in the last 168 hours. Cardiac Enzymes: No results for input(s): CKTOTAL, CKMB, CKMBINDEX, TROPONINI in the last 168 hours. BNP (last 3 results) No results for input(s): PROBNP in the last 8760 hours. HbA1C: No results for input(s): HGBA1C in the last 72 hours. CBG: No results for input(s): GLUCAP in the last 168 hours. Lipid Profile: No results for input(s): CHOL, HDL, LDLCALC, TRIG, CHOLHDL, LDLDIRECT in the last 72 hours. Thyroid Function Tests: No results for input(s): TSH, T4TOTAL, FREET4, T3FREE, THYROIDAB in the last 72 hours. Anemia Panel: No results for input(s): VITAMINB12, FOLATE, FERRITIN, TIBC, IRON, RETICCTPCT in the last 72 hours. Urine analysis:    Component Value Date/Time   COLORURINE  STRAW (A) 10/27/2016 2027   APPEARANCEUR CLEAR 10/27/2016 2027   LABSPEC 1.010 10/27/2016 2027   PHURINE 5.0 10/27/2016 2027   GLUCOSEU NEGATIVE 10/27/2016 2027   HGBUR SMALL (A) 10/27/2016 2027   BILIRUBINUR NEGATIVE 10/27/2016 2027   KETONESUR NEGATIVE 10/27/2016 2027   PROTEINUR NEGATIVE 10/27/2016 2027   UROBILINOGEN 0.2 08/17/2010 1145   NITRITE NEGATIVE 10/27/2016 2027   LEUKOCYTESUR NEGATIVE 10/27/2016 2027   Sepsis Labs: (procalcitonin:4,lacticidven:4) )No results found for this or any previous visit (from the past 240 hour(s)).   Radiological Exams on Admission: Dg Chest 2 View  Result Date: 10/27/2016 CLINICAL DATA:  Dizziness. EXAM: CHEST  2 VIEW COMPARISON:  06/13/2016 FINDINGS: The heart size is enlarged. Large hiatal hernia is again noted. The lungs appear hyperinflated with chronic interstitial coarsening. No superimposed airspace consolidation identified. Compression fractures within the lower thoracic spine with associated kyphosis deformity noted. IMPRESSION: 1. No acute cardiopulmonary abnormalities. 2. Cardiac enlargement 3. Hiatal hernia 4. Thoracic compression deformities. Electronically Signed   By: Signa Kell M.D.   On: 10/27/2016 20:24   Dg Tibia/fibula Right  Result Date: 10/27/2016 CLINICAL DATA:  Right leg pain after mechanical fall last evening EXAM: RIGHT TIBIA AND FIBULA - 2 VIEW COMPARISON:  None. FINDINGS: There is no evidence of fracture or other focal bone lesions. Joint spaces at the knee and ankle are intact. Popliteal and tibial and branch vessel arteriosclerosis. Mild soft tissue induration about the knee and right leg without focal soft tissue mass or fluid collections. IMPRESSION: Mild subcutaneous soft tissue induration of the right knee and leg. No acute osseous abnormality. Electronically Signed   By: Tollie Eth M.D.   On: 10/27/2016 20:18   Ct Head Wo Contrast  Result Date: 10/27/2016 CLINICAL DATA:  Larey Seat during the night.   Dizziness. EXAM: CT HEAD WITHOUT CONTRAST TECHNIQUE: Contiguous axial images were obtained from the base of the skull through the vertex without intravenous contrast. COMPARISON:  06/13/2016 FINDINGS: Brain: There is no evidence for acute hemorrhage, hydrocephalus, mass lesion, or abnormal extra-axial fluid collection. No definite CT evidence for acute infarction. Diffuse loss of parenchymal volume is consistent with atrophy. Patchy low attenuation in the deep hemispheric and periventricular white matter is nonspecific, but likely reflects chronic microvascular ischemic  demyelination. Lacunar infarct noted in the left basal ganglia. Vascular: No hyperdense vessel or unexpected calcification. Skull: No evidence for fracture. No worrisome lytic or sclerotic lesion. Visualized portions of the globes and intraorbital fat are unremarkable. Sinuses/Orbits: The visualized paranasal sinuses and mastoid air cells are clear. Other: None. IMPRESSION: 1. Stable.  No acute intracranial abnormality. 2. Atrophy with chronic small vessel white matter ischemic disease. Electronically Signed   By: Kennith Center M.D.   On: 10/27/2016 20:11   Dg Knee Complete 4 Views Right  Result Date: 10/27/2016 CLINICAL DATA:  Pain after mechanical fall EXAM: RIGHT KNEE - COMPLETE 4+ VIEW COMPARISON:  None. FINDINGS: Mild joint space narrowing of the femorotibial compartment. Arteriosclerosis noted along the course of the popliteal and tibial arteries and their branches. There is mild soft tissue edema about the right leg. No abnormal soft tissue mass or mineralization. Bones are osteopenic in appearance without acute fracture or malalignment. No bone destruction is seen. Ankle mortise is maintained. IMPRESSION: No acute osseous abnormality of the right tibia and fibula. Mild soft tissue edema of the leg. Mild joint space narrowing of the femorotibial compartment. Electronically Signed   By: Tollie Eth M.D.   On: 10/27/2016 23:44    EKG:  Independently reviewed. A. fib rate controlled.  Assessment/Plan Principal Problem:   Acute blood loss anemia Active Problems:   Atrial fibrillation (HCC)   Chronic diastolic heart failure (HCC)   Stage 3 chronic kidney disease   Gastrointestinal hemorrhage   Acute GI bleeding    1. Acute blood loss anemia from GI bleed - patient does not want any GI procedures done but okay with transfusion for which 1 unit of PRBC has been ordered. Patient's Apixaban will be held and need to consider holding it further given the falls and GI bleed. Recheck hemoglobin after transfusion. 2. Falls probably secondary to low normal blood pressure - holding off Cardizem metoprolol and Lasix. Get physical therapy consult and recheck blood pressure in the morning after transfusion. 3. Permanent atrial fibrillation - rate controlled. Holding off beta blockers and Cardizem and Apixaban due to low normal blood pressure and also due to GI bleed. 4. Chronic diastolic CHF - holding of Lasix due to low normal blood pressure. 5. Chronic kidney disease stage III - creatinine appears to be at baseline. 6. At admission patient was mildly febrile. Follow urine cultures and blood cultures.   DVT prophylaxis: SCDs. Code Status: DO NOT RESUSCITATE.  Family Communication: No family at the bedside.  Disposition Plan: To be determined if patient needs higher level of care.  Consults called: None.  Admission status: Observation.    Eduard Clos MD [facility] Pager [phone].  If 7PM-7AM, please contact night-coverage [email or web address] Password [facility]  10/28/2016, 4:29 AM

## 2016-10-28 NOTE — Discharge Summary (Signed)
Physician Discharge Summary  Melanie Blake ZOX:[id number] DOB: Nov 01, 1923 DOA: 10/27/2016  PCP: [facility]  Admit date: 10/27/2016 Discharge date: 10/28/2016  Admitted From: ALF Disposition:  SNF  Recommendations for Outpatient Follow-up:  1. Follow up with PCP in 1 week 2. Please obtain BMP/CBC in 1 week  3. Please follow up on the following pending results: Final blood culture results, urine culture  Discharge Condition: Stable CODE STATUS: DNR  Diet recommendation: Heart healthy   Brief/Interim Summary: HPI by Dr. Toniann Fail: Melanie Blake is a 83 y.o. female with history of permanent atrial fibrillation, diastolic CHF, chronic kidney disease was brought to the ER after patient had a fall at her living facility. Patient had a fall 2 days ago and since then patient has been feeling weak and off recently has been getting dizzy. Patient's Cardizem was held and Lasix dose was decreased due to low normal blood pressure.    ED Course: In the ER patient is found to have hemoglobin around 8 which is a drop from recent of 10 which was also new for the patient. As per the family patient has become anemic which is new. Patient blood pressures in the low normal. Patient also was mildly febrile. Blood cultures were sent along with urine cultures. Chest x-ray does not show any definite infiltrates. Since patient had a fall and has been having some swelling in the right lower extremity x-rays were done which were showing soft tissue swelling. Patient's hemoglobin dropped from recent and stool for occult blood was positive. Patient is on Apixaban for atrial fibrillation. Patient is agreeable to transfusion but does not want any procedures like EGD.  Interim: Eliquis was held, patient did receive 1 unit packed red blood cells with stabilization of hemoglobin. Physical therapy evaluated patient, recommended skilled nursing facility.  Discharge Diagnoses:  Principal Problem:   Acute  blood loss anemia Active Problems:   Atrial fibrillation (HCC)   Chronic diastolic heart failure (HCC)   Stage 3 chronic kidney disease   Gastrointestinal hemorrhage   Acute GI bleeding   Acute blood loss anemia from GI bleed -Patient does not want any GI procedures done but okay with transfusion for which 1 unit of PRBC has been ordered. Hgb stable. Eliquis is held at discharge.   Falls  -Holding off Cardizem metoprolol and Lasix due to low BP. Resume these medications at primary care physician's discretion  Permanent atrial fibrillation - rate controlled.  -Holding off beta blockers and Cardizem and Apixaban due to low BP and also due to GI bleed. Resume these medications at primary care physician's discretion  Chronic diastolic CHF  -Holding of Lasix due to low normal blood pressure. Resume these medications at primary care physician's discretion  Chronic kidney disease stage III  -Creatinine appears to be at baseline.     Discharge Instructions  Discharge Instructions    Call MD for:  difficulty breathing, headache or visual disturbances    Complete by:  As directed    Call MD for:  extreme fatigue    Complete by:  As directed    Call MD for:  persistant dizziness or light-headedness    Complete by:  As directed    Call MD for:  persistant nausea and vomiting    Complete by:  As directed    Call MD for:  severe uncontrolled pain    Complete by:  As directed    Diet - low sodium heart healthy    Complete by:  As directed    Increase activity slowly    Complete by:  As directed      Allergies as of 10/28/2016      Reactions   Penicillins Swelling   Has patient had a PCN reaction causing immediate rash, facial/tongue/throat swelling, SOB or lightheadedness with hypotension: Yes Has patient had a PCN reaction causing severe rash involving mucus membranes or skin necrosis: No Has patient had a PCN reaction that required hospitalization unknown Has patient had a PCN  reaction occurring within the last 10 years: No If all of the above answers are "NO", then may proceed with Cephalosporin use.   Sulfa Antibiotics Hives, Swelling      Medication List    STOP taking these medications   apixaban 2.5 MG Tabs tablet Commonly known as:  ELIQUIS   diazepam 5 MG tablet Commonly known as:  VALIUM   diltiazem 120 MG 24 hr capsule Commonly known as:  CARDIZEM CD   ELIQUIS 5 MG Tabs tablet Generic drug:  apixaban   furosemide 20 MG tablet Commonly known as:  LASIX   furosemide 40 MG tablet Commonly known as:  LASIX   HYDROcodone-acetaminophen 5-325 MG tablet Commonly known as:  NORCO/VICODIN   metoprolol succinate 50 MG 24 hr tablet Commonly known as:  TOPROL-XL   potassium chloride 10 MEQ tablet Commonly known as:  KLOR-CON M10     TAKE these medications   acetaminophen 500 MG tablet Commonly known as:  TYLENOL Take 2 tablets (1,000 mg total) by mouth every 8 (eight) hours as needed. What changed:  reasons to take this   escitalopram 10 MG tablet Commonly known as:  LEXAPRO Take 10 mg by mouth at bedtime.   levothyroxine 50 MCG tablet Commonly known as:  SYNTHROID, LEVOTHROID Take 50 mcg by mouth daily before breakfast.   mirtazapine 15 MG tablet Commonly known as:  REMERON Take 15 mg by mouth at bedtime.   ondansetron 4 MG tablet Commonly known as:  ZOFRAN Take 4 mg by mouth every 8 (eight) hours as needed for nausea or vomiting.   traMADol 50 MG tablet Commonly known as:  ULTRAM Take 50 mg by mouth every 6 (six) hours. Scheduled: 12a, 6a, 12p, 6p      Follow-up Information    [facility]. Schedule an appointment as soon as possible for a visit in 1 week(s).   Specialty:  Geriatric Medicine Contact information: [address] [place] [address] [phone]          Allergies  Allergen Reactions  . Penicillins Swelling    Has patient had a PCN reaction causing immediate rash,  facial/tongue/throat swelling, SOB or lightheadedness with hypotension: Yes Has patient had a PCN reaction causing severe rash involving mucus membranes or skin necrosis: No Has patient had a PCN reaction that required hospitalization unknown Has patient had a PCN reaction occurring within the last 10 years: No If all of the above answers are "NO", then may proceed with Cephalosporin use.  . Sulfa Antibiotics Hives and Swelling    Consultations:  None   Procedures/Studies: Dg Chest 2 View  Result Date: 10/27/2016 CLINICAL DATA:  Dizziness. EXAM: CHEST  2 VIEW COMPARISON:  06/13/2016 FINDINGS: The heart size is enlarged. Large hiatal hernia is again noted. The lungs appear hyperinflated with chronic interstitial coarsening. No superimposed airspace consolidation identified. Compression fractures within the lower thoracic spine with associated kyphosis deformity noted. IMPRESSION: 1. No acute cardiopulmonary abnormalities. 2. Cardiac enlargement 3.  Hiatal hernia 4. Thoracic compression deformities. Electronically Signed   By: Signa Kell M.D.   On: 10/27/2016 20:24   Dg Tibia/fibula Right  Result Date: 10/27/2016 CLINICAL DATA:  Right leg pain after mechanical fall last evening EXAM: RIGHT TIBIA AND FIBULA - 2 VIEW COMPARISON:  None. FINDINGS: There is no evidence of fracture or other focal bone lesions. Joint spaces at the knee and ankle are intact. Popliteal and tibial and branch vessel arteriosclerosis. Mild soft tissue induration about the knee and right leg without focal soft tissue mass or fluid collections. IMPRESSION: Mild subcutaneous soft tissue induration of the right knee and leg. No acute osseous abnormality. Electronically Signed   By: Tollie Eth M.D.   On: 10/27/2016 20:18   Ct Head Wo Contrast  Result Date: 10/27/2016 CLINICAL DATA:  Larey Seat during the night.  Dizziness. EXAM: CT HEAD WITHOUT CONTRAST TECHNIQUE: Contiguous axial images were obtained from the base of the skull  through the vertex without intravenous contrast. COMPARISON:  06/13/2016 FINDINGS: Brain: There is no evidence for acute hemorrhage, hydrocephalus, mass lesion, or abnormal extra-axial fluid collection. No definite CT evidence for acute infarction. Diffuse loss of parenchymal volume is consistent with atrophy. Patchy low attenuation in the deep hemispheric and periventricular white matter is nonspecific, but likely reflects chronic microvascular ischemic demyelination. Lacunar infarct noted in the left basal ganglia. Vascular: No hyperdense vessel or unexpected calcification. Skull: No evidence for fracture. No worrisome lytic or sclerotic lesion. Visualized portions of the globes and intraorbital fat are unremarkable. Sinuses/Orbits: The visualized paranasal sinuses and mastoid air cells are clear. Other: None. IMPRESSION: 1. Stable.  No acute intracranial abnormality. 2. Atrophy with chronic small vessel white matter ischemic disease. Electronically Signed   By: Kennith Center M.D.   On: 10/27/2016 20:11   Dg Knee Complete 4 Views Right  Result Date: 10/27/2016 CLINICAL DATA:  Pain after mechanical fall EXAM: RIGHT KNEE - COMPLETE 4+ VIEW COMPARISON:  None. FINDINGS: Mild joint space narrowing of the femorotibial compartment. Arteriosclerosis noted along the course of the popliteal and tibial arteries and their branches. There is mild soft tissue edema about the right leg. No abnormal soft tissue mass or mineralization. Bones are osteopenic in appearance without acute fracture or malalignment. No bone destruction is seen. Ankle mortise is maintained. IMPRESSION: No acute osseous abnormality of the right tibia and fibula. Mild soft tissue edema of the leg. Mild joint space narrowing of the femorotibial compartment. Electronically Signed   By: Tollie Eth M.D.   On: 10/27/2016 23:44       Discharge Exam: Vitals:   10/28/16 0429 10/28/16 0533  BP: 134/63 (!) 113/50  Pulse: 73 85  Resp: 16 16  Temp: 98.1  F (36.7 C) 98.4 F (36.9 C)   Vitals:   10/28/16 0330 10/28/16 0429 10/28/16 0533 10/28/16 1253  BP: (!) 99/41 134/63 (!) 113/50   Pulse: 61 73 85   Resp: Temp: 97.4 F (36.3 C) 98.1 F (36.7 C) 98.4 F (36.9 C)   TempSrc: Axillary Axillary Axillary   SpO2: 100% 100% 100% (!) 87%  Weight:      Height:        General: Pt is alert, awake, not in acute distress Cardiovascular: Irregular rhythm Respiratory: CTA bilaterally, no wheezing, no rhonchi Abdominal: Soft, NT, ND, bowel sounds + Extremities: no edema, no cyanosis    The results of significant diagnostics from this hospitalization (including imaging, microbiology, ancillary and laboratory) are listed  below for reference.     Microbiology: Recent Results (from the past 240 hour(s))  Blood Culture (routine x 2)     Status: None (Preliminary result)   Collection Time: 10/27/16  6:48 PM  Result Value Ref Range Status   Specimen Description BLOOD RIGHT HAND  Final   Special Requests   Final    BOTTLES DRAWN AEROBIC ONLY Blood Culture adequate volume   Culture NO GROWTH < 24 HOURS  Final   Report Status PENDING  Incomplete  Blood Culture (routine x 2)     Status: None (Preliminary result)   Collection Time: 10/27/16  8:50 PM  Result Value Ref Range Status   Specimen Description BLOOD RIGHT ARM  Final   Special Requests   Final    BOTTLES DRAWN AEROBIC AND ANAEROBIC Blood Culture adequate volume   Culture NO GROWTH < 12 HOURS  Final   Report Status PENDING  Incomplete  MRSA PCR Screening     Status: None   Collection Time: 10/28/16  1:31 AM  Result Value Ref Range Status   MRSA by PCR NEGATIVE NEGATIVE Final    Comment:        The GeneXpert MRSA Assay (FDA approved for NASAL specimens only), is one component of a comprehensive MRSA colonization surveillance program. It is not intended to diagnose MRSA infection nor to guide or monitor treatment for MRSA infections.      Labs: BNP (last 3  results)  Recent Labs  10/27/16 1843  BNP 427.7*   Basic Metabolic Panel:  Recent Labs Lab 10/27/16 1857 10/28/16 0714  NA 141 143  K 4.0 3.8  CL 104 110  CO2 26 25  GLUCOSE 107* 96  BUN 25* 24*  CREATININE 1.46* 1.46*  CALCIUM 9.0 8.4*   Liver Function Tests:  Recent Labs Lab 10/27/16 1857 10/28/16 0714  AST 16 21  ALT 11* 11*  ALKPHOS 68 60  BILITOT 0.7 2.1*  PROT 6.3* 5.4*  ALBUMIN 3.3* 2.8*   No results for input(s): LIPASE, AMYLASE in the last 168 hours. No results for input(s): AMMONIA in the last 168 hours. CBC:  Recent Labs Lab 10/27/16 1857 10/28/16 0714  WBC 7.8 8.0  NEUTROABS 6.2 7.4  HGB 8.1* 8.8*  HCT 27.2* 28.9*  MCV 81.0 82.8  PLT 274 230   Cardiac Enzymes: No results for input(s): CKTOTAL, CKMB, CKMBINDEX, TROPONINI in the last 168 hours. BNP: Invalid input(s): POCBNP CBG: No results for input(s): GLUCAP in the last 168 hours. D-Dimer No results for input(s): DDIMER in the last 72 hours. Hgb A1c No results for input(s): HGBA1C in the last 72 hours. Lipid Profile No results for input(s): CHOL, HDL, LDLCALC, TRIG, CHOLHDL, LDLDIRECT in the last 72 hours. Thyroid function studies No results for input(s): TSH, T4TOTAL, T3FREE, THYROIDAB in the last 72 hours.  Invalid input(s): FREET3 Anemia work up No results for input(s): VITAMINB12, FOLATE, FERRITIN, TIBC, IRON, RETICCTPCT in the last 72 hours. Urinalysis    Component Value Date/Time   COLORURINE STRAW (A) 10/27/2016 2027   APPEARANCEUR CLEAR 10/27/2016 2027   LABSPEC 1.010 10/27/2016 2027   PHURINE 5.0 10/27/2016 2027   GLUCOSEU NEGATIVE 10/27/2016 2027   HGBUR SMALL (A) 10/27/2016 2027   BILIRUBINUR NEGATIVE 10/27/2016 2027   KETONESUR NEGATIVE 10/27/2016 2027   PROTEINUR NEGATIVE 10/27/2016 2027   UROBILINOGEN 0.2 08/17/2010 1145   NITRITE NEGATIVE 10/27/2016 2027   LEUKOCYTESUR NEGATIVE 10/27/2016 2027   Sepsis Labs Invalid input(s): PROCALCITONIN,  WBC,  LACTICIDVEN Microbiology Recent Results (from the past 240 hour(s))  Blood Culture (routine x 2)     Status: None (Preliminary result)   Collection Time: 10/27/16  6:48 PM  Result Value Ref Range Status   Specimen Description BLOOD RIGHT HAND  Final   Special Requests   Final    BOTTLES DRAWN AEROBIC ONLY Blood Culture adequate volume   Culture NO GROWTH < 24 HOURS  Final   Report Status PENDING  Incomplete  Blood Culture (routine x 2)     Status: None (Preliminary result)   Collection Time: 10/27/16  8:50 PM  Result Value Ref Range Status   Specimen Description BLOOD RIGHT ARM  Final   Special Requests   Final    BOTTLES DRAWN AEROBIC AND ANAEROBIC Blood Culture adequate volume   Culture NO GROWTH < 12 HOURS  Final   Report Status PENDING  Incomplete  MRSA PCR Screening     Status: None   Collection Time: 10/28/16  1:31 AM  Result Value Ref Range Status   MRSA by PCR NEGATIVE NEGATIVE Final    Comment:        The GeneXpert MRSA Assay (FDA approved for NASAL specimens only), is one component of a comprehensive MRSA colonization surveillance program. It is not intended to diagnose MRSA infection nor to guide or monitor treatment for MRSA infections.      Time coordinating discharge: 45 minutes  SIGNED:  Noralee Stain, DO [facility] Pager [phone]  If 7PM-7AM, please contact night-coverage [email or web address] Password [facility] 10/28/2016, 2:33 PM

## 2016-10-28 NOTE — Progress Notes (Signed)
Attempted to call niece Sandford Craze at [phone] and home [phone] to get permission to give blood. Awaiting return call. Charge Nurse aware.

## 2016-10-28 NOTE — Evaluation (Signed)
Physical Therapy Evaluation Patient Details Name: Melanie Blake MRN: [id number] DOB: Dec 07, 1923 Today's Date: 10/28/2016   History of Present Illness  83 yo female with onset of anemia with transfusion for GI bleed, now referred to PT for assessemnt of mobility.  Her PLOF is not known as she cannot tell PT the details.  Fell in ALF and has no acute injuriies, PMHx:  chronic thoracic spine fractures, cardiomegaly, stroke, a-fib, CHF, CKD,   Clinical Impression  Pt is weak, limited explanation of PLOF as she is verbal but not fully able to talk about it.  Has no family in with her and is from an ALF environment and will need to be in close care after this.  If SNF is not possible will need staff in ALF to conffirm that she is going to have one on one help for mobility as needed at least initially.  Follow acutely for strengthening and standing activities, and progress as able to walking short distances.    Follow Up Recommendations SNF    Equipment Recommendations  None recommended by PT    Recommendations for Other Services       Precautions / Restrictions Precautions Precautions: Fall (telemetry) Restrictions Weight Bearing Restrictions: No      Mobility  Bed Mobility Overal bed mobility: Needs Assistance Bed Mobility: Supine to Sit     Supine to sit: Min assist     General bed mobility comments: Safety with mobility needed, has tendency to get caught up in linens  Transfers Overall transfer level: Needs assistance Equipment used: Rolling walker (2 wheeled);1 person hand held assist Transfers: Sit to/from [employer] Sit to Stand: Min assist;Mod assist Stand pivot transfers: Min assist;Mod assist       General transfer comment: variable effort and safety awareness  Ambulation/Gait             General Gait Details: unable to do more than transfer to chair  Stairs            Wheelchair Mobility    Modified Rankin (Stroke Patients  Only)       Balance Overall balance assessment: Needs assistance;History of Falls Sitting-balance support: Feet supported;Bilateral upper extremity supported Sitting balance-Leahy Scale: Fair     Standing balance support: Bilateral upper extremity supported Standing balance-Leahy Scale: Poor                               Pertinent Vitals/Pain Pain Assessment: No/denies pain    Home Living Family/patient expects to be discharged to:: Unsure                 Additional Comments: SNF candidate    Prior Function Level of Independence: Needs assistance   Gait / Transfers Assistance Needed: no information on PLOF at eval  ADL's / Homemaking Assistance Needed: in ALF to receive assistance with cleaning and cooking        Hand Dominance   Dominant Hand: Right    Extremity/Trunk Assessment   Upper Extremity Assessment Upper Extremity Assessment: Generalized weakness    Lower Extremity Assessment Lower Extremity Assessment: Generalized weakness    Cervical / Trunk Assessment Cervical / Trunk Assessment: Kyphotic  Communication   Communication: No difficulties  Cognition Arousal/Alertness: Awake/alert Behavior During Therapy: Flat affect;Impulsive Overall Cognitive Status: History of cognitive impairments - at baseline  General Comments General comments (skin integrity, edema, etc.): Pt is demonstrating some differences in mobility from PLOF likely but do not have complete information to go by for determining this    Exercises     Assessment/Plan    PT Assessment Patient needs continued PT services  PT Problem List Decreased strength;Decreased range of motion;Decreased activity tolerance;Decreased balance;Decreased mobility;Decreased coordination;Decreased cognition;Decreased knowledge of use of DME;Decreased safety awareness;Cardiopulmonary status limiting activity       PT Treatment  Interventions DME instruction;Gait training;Functional mobility training;Therapeutic activities;Therapeutic exercise;Balance training;Neuromuscular re-education;Patient/family education    PT Goals (Current goals can be found in the Care Plan section)  Acute Rehab PT Goals Patient Stated Goal: none stated PT Goal Formulation: With patient Time For Goal Achievement: 11/11/16 Potential to Achieve Goals: Fair    Frequency Min 3X/week   Barriers to discharge Other (comment) (not sure if ALF is staffed for one to one care with hands on) ALF will need to assist her with all mobility at least initially    Co-evaluation               End of Session Equipment Utilized During Treatment: Gait belt Activity Tolerance: Patient tolerated treatment well;Treatment limited secondary to medical complications (Comment) (O2 sats down to 72% initially with transfer on 2L O2,) Patient left: in chair;with call bell/phone within reach;with chair alarm set;with nursing/sitter in room (O2 sat settled at 88% and CNA replied she would check later) Nurse Communication: Mobility status;Other (comment) (O2 sats) PT Visit Diagnosis: Unsteadiness on feet (R26.81);Repeated falls (R29.6)    Time: 8119-1478 PT Time Calculation (min) (ACUTE ONLY): 24 min   Charges:   PT Evaluation $PT Eval Moderate Complexity: 1 Procedure PT Treatments $Therapeutic Activity: 8-22 mins   PT G Codes:   PT G-Codes **NOT FOR INPATIENT CLASS** Functional Assessment Tool Used: AM-PAC 6 Clicks Basic Mobility;Clinical judgement Functional Limitation: Mobility: Walking and moving around Mobility: Walking and Moving Around Current Status (G9562): At least 40 percent but less than 60 percent impaired, limited or restricted Mobility: Walking and Moving Around Goal Status [phone]): At least 1 percent but less than 20 percent impaired, limited or restricted    Ivar Drape 10/28/2016, 11:41 AM   Samul Dada, PT MS Acute Rehab Dept.  Number: [facility] [id number] and [facility] [phone]

## 2016-10-28 NOTE — Discharge Instructions (Signed)
You were cared for by a hospitalist during your hospital stay. If you have any questions about your discharge medications or the care you received while you were in the hospital after you are discharged, you can call the unit and asked to speak with the hospitalist on call if the hospitalist that took care of you is not available. Once you are discharged, your primary care physician will handle any further medical issues. Please note that NO REFILLS for any discharge medications will be authorized once you are discharged, as it is imperative that you return to your primary care physician (or establish a relationship with a primary care physician if you do not have one) for your aftercare needs so that they can reassess your need for medications and monitor your lab values. ° °

## 2016-10-28 NOTE — Clinical Social Work Placement (Signed)
   CLINICAL SOCIAL WORK PLACEMENT  NOTE  Date:  10/28/2016  Patient Details  Name: Melanie Blake MRN: [id number] Date of Birth: 1924-05-29  Clinical Social Work is seeking post-discharge placement for this patient at the Skilled  Nursing Facility level of care (*CSW will initial, date and re-position this form in  chart as items are completed):  Yes   Patient/family provided with [place] Clinical Social Work Department's list of facilities offering this level of care within the geographic area requested by the patient (or if unable, by the patient's family).  Yes   Patient/family informed of their freedom to choose among providers that offer the needed level of care, that participate in Medicare, Medicaid or managed care program needed by the patient, have an available bed and are willing to accept the patient.  Yes   Patient/family informed of [facility]'s ownership interest in [facility] and [facility], as well as of the fact that they are under no obligation to receive care at these facilities.  PASRR submitted to EDS on       PASRR number received on       Existing PASRR number confirmed on       FL2 transmitted to all facilities in geographic area requested by pt/family on       FL2 transmitted to all facilities within larger geographic area on       Patient informed that his/her managed care company has contracts with or will negotiate with certain facilities, including the following:        Yes   Patient/family informed of bed offers received.  Patient chooses bed at       Physician recommends and patient chooses bed at      Patient to be transferred to   on  .  Patient to be transferred to facility by       Patient family notified on 10/28/16 of transfer.  Name of family member notified:  Sandford Craze     PHYSICIAN       Additional Comment:    _______________________________________________ Althea Charon, LCSW 10/28/2016, 1:32 PM

## 2016-10-28 NOTE — Progress Notes (Signed)
Got hold Of [facility] and [place] and she gave me and Nancee Liter. Telephone consent for patient to receive blood.

## 2016-10-28 NOTE — Progress Notes (Signed)
Clinical Social Worker facilitated patient discharge including contacting patient family and facility to confirm patient discharge plans.  Clinical information faxed to facility and family agreeable with plan.  CSW arranged ambulance transport via [facility] to [facility] .  RN Tiffany to call [phone] (pt will be placed in rm# [id number]) for report prior to discharge.  Clinical Social Worker will sign off for now as social work intervention is no longer needed. Please consult [place] again if new need arises.  Marrianne Mood, MSW, [employer] [phone]

## 2016-10-28 NOTE — Progress Notes (Signed)
Text paged Dr Toniann Fail to address Code Status. Call return and he will address when he see patient

## 2016-10-29 LAB — TYPE AND SCREEN
ABO/RH(D): A POS
ANTIBODY SCREEN: NEGATIVE
UNIT DIVISION: 0

## 2016-10-29 LAB — BPAM RBC
Blood Product Expiration Date: 201804202359
ISSUE DATE / TIME: 201804100202
UNIT TYPE AND RH: 6200

## 2016-10-29 LAB — URINE CULTURE

## 2016-11-01 LAB — CULTURE, BLOOD (ROUTINE X 2)
Culture: NO GROWTH
Culture: NO GROWTH
SPECIAL REQUESTS: ADEQUATE
Special Requests: ADEQUATE

## 2016-11-13 IMAGING — MR MR LUMBAR SPINE W/O CM
4 of 6 series · 19 of 48 positions shown · non-contrast
Comparison: Lumbar spine radiograph 05/21/2016

CLINICAL DATA: Generalized weakness for 2 weeks.  Low back pain.

EXAM:
MRI LUMBAR SPINE WITHOUT CONTRAST
TECHNIQUE: Multiplanar, multisequence MR imaging of the lumbar spine was
performed. No intravenous contrast was administered.

[Series 3: T2 · sagittal · 4.0mm · 0.51mm/px · 6 of 15 slices shown (1 of 2)]
[im 1/15]
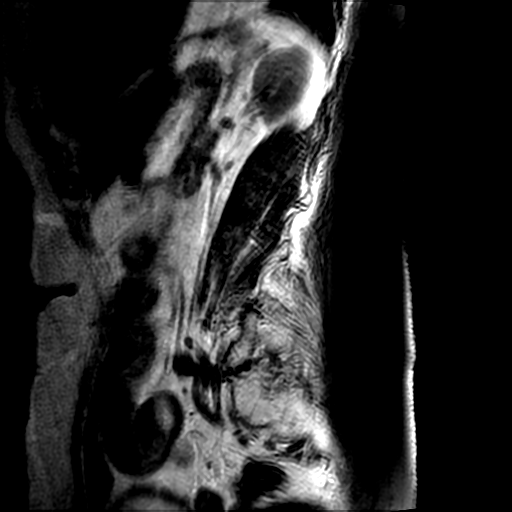
[im 3/15]
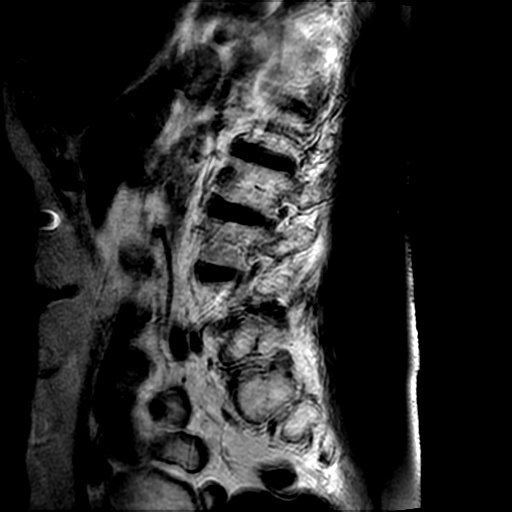
[im 6/15]
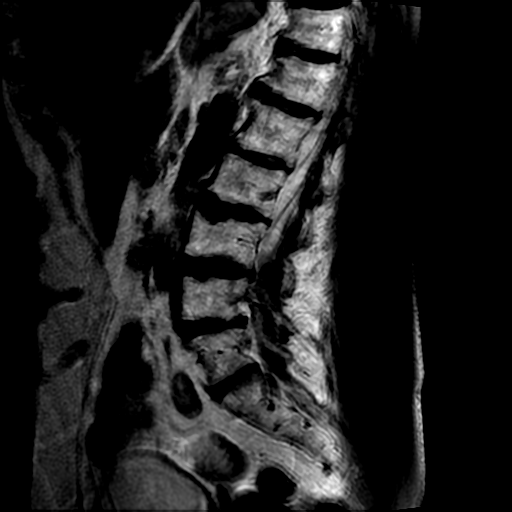
[im 9/15]
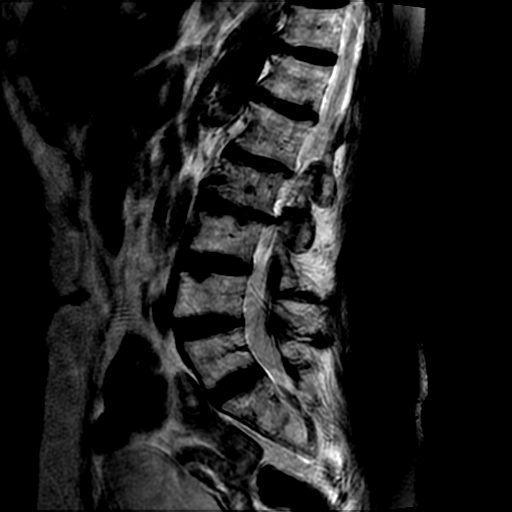
[im 12/15]
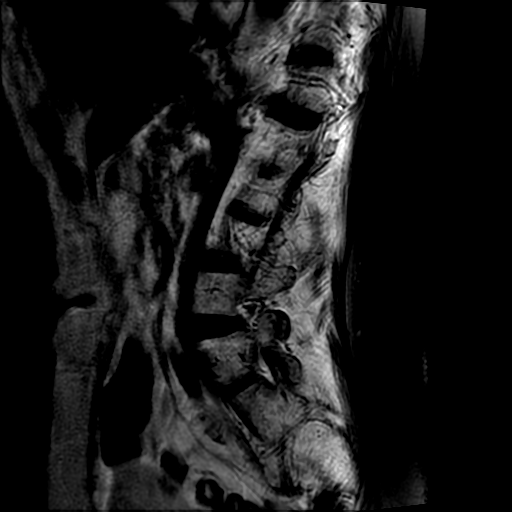
[im 15/15]
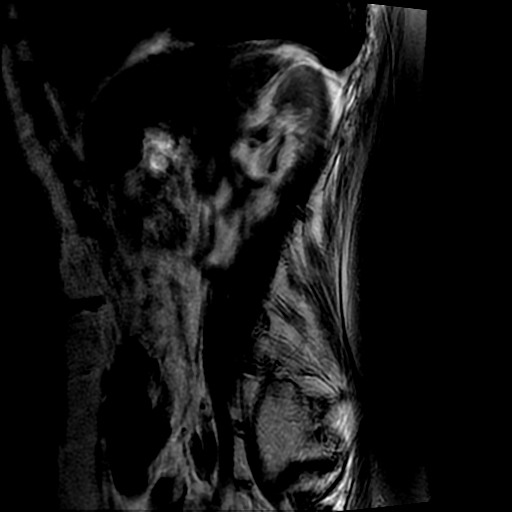

[Series 5: T1 · sagittal · 4.0mm · 0.51mm/px · 3 of 15 slices shown (1 of 2)]
[im 3/15]
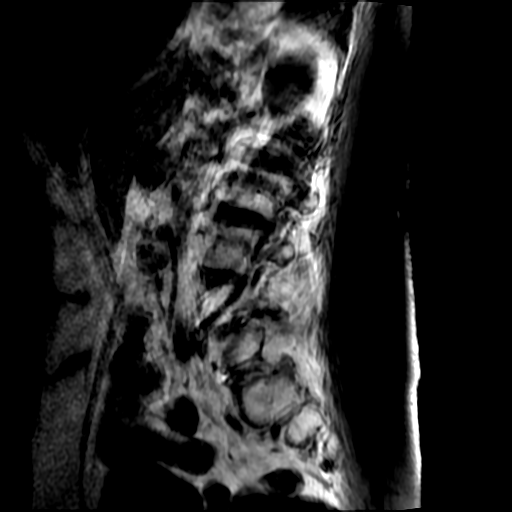
[im 9/15]
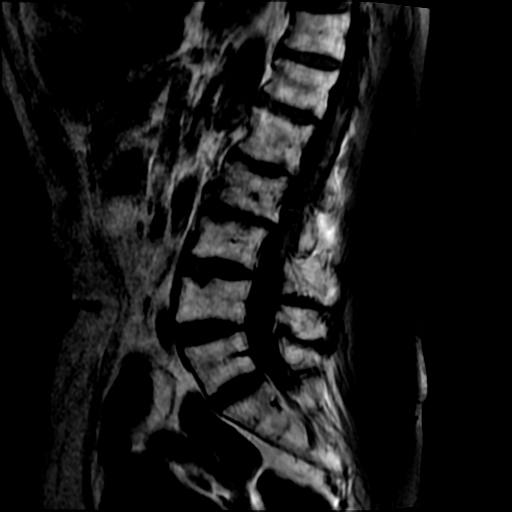
[im 15/15]
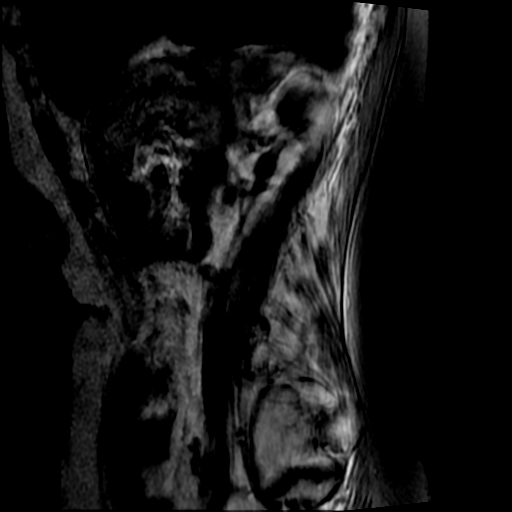

[Series 8: T2 · axial · 4.0mm · 0.39mm/px · z∈[+71,+207]mm · 7 of 28 slices shown (2 of 2)]
[im 1/28]
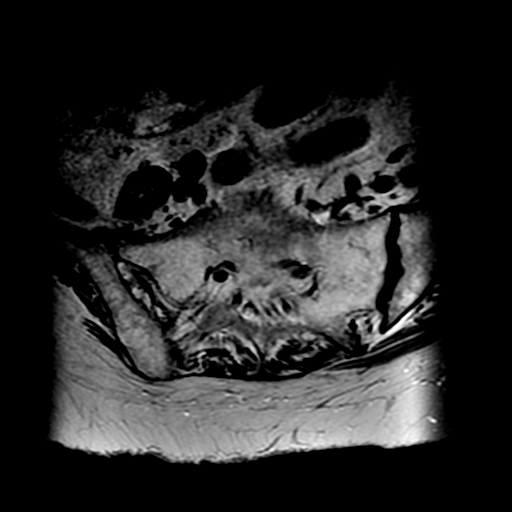
[im 5/28]
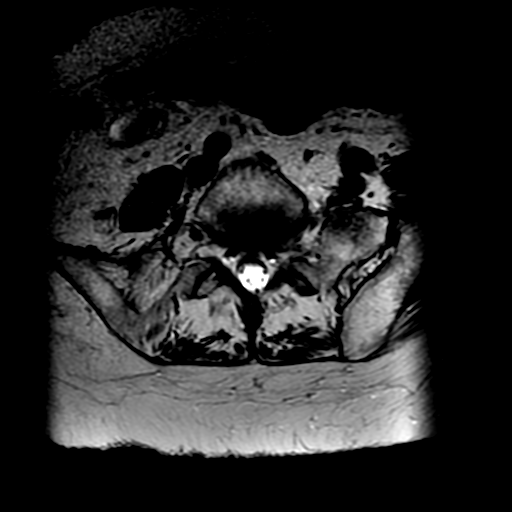
[im 8/28]
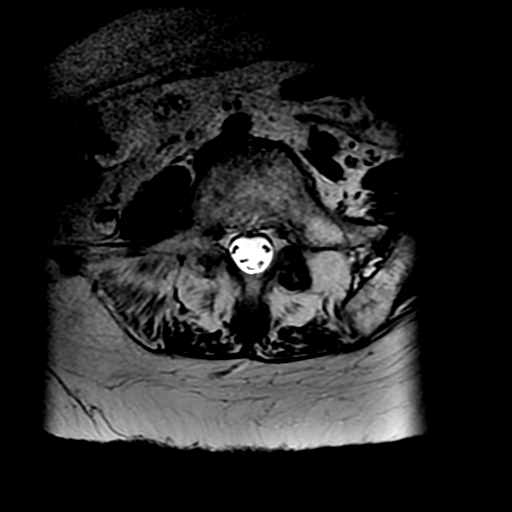
[im 13/28]
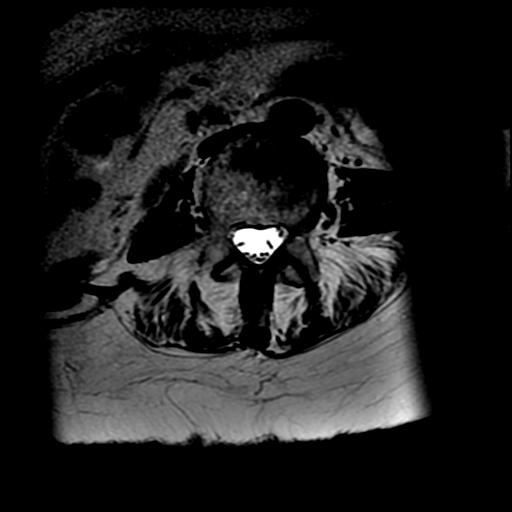
[im 15/28]
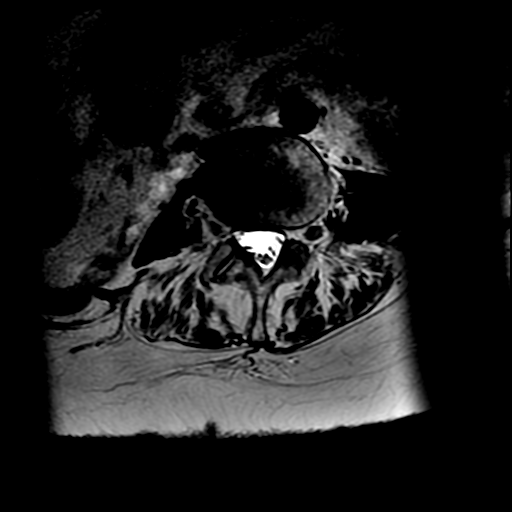
[im 20/28]
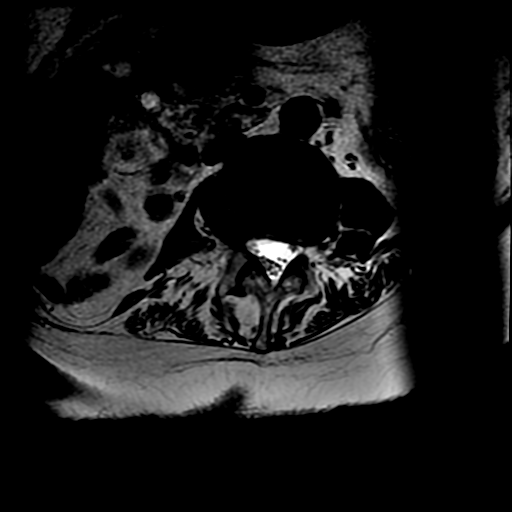
[im 25/28]
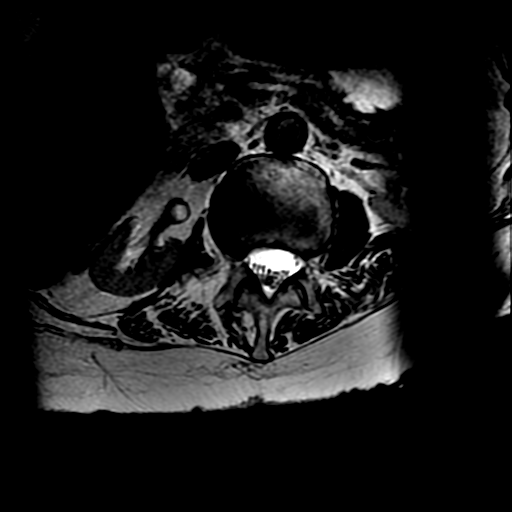

[Series 11: T1 · axial · 4.0mm · 0.39mm/px · z∈[+90,+207]mm · 3 of 28 slices shown (2 of 2)]
[im 5/28]
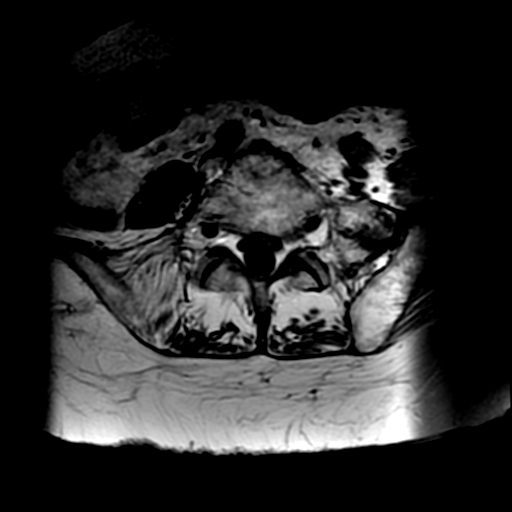
[im 15/28]
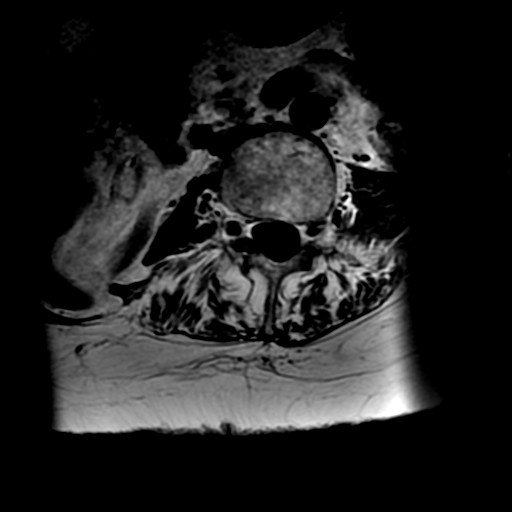
[im 25/28]
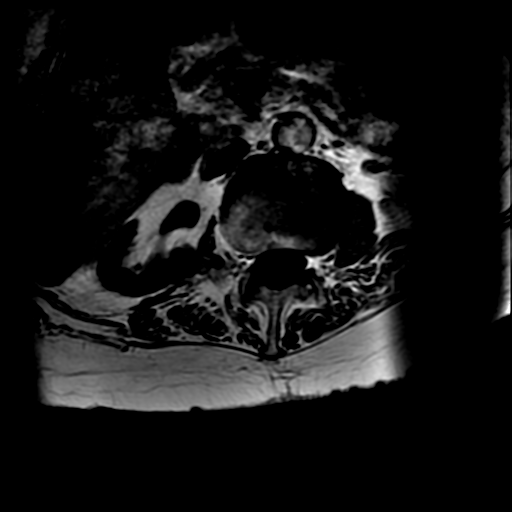

[19 of 48 positions shown; findings below may reference images not displayed]

FINDINGS: Segmentation: Normal. The lowest disc space is considered to be
L5-S1.

Alignment: Possible trace retrolisthesis at L2-L3. There is left
convex lumbar scoliosis with apex at the L2-L3 level.

Vertebrae: No acute compression fracture, facet edema or focal
marrow lesion.

Conus medullaris: Extends to the L2 level and appears normal.

Paraspinal and other soft tissues: The visualized aorta, IVC and
iliac vessels are normal. The visualized retroperitoneal organs and
paraspinal soft tissues are normal.

Disc levels:

T12-L1: Minimal disc herniation. No spinal canal or neural foraminal
stenosis.

L1-L2: Narrowing of the intervertebral disc space without
significant bulge. No spinal canal stenosis. Mild right
neuroforaminal stenosis.

L2-L3: Narrowing of the intervertebral disc space with endplate
osteophytosis, greater on the right. No significant disc bulge.
Normal facets. No spinal canal stenosis. Moderate right
neuroforaminal stenosis.

L3-L4: Normal disc space and facet joints. No spinal canal stenosis.
No neuroforaminal stenosis.

L4-L5: There is moderate left facet hypertrophy. Small disc bulge.
No spinal canal stenosis. Moderate to severe left neuroforaminal
stenosis.

L5-S1: Minimal disc bulge. Mild facet hypertrophy. No spinal canal
stenosis. No neuroforaminal stenosis.
IMPRESSION: 1. Moderate to severe left neural foraminal stenosis at L4-L5,
primarily due to left facet overgrowth.
2. Multilevel degenerative disc dizziness without spinal canal
stenosis.
3. Moderate right L2-L3 foraminal stenosis due to combination of
scoliosis and endplate osteophytosis.

## 2016-11-19 ENCOUNTER — Emergency Department (HOSPITAL_COMMUNITY): Payer: Medicare Other

## 2016-11-19 ENCOUNTER — Encounter (HOSPITAL_COMMUNITY): Payer: Self-pay | Admitting: Emergency Medicine

## 2016-11-19 ENCOUNTER — Inpatient Hospital Stay (HOSPITAL_COMMUNITY)
Admission: EM | Admit: 2016-11-19 | Discharge: 2016-11-24 | DRG: 193 | Disposition: A | Discharge status: Hospice | Payer: Medicare Other | Attending: Internal Medicine | Admitting: Internal Medicine

## 2016-11-19 DIAGNOSIS — M7989 Other specified soft tissue disorders: Secondary | ICD-10-CM | POA: Diagnosis not present

## 2016-11-19 DIAGNOSIS — S7002XD Contusion of left hip, subsequent encounter: Secondary | ICD-10-CM | POA: Diagnosis not present

## 2016-11-19 DIAGNOSIS — Z880 Allergy status to penicillin: Secondary | ICD-10-CM | POA: Diagnosis not present

## 2016-11-19 DIAGNOSIS — I959 Hypotension, unspecified: Secondary | ICD-10-CM | POA: Diagnosis present

## 2016-11-19 DIAGNOSIS — Z9181 History of falling: Secondary | ICD-10-CM | POA: Diagnosis not present

## 2016-11-19 DIAGNOSIS — E860 Dehydration: Secondary | ICD-10-CM | POA: Diagnosis present

## 2016-11-19 DIAGNOSIS — R1310 Dysphagia, unspecified: Secondary | ICD-10-CM | POA: Diagnosis present

## 2016-11-19 DIAGNOSIS — I5032 Chronic diastolic (congestive) heart failure: Secondary | ICD-10-CM | POA: Diagnosis present

## 2016-11-19 DIAGNOSIS — G9340 Encephalopathy, unspecified: Secondary | ICD-10-CM

## 2016-11-19 DIAGNOSIS — I82422 Acute embolism and thrombosis of left iliac vein: Secondary | ICD-10-CM | POA: Diagnosis not present

## 2016-11-19 DIAGNOSIS — Z79899 Other long term (current) drug therapy: Secondary | ICD-10-CM | POA: Diagnosis not present

## 2016-11-19 DIAGNOSIS — N183 Chronic kidney disease, stage 3 unspecified: Secondary | ICD-10-CM | POA: Diagnosis present

## 2016-11-19 DIAGNOSIS — I82412 Acute embolism and thrombosis of left femoral vein: Secondary | ICD-10-CM | POA: Diagnosis not present

## 2016-11-19 DIAGNOSIS — Y95 Nosocomial condition: Secondary | ICD-10-CM | POA: Diagnosis present

## 2016-11-19 DIAGNOSIS — E875 Hyperkalemia: Secondary | ICD-10-CM | POA: Diagnosis present

## 2016-11-19 DIAGNOSIS — R935 Abnormal findings on diagnostic imaging of other abdominal regions, including retroperitoneum: Secondary | ICD-10-CM | POA: Diagnosis not present

## 2016-11-19 DIAGNOSIS — I824Y2 Acute embolism and thrombosis of unspecified deep veins of left proximal lower extremity: Secondary | ICD-10-CM | POA: Diagnosis not present

## 2016-11-19 DIAGNOSIS — R1084 Generalized abdominal pain: Secondary | ICD-10-CM

## 2016-11-19 DIAGNOSIS — E039 Hypothyroidism, unspecified: Secondary | ICD-10-CM | POA: Diagnosis present

## 2016-11-19 DIAGNOSIS — Z882 Allergy status to sulfonamides status: Secondary | ICD-10-CM

## 2016-11-19 DIAGNOSIS — F329 Major depressive disorder, single episode, unspecified: Secondary | ICD-10-CM | POA: Diagnosis present

## 2016-11-19 DIAGNOSIS — R10819 Abdominal tenderness, unspecified site: Secondary | ICD-10-CM | POA: Diagnosis present

## 2016-11-19 DIAGNOSIS — Z66 Do not resuscitate: Secondary | ICD-10-CM | POA: Diagnosis present

## 2016-11-19 DIAGNOSIS — R109 Unspecified abdominal pain: Secondary | ICD-10-CM

## 2016-11-19 DIAGNOSIS — W19XXXD Unspecified fall, subsequent encounter: Secondary | ICD-10-CM | POA: Diagnosis present

## 2016-11-19 DIAGNOSIS — S301XXD Contusion of abdominal wall, subsequent encounter: Secondary | ICD-10-CM

## 2016-11-19 DIAGNOSIS — I482 Chronic atrial fibrillation: Secondary | ICD-10-CM | POA: Diagnosis present

## 2016-11-19 DIAGNOSIS — J189 Pneumonia, unspecified organism: Secondary | ICD-10-CM | POA: Diagnosis present

## 2016-11-19 DIAGNOSIS — F32A Depression, unspecified: Secondary | ICD-10-CM | POA: Diagnosis present

## 2016-11-19 DIAGNOSIS — I4891 Unspecified atrial fibrillation: Secondary | ICD-10-CM | POA: Diagnosis not present

## 2016-11-19 DIAGNOSIS — Z515 Encounter for palliative care: Secondary | ICD-10-CM | POA: Diagnosis present

## 2016-11-19 DIAGNOSIS — S8002XD Contusion of left knee, subsequent encounter: Secondary | ICD-10-CM

## 2016-11-19 DIAGNOSIS — F0390 Unspecified dementia without behavioral disturbance: Secondary | ICD-10-CM | POA: Diagnosis present

## 2016-11-19 DIAGNOSIS — I82409 Acute embolism and thrombosis of unspecified deep veins of unspecified lower extremity: Secondary | ICD-10-CM

## 2016-11-19 DIAGNOSIS — I82492 Acute embolism and thrombosis of other specified deep vein of left lower extremity: Secondary | ICD-10-CM | POA: Diagnosis not present

## 2016-11-19 LAB — COMPREHENSIVE METABOLIC PANEL
ALT: 12 U/L — AB (ref 14–54)
ANION GAP: 13 (ref 5–15)
AST: 27 U/L (ref 15–41)
Albumin: 3 g/dL — ABNORMAL LOW (ref 3.5–5.0)
Alkaline Phosphatase: 109 U/L (ref 38–126)
BUN: 19 mg/dL (ref 6–20)
CHLORIDE: 99 mmol/L — AB (ref 101–111)
CO2: 23 mmol/L (ref 22–32)
CREATININE: 1.67 mg/dL — AB (ref 0.44–1.00)
Calcium: 9.2 mg/dL (ref 8.9–10.3)
GFR calc non Af Amer: 26 mL/min — ABNORMAL LOW (ref >60)
GFR, EST AFRICAN AMERICAN: 30 mL/min — AB (ref >60)
Glucose, Bld: 102 mg/dL — ABNORMAL HIGH (ref 65–99)
POTASSIUM: 5.6 mmol/L — AB (ref 3.5–5.1)
SODIUM: 135 mmol/L (ref 135–145)
Total Bilirubin: 1 mg/dL (ref 0.3–1.2)
Total Protein: 6.9 g/dL (ref 6.5–8.1)

## 2016-11-19 LAB — CBC WITH DIFFERENTIAL/PLATELET
Basophils Absolute: 0 10*3/uL (ref 0.0–0.1)
Basophils Relative: 0 %
EOS ABS: 0 10*3/uL (ref 0.0–0.7)
EOS PCT: 0 %
HCT: 29.2 % — ABNORMAL LOW (ref 36.0–46.0)
Hemoglobin: 8.6 g/dL — ABNORMAL LOW (ref 12.0–15.0)
LYMPHS ABS: 0.8 10*3/uL (ref 0.7–4.0)
Lymphocytes Relative: 6 %
MCH: 24.1 pg — AB (ref 26.0–34.0)
MCHC: 29.5 g/dL — ABNORMAL LOW (ref 30.0–36.0)
MCV: 81.8 fL (ref 78.0–100.0)
Monocytes Absolute: 0.7 10*3/uL (ref 0.1–1.0)
Monocytes Relative: 5 %
Neutro Abs: 12 10*3/uL — ABNORMAL HIGH (ref 1.7–7.7)
Neutrophils Relative %: 89 %
PLATELETS: 324 10*3/uL (ref 150–400)
RBC: 3.57 MIL/uL — AB (ref 3.87–5.11)
RDW: 16 % — AB (ref 11.5–15.5)
WBC: 13.6 10*3/uL — AB (ref 4.0–10.5)

## 2016-11-19 LAB — URINALYSIS, ROUTINE W REFLEX MICROSCOPIC
BACTERIA UA: NONE SEEN
Bilirubin Urine: NEGATIVE
Glucose, UA: NEGATIVE mg/dL
Ketones, ur: NEGATIVE mg/dL
Leukocytes, UA: NEGATIVE
Nitrite: NEGATIVE
PH: 5 (ref 5.0–8.0)
Protein, ur: 30 mg/dL — AB
SPECIFIC GRAVITY, URINE: 1.017 (ref 1.005–1.030)

## 2016-11-19 LAB — I-STAT TROPONIN, ED: Troponin i, poc: 0.01 ng/mL (ref 0.00–0.08)

## 2016-11-19 LAB — PROTIME-INR
INR: 1.12
PROTHROMBIN TIME: 14.4 s (ref 11.4–15.2)

## 2016-11-19 LAB — APTT: APTT: 24 s (ref 24–36)

## 2016-11-19 LAB — TSH: TSH: 1.907 u[IU]/mL (ref 0.350–4.500)

## 2016-11-19 LAB — BRAIN NATRIURETIC PEPTIDE: B Natriuretic Peptide: 154.8 pg/mL — ABNORMAL HIGH (ref 0.0–100.0)

## 2016-11-19 LAB — I-STAT CG4 LACTIC ACID, ED: Lactic Acid, Venous: 1.18 mmol/L (ref 0.5–1.9)

## 2016-11-19 MED ORDER — ONDANSETRON HCL 4 MG/2ML IJ SOLN: 4.0000 mg | Freq: Three times a day (TID) | INTRAMUSCULAR | Status: DC | PRN

## 2016-11-19 MED ORDER — LORAZEPAM 2 MG/ML IJ SOLN
0.5000 mg | Freq: Once | INTRAMUSCULAR | Status: AC
  Administered 2016-11-19: 0.5 mg via INTRAVENOUS
  Filled 2016-11-19: qty 1

## 2016-11-19 MED ORDER — SODIUM CHLORIDE 0.9 % IV BOLUS (SEPSIS)
500.0000 mL | Freq: Once | INTRAVENOUS | Status: AC
  Administered 2016-11-19: 500 mL via INTRAVENOUS

## 2016-11-19 MED ORDER — SODIUM POLYSTYRENE SULFONATE 15 GM/60ML PO SUSP
30.0000 g | Freq: Once | ORAL | Status: AC
  Administered 2016-11-20: 30 g via RECTAL
  Filled 2016-11-19: qty 120

## 2016-11-19 MED ORDER — ACETAMINOPHEN 650 MG RE SUPP: 650.0000 mg | Freq: Four times a day (QID) | RECTAL | Status: DC | PRN

## 2016-11-19 MED ORDER — VANCOMYCIN HCL IN DEXTROSE 1-5 GM/200ML-% IV SOLN
1000.0000 mg | INTRAVENOUS | Status: AC
  Administered 2016-11-20: 1000 mg via INTRAVENOUS
  Filled 2016-11-19: qty 200

## 2016-11-19 MED ORDER — DILTIAZEM HCL-DEXTROSE 100-5 MG/100ML-% IV SOLN (PREMIX)
5.0000 mg/h | Freq: Once | INTRAVENOUS | Status: AC
  Administered 2016-11-19: 5 mg/h via INTRAVENOUS
  Filled 2016-11-19: qty 100

## 2016-11-19 MED ORDER — DEXTROSE 5 % IV SOLN
1.0000 g | Freq: Three times a day (TID) | INTRAVENOUS | Status: DC
  Filled 2016-11-19: qty 1

## 2016-11-19 MED ORDER — DILTIAZEM HCL-DEXTROSE 100-5 MG/100ML-% IV SOLN (PREMIX)
5.0000 mg/h | INTRAVENOUS | Status: DC
  Administered 2016-11-20 – 2016-11-22 (×3): 5 mg/h via INTRAVENOUS
  Filled 2016-11-19 (×4): qty 100

## 2016-11-19 MED ORDER — ALBUTEROL SULFATE (2.5 MG/3ML) 0.083% IN NEBU: 2.5000 mg | INHALATION_SOLUTION | RESPIRATORY_TRACT | Status: DC | PRN

## 2016-11-19 MED ORDER — LEVOFLOXACIN IN D5W 500 MG/100ML IV SOLN
500.0000 mg | Freq: Once | INTRAVENOUS | Status: AC
  Administered 2016-11-19: 500 mg via INTRAVENOUS
  Filled 2016-11-19: qty 100

## 2016-11-19 MED ORDER — DEXTROSE 5 % IV SOLN
1.0000 g | Freq: Three times a day (TID) | INTRAVENOUS | Status: DC
  Administered 2016-11-20 (×2): 1 g via INTRAVENOUS
  Filled 2016-11-19 (×3): qty 1

## 2016-11-19 MED ORDER — IOPAMIDOL (ISOVUE-300) INJECTION 61%
INTRAVENOUS | Status: AC
  Filled 2016-11-19: qty 30

## 2016-11-19 MED ORDER — LEVOTHYROXINE SODIUM 100 MCG IV SOLR
25.0000 ug | Freq: Every day | INTRAVENOUS | Status: DC
  Administered 2016-11-20 – 2016-11-22 (×3): 25 ug via INTRAVENOUS
  Filled 2016-11-19 (×3): qty 5

## 2016-11-19 NOTE — ED Notes (Signed)
NIU at bedside

## 2016-11-19 NOTE — ED Notes (Signed)
PT niece and POA at the bedside. Pt niece says the pt was just moved from rehab to new snf yesterday, she noticed she was somewhat confused last night, but today, she was very confused. States she did fall 3 weeks ago.

## 2016-11-19 NOTE — ED Provider Notes (Signed)
[facility] Note   CSN: [id number] Arrival date & time: 11/19/16  1832     History   Chief Complaint Chief Complaint  Patient presents with  . Atrial Fibrillation    HPI Melanie Blake is a 83 y.o. female.  HPI Level V caveat due to dementia. Sent from nursing home for increased confusion. Negative to be in atrial fibrillation with RVR by EMS. Blood pressure remained stable. Past Medical History:  Diagnosis Date  . Atrial fibrillation (HCC)    dx 10/2012; s/p DCCV => NSR 12/23/2012  . Cataract   . Chronic diastolic heart failure (HCC)    a. Echo 4/14: EF "grossly normal", mild AI, mild MR, PASP 40, mild reduced RVSF    Patient Active Problem List   Diagnosis Date Noted  . Dvt femoral (deep venous thrombosis) (HCC): Common femeral vein and throughout LLE 11/21/2016  . Abnormal CT of the abdomen   . Abdominal tenderness 11/19/2016  . Hyperkalemia 11/19/2016  . Acute encephalopathy 11/19/2016  . Depression 11/19/2016  . HCAP (healthcare-associated pneumonia) 11/19/2016  . Abdominal pain   . Left leg swelling   . Gastrointestinal hemorrhage 10/28/2016  . Acute GI bleeding 10/28/2016  . Acute blood loss anemia 10/27/2016  . UTI (urinary tract infection) 05/21/2016  . Generalized weakness 05/21/2016  . Altered mental status 05/21/2016  . Stage 3 chronic kidney disease 11/13/2013  . Atrial fibrillation (HCC) 11/11/2012  . Chronic diastolic heart failure (HCC) 11/11/2012  . Atrial fibrillation with RVR (HCC) 10/28/2012  . Chronic diastolic CHF (congestive heart failure) (HCC) 10/28/2012  . Elevated TSH 10/28/2012    Past Surgical History:  Procedure Laterality Date  . CARDIOVERSION N/A 12/23/2012   Procedure: CARDIOVERSION;  Surgeon: Laurey Morale, MD;  Location: [facility] ENDOSCOPY;  Service: Cardiovascular;  Laterality: N/A;    OB History    No data available       Home Medications    Prior to Admission medications   Medication Sig Start  Date End Date Taking? Authorizing Provider  acetaminophen (TYLENOL) 500 MG tablet Take 2 tablets (1,000 mg total) by mouth every 8 (eight) hours as needed. Patient taking differently: Take 1,000 mg by mouth every 8 (eight) hours as needed for headache (pain).  05/23/16  Yes Shanker Levora Dredge, MD  escitalopram (LEXAPRO) 10 MG tablet Take 10 mg by mouth at bedtime.    Yes Historical Provider, MD  levothyroxine (SYNTHROID, LEVOTHROID) 50 MCG tablet Take 50 mcg by mouth daily before breakfast.   Yes Historical Provider, MD  mirtazapine (REMERON) 15 MG tablet Take 15 mg by mouth at bedtime.   Yes Historical Provider, MD  ondansetron (ZOFRAN) 4 MG tablet Take 4 mg by mouth every 8 (eight) hours as needed for nausea or vomiting.   Yes Historical Provider, MD  traMADol (ULTRAM) 50 MG tablet Take 50 mg by mouth every 6 (six) hours. Scheduled: 12a, 6a, 12p, 6p   Yes Historical Provider, MD    Family History Family History  Problem Relation Age of Onset  . Heart attack Neg Hx     Social History Social History  Substance Use Topics  . Smoking status: Never Smoker  . Smokeless tobacco: Never Used  . Alcohol use No     Allergies   Penicillins and Sulfa antibiotics   Review of Systems Review of Systems  Unable to perform ROS: Dementia     Physical Exam Updated Vital Signs BP 139/79 (BP Location: Left Arm)   Pulse Marland Kitchen)  114   Temp 98.6 F (37 C) (Oral)   Resp (!) 22   Ht 5' (1.524 m) Comment: pt. unaware of height, height from previous admission  Wt 116 lb 9.6 oz (52.9 kg)   SpO2 97%   BMI 22.77 kg/m   Physical Exam  Constitutional: She is oriented to person, place, and time. She appears well-developed and well-nourished.  Mildly agitated  HENT:  Head: Normocephalic and atraumatic.  Mouth/Throat: Oropharynx is clear and moist. No oropharyngeal exudate.  Eyes: EOM are normal. Pupils are equal, round, and reactive to light.  Neck: Normal range of motion. Neck supple.  No  meningismus. No posterior midline cervical tenderness to palpation.  Cardiovascular:  Tachycardia. Irregularly irregular.  Pulmonary/Chest: Effort normal and breath sounds normal.  Few scattered rhonchi. Increased respiratory rate.  Abdominal: Soft. Bowel sounds are normal. There is no tenderness. There is no rebound and no guarding.  Musculoskeletal: Normal range of motion. She exhibits edema. She exhibits no tenderness.  Patient with edematous left lower extremity from the knee distally. Large contusion is present. Generally tender to palpation. No definite deformities.  Neurological: She is alert and oriented to person, place, and time.  Oriented to self. Follows simple commands. 5/5 motor in all extremities. Sensation intact.  Skin: Skin is warm and dry. Capillary refill takes less than 2 seconds. No rash noted. No erythema.  Nursing note and vitals reviewed.    ED Treatments / Results  Labs (all labs ordered are listed, but only abnormal results are displayed) Labs Reviewed  CBC WITH DIFFERENTIAL/PLATELET - Abnormal; Notable for the following:       Result Value   WBC 13.6 (*)    RBC 3.57 (*)    Hemoglobin 8.6 (*)    HCT 29.2 (*)    MCH 24.1 (*)    MCHC 29.5 (*)    RDW 16.0 (*)    Neutro Abs 12.0 (*)    All other components within normal limits  COMPREHENSIVE METABOLIC PANEL - Abnormal; Notable for the following:    Potassium 5.6 (*)    Chloride 99 (*)    Glucose, Bld 102 (*)    Creatinine, Ser 1.67 (*)    Albumin 3.0 (*)    ALT 12 (*)    GFR calc non Af Amer 26 (*)    GFR calc Af Amer 30 (*)    All other components within normal limits  BRAIN NATRIURETIC PEPTIDE - Abnormal; Notable for the following:    B Natriuretic Peptide 154.8 (*)    All other components within normal limits  URINALYSIS, ROUTINE W REFLEX MICROSCOPIC - Abnormal; Notable for the following:    APPearance HAZY (*)    Hgb urine dipstick MODERATE (*)    Protein, ur 30 (*)    Squamous Epithelial /  LPF 0-5 (*)    All other components within normal limits  CBC WITH DIFFERENTIAL/PLATELET - Abnormal; Notable for the following:    RBC 3.33 (*)    Hemoglobin 8.0 (*)    HCT 26.8 (*)    MCH 24.0 (*)    MCHC 29.9 (*)    RDW 15.8 (*)    All other components within normal limits  BASIC METABOLIC PANEL - Abnormal; Notable for the following:    Glucose, Bld 102 (*)    Creatinine, Ser 1.27 (*)    Calcium 8.5 (*)    GFR calc non Af Amer 36 (*)    GFR calc Af Amer 41 (*)  All other components within normal limits  OCCULT BLOOD X 1 CARD TO LAB, STOOL - Abnormal; Notable for the following:    Fecal Occult Bld POSITIVE (*)    All other components within normal limits  CK - Abnormal; Notable for the following:    Total CK 20 (*)    All other components within normal limits  CULTURE, BLOOD (ROUTINE X 2)  CULTURE, BLOOD (ROUTINE X 2)  BLOOD CULTURE ID PANEL (REFLEXED)  CULTURE, EXPECTORATED SPUTUM-ASSESSMENT  GRAM STAIN  PROTIME-INR  APTT  TSH  LIPASE, BLOOD  HIV ANTIBODY (ROUTINE TESTING)  STREP PNEUMONIAE URINARY ANTIGEN  TROPONIN I  TROPONIN I  TROPONIN I  MAGNESIUM  RPR  VITAMIN B12  AMMONIA  LEGIONELLA PNEUMOPHILA SEROGP 1 UR AG  FOLATE RBC  HEPARIN LEVEL (UNFRACTIONATED)  I-STAT TROPOININ, ED  I-STAT CG4 LACTIC ACID, ED    EKG  EKG Interpretation  Date/Time:  Wednesday Nov 19 2016 18:33:05 EDT Ventricular Rate:  162 PR Interval:    QRS Duration: 87 QT Interval:  284 QTC Calculation: 461 R Axis:   126 Text Interpretation:  Atrial fibrillation with rapid V-rate Right axis deviation Low voltage, precordial leads Minimal ST depression, inferior leads Borderline T abnormalities, inferior leads Confirmed by Zadie Rhine ([id number]) on 11/20/2016 9:40:55 AM       Radiology Ct Abdomen Pelvis Wo Contrast  Result Date: 11/20/2016 CLINICAL DATA:  Abdominal pain with left leg swelling EXAM: CT ABDOMEN AND PELVIS WITHOUT CONTRAST TECHNIQUE: Multidetector CT imaging of the  abdomen and pelvis was performed following the standard protocol without IV contrast. COMPARISON:  05/11/2016 FINDINGS: Lower chest: Trace bilateral effusions with streaky atelectasis at the left lung base. Mild cardiomegaly. Moderate hiatal hernia Hepatobiliary: Stable 2 cm cyst in the dome of the right lobe. No biliary dilatation. Gallbladder not well visualized and may be contracted. Pancreas: Atrophic.  No surrounding inflammation. Spleen: Normal in size without focal abnormality. Adrenals/Urinary Tract: Stable 15 mm low-attenuation mass in the right adrenal consistent with adenoma. Left adrenal gland within normal limits. Nonobstructing stone or intrarenal vascular calcification right kidney. Bladder normal Stomach/Bowel: The stomach is nonenlarged. No dilated small bowel. No colon wall thickening. Sigmoid colon diverticula without acute inflammation Vascular/Lymphatic: Aortic atherosclerosis. No enlarged abdominal or pelvic lymph nodes. Reproductive: Uterus and bilateral adnexa are unremarkable. Other: No free air or free fluid. Large amount of subcutaneous edema within the left lateral posterior subcutaneous fat that extends inferiorly along the left lateral lower chest wall and abdominal wall. This continues inferiorly over the left hip and pelvis. There is diffuse edema surrounding the left hip muscles with moderate diffuse edema in the proximal left thigh. Musculoskeletal: Degenerative changes of the spine. No acute or suspicious bone lesion. IMPRESSION: 1. Large amount of subcutaneous and soft tissue edema extending from the left lateral chest wall, inferiorly over the left hip. There is diffuse soft tissue edema surrounding the left hip musculature and involving the proximal left thigh. 2. There is no CT evidence for acute intra-abdominal or pelvic pathology 3. Nonobstructing stone or intrarenal vascular calcification right kidney. 4. Sigmoid colon diverticular disease without acute inflammation 5.  Moderate hiatal hernia 6. Trace bilateral effusions with linear atelectasis at the left base. 7. 1.5 cm right adrenal gland adenoma Electronically Signed   By: Jasmine Pang M.D.   On: 11/20/2016 02:16   Dg Tibia/fibula Left  Result Date: 11/19/2016 CLINICAL DATA:  Pain and swelling EXAM: LEFT TIBIA AND FIBULA - 2 VIEW COMPARISON:  None. FINDINGS:  Diffuse subcutaneous edema is identified. No fracture or dislocation is seen. Mild osteopenia is seen. IMPRESSION: Soft tissue swelling without acute bony abnormality. Electronically Signed   By: Alcide Clever M.D.   On: 11/19/2016 19:09   Ct Head Wo Contrast  Result Date: 11/20/2016 CLINICAL DATA:  Altered level of consciousness following a fall 3 weeks ago EXAM: CT HEAD WITHOUT CONTRAST CT CERVICAL SPINE WITHOUT CONTRAST TECHNIQUE: Multidetector CT imaging of the head and cervical spine was performed following the standard protocol without intravenous contrast. Multiplanar CT image reconstructions of the cervical spine were also generated. COMPARISON:  10/27/2016 FINDINGS: CT HEAD FINDINGS Brain: No acute territorial infarction, hemorrhage or intracranial mass. Moderate atrophy. Old lacunar infarct left basal ganglia. Mild to moderate periventricular small vessel ischemic changes. Stable ventricle size Vascular: No hyperdense vessels.  Carotid artery calcification Skull: No fracture or suspicious bone lesion Sinuses/Orbits: No acute finding. Other: None CT CERVICAL SPINE FINDINGS Alignment: Exaggerated lordosis. Minimal retrolisthesis of C3 on C4 and C4 on C5. Facets maintain alignment. Skull base and vertebrae: Craniovertebral junction is intact. No fracture. Soft tissues and spinal canal: No prevertebral fluid or swelling. No visible canal hematoma. Disc levels: Moderate degenerative disc changes at C4-C5. Multilevel facet arthropathy results in multilevel foraminal narrowing. Upper chest: No acute abnormality at the lung apices. Carotid artery calcification.  Other: None IMPRESSION: 1. No CT evidence for acute intracranial abnormality. Atrophy and small vessel ischemic changes 2. Exaggerated cervical lordosis.  No fracture visualized. Electronically Signed   By: Jasmine Pang M.D.   On: 11/20/2016 02:00   Ct Cervical Spine Wo Contrast  Result Date: 11/20/2016 CLINICAL DATA:  Altered level of consciousness following a fall 3 weeks ago EXAM: CT HEAD WITHOUT CONTRAST CT CERVICAL SPINE WITHOUT CONTRAST TECHNIQUE: Multidetector CT imaging of the head and cervical spine was performed following the standard protocol without intravenous contrast. Multiplanar CT image reconstructions of the cervical spine were also generated. COMPARISON:  10/27/2016 FINDINGS: CT HEAD FINDINGS Brain: No acute territorial infarction, hemorrhage or intracranial mass. Moderate atrophy. Old lacunar infarct left basal ganglia. Mild to moderate periventricular small vessel ischemic changes. Stable ventricle size Vascular: No hyperdense vessels.  Carotid artery calcification Skull: No fracture or suspicious bone lesion Sinuses/Orbits: No acute finding. Other: None CT CERVICAL SPINE FINDINGS Alignment: Exaggerated lordosis. Minimal retrolisthesis of C3 on C4 and C4 on C5. Facets maintain alignment. Skull base and vertebrae: Craniovertebral junction is intact. No fracture. Soft tissues and spinal canal: No prevertebral fluid or swelling. No visible canal hematoma. Disc levels: Moderate degenerative disc changes at C4-C5. Multilevel facet arthropathy results in multilevel foraminal narrowing. Upper chest: No acute abnormality at the lung apices. Carotid artery calcification. Other: None IMPRESSION: 1. No CT evidence for acute intracranial abnormality. Atrophy and small vessel ischemic changes 2. Exaggerated cervical lordosis.  No fracture visualized. Electronically Signed   By: Jasmine Pang M.D.   On: 11/20/2016 02:00   Dg Chest Port 1 View  Result Date: 11/19/2016 CLINICAL DATA:  Dyspnea and  dementia EXAM: PORTABLE CHEST 1 VIEW COMPARISON:  10/27/2016 FINDINGS: Cardiomegaly is stable. Tortuous atherosclerotic aorta is likewise unchanged. The patient's chin obscures the apices more so on the right. Subtle and airspace opacity is noted in the right mid lung since prior exam, some of which is due to overlapping ribs and scapula but findings are suspicious for an evolving pneumonia. Left basilar scarring is noted. Clinical correlation is suggested. Old left-sided lower rib fractures are stable. IMPRESSION: 1. Stable cardiomegaly with aortic  atherosclerosis. 2. Subtle opacity in the right mid lung is suspicious for an evolving pneumonia although some of this appearance is due to overlapping ribs and scapula. Electronically Signed   By: Tollie Eth M.D.   On: 11/19/2016 20:45    Procedures Procedures (including critical care time)  Medications Ordered in ED Medications  diltiazem (CARDIZEM) 100 mg in dextrose 5% (1 mg/mL) infusion (5 mg/hr Intravenous New Bag/Given 11/20/16 0328)  levothyroxine (SYNTHROID, LEVOTHROID) injection 25 mcg (25 mcg Intravenous Given 11/21/16 0859)  albuterol (PROVENTIL) (2.5 MG/3ML) 0.083% nebulizer solution 2.5 mg (not administered)  acetaminophen (TYLENOL) suppository 650 mg (not administered)  ondansetron (ZOFRAN) injection 4 mg (not administered)  vancomycin (VANCOCIN) 500 mg in sodium chloride 0.9 % 100 mL IVPB (not administered)  levofloxacin (LEVAQUIN) IVPB 500 mg (not administered)  MEDLINE mouth rinse (0 mLs Mouth Rinse Hold 11/21/16 1000)  0.9 %  sodium chloride infusion ( Intravenous New Bag/Given 11/21/16 0443)  LORazepam (ATIVAN) injection 0.5 mg (0.5 mg Intravenous Given 11/21/16 0921)  heparin ADULT infusion 100 units/mL (25000 units/231mL sodium chloride 0.45%) (800 Units/hr Intravenous New Bag/Given 11/21/16 0900)  mirtazapine (REMERON) tablet 15 mg (not administered)  escitalopram (LEXAPRO) tablet 10 mg (not administered)  haloperidol lactate  (HALDOL) injection 2 mg (not administered)  diltiazem (CARDIZEM) 100 mg in dextrose 5% (1 mg/mL) infusion (0 mg/hr Intravenous Stopped 11/19/16 2307)  LORazepam (ATIVAN) injection 0.5 mg (0.5 mg Intravenous Given 11/19/16 1928)  sodium chloride 0.9 % bolus 500 mL (0 mLs Intravenous Stopped 11/19/16 2215)  levofloxacin (LEVAQUIN) IVPB 500 mg (0 mg Intravenous Stopped 11/19/16 2251)  sodium polystyrene (KAYEXALATE) 15 GM/60ML suspension 30 g (30 g Rectal Given 11/20/16 0343)  vancomycin (VANCOCIN) IVPB 1000 mg/200 mL premix (0 mg Intravenous Stopped 11/20/16 0138)  sodium chloride 0.9 % bolus 250 mL (0 mLs Intravenous Stopped 11/20/16 1443)  sodium chloride 0.9 % bolus 250 mL (250 mLs Intravenous New Bag/Given 11/21/16 0444)   CRITICAL CARE Performed by: Ranae Palms, Shaquayla Klimas Total critical care time: 35 minutes Critical care time was exclusive of separately billable procedures and treating other patients. Critical care was necessary to treat or prevent imminent or life-threatening deterioration. Critical care was time spent personally by me on the following activities: development of treatment plan with patient and/or surrogate as well as nursing, discussions with consultants, evaluation of patient's response to treatment, examination of patient, obtaining history from patient or surrogate, ordering and performing treatments and interventions, ordering and review of laboratory studies, ordering and review of radiographic studies, pulse oximetry and re-evaluation of patient's condition.  Initial Impression / Assessment and Plan / ED Course  I have reviewed the triage vital signs and the nursing notes.  Pertinent labs & imaging results that were available during my care of the patient were reviewed by me and considered in my medical decision making (see chart for details).     Patient started on IV Cardizem drip. Required mild sedation for agitation. Review of patient's records and he patient was taken off of  her chronic anticoagulants due to GI bleed. Heart rate improved with Cardizem but also had decline in blood pressure. Systolic in the high 90s. Cardizem titrated down. Subtle opacity on x-ray concerning for possible early pneumonia. Started on IV antibiotics and blood culture sent. Lactic acid was normal.  Discuss with hospitalist who will evaluate patient in emergency department and admit to step down bed. Final Clinical Impressions(s) / ED Diagnoses   Final diagnoses:  Abdominal pain  Left leg swelling  New Prescriptions Current Discharge Medication List       Loren Racer, MD 11/21/16 [phone]

## 2016-11-19 NOTE — ED Notes (Addendum)
Hospitalist paged for update on BP. Rate titrated down

## 2016-11-19 NOTE — ED Notes (Signed)
Please call pt niece who is POA, Sandford Craze, at [phone]

## 2016-11-19 NOTE — ED Notes (Signed)
Xray notified to please try to complete portable xray

## 2016-11-19 NOTE — H&P (Signed)
History and Physical    Melanie Blake RUE:[id number] DOB: [birth year] DOA: 11/19/2016  Referring MD/NP/PA:   PCP: [facility]   Patient coming from:  The patient is coming from assisted living facility.  At baseline, pt is dependent for most of ADL.   Chief Complaint: AMS, fall  HPI: Melanie Blake is a 83 y.o. female with medical history significant of dementia, GI bleeding, CKD-III, A fib not on anticoagulants, hypothyroidism, depression, who presents with altered mental status and fall.  I spoke with patient's power of attorney, her niece on the phone. Per her niece, patient was found to be confused since last night. Patient was taking Eliquis for atrial fibrillation, which was discontinued recently because of recent GI bleeding. Per report, pt was found to have tachycardia with heart rates up to 160-200. Her niece states that the patient had fall 3 weeks ago, and had left leg injury. Patient has left leg bruise, swelling and 2 hematomas. She seems to have abdominal pain, but no active nausea, vomiting or diarrhea noted. No active coughing or respiratory distress noted today. No sure if patient has chest pain or shortness of breath. Patient moves all extremities.  ED Course: pt was found to have Afib with RVR, WBC 13.6, lactate 1.18, INR 1.12, PTT 24, negative troponin, negative urinalysis, BNP 154.8, potassium 5.6 without T-wave peaking on EKG, stable renal function, negative x-ray of the left tibia/fibula for bony fracture. Chest x-ray showed subtle opacity in the right middle lobe. Blood pressure is soft 89/44 which improved to SBP90s. Pt is admitted to stepdown as inpatient.  Review of Systems: Could not be reviewed due to altered mental status.  Allergy:  Allergies  Allergen Reactions  . Penicillins Swelling    Has patient had a PCN reaction causing immediate rash, facial/tongue/throat swelling, SOB or lightheadedness with hypotension: Yes Has patient had a PCN  reaction causing severe rash involving mucus membranes or skin necrosis: No Has patient had a PCN reaction that required hospitalization unknown Has patient had a PCN reaction occurring within the last 10 years: No If all of the above answers are "NO", then may proceed with Cephalosporin use.  . Sulfa Antibiotics Hives and Swelling    Past Medical History:  Diagnosis Date  . Atrial fibrillation (HCC)    dx 10/2012; s/p DCCV => NSR 12/23/2012  . Cataract   . Chronic diastolic heart failure (HCC)    a. Echo 4/14: EF "grossly normal", mild AI, mild MR, PASP 40, mild reduced RVSF    Past Surgical History:  Procedure Laterality Date  . CARDIOVERSION N/A 12/23/2012   Procedure: CARDIOVERSION;  Surgeon: Laurey Morale, MD;  Location: [facility] ENDOSCOPY;  Service: Cardiovascular;  Laterality: N/A;    Social History:  reports that she has never smoked. She has never used smokeless tobacco. She reports that she does not drink alcohol or use drugs.  Family History:  Family History  Problem Relation Age of Onset  . Heart attack Neg Hx      Prior to Admission medications   Medication Sig Start Date End Date Taking? Authorizing Provider  acetaminophen (TYLENOL) 500 MG tablet Take 2 tablets (1,000 mg total) by mouth every 8 (eight) hours as needed. Patient taking differently: Take 1,000 mg by mouth every 8 (eight) hours as needed for headache (pain).  05/23/16  Yes Shanker Levora Dredge, MD  escitalopram (LEXAPRO) 10 MG tablet Take 10 mg by mouth at bedtime.    Yes Historical Provider, MD  levothyroxine (SYNTHROID, LEVOTHROID) 50 MCG tablet Take 50 mcg by mouth daily before breakfast.   Yes Historical Provider, MD  mirtazapine (REMERON) 15 MG tablet Take 15 mg by mouth at bedtime.   Yes Historical Provider, MD  ondansetron (ZOFRAN) 4 MG tablet Take 4 mg by mouth every 8 (eight) hours as needed for nausea or vomiting.   Yes Historical Provider, MD  traMADol (ULTRAM) 50 MG tablet Take 50 mg by mouth every 6  (six) hours. Scheduled: 12a, 6a, 12p, 6p   Yes Historical Provider, MD    Physical Exam: Vitals:   11/19/16 2300 11/19/16 2345 11/20/16 0015 11/20/16 0045  BP: (!) 89/44 (!) 97/43 (!) 96/42 (!) 92/52  Pulse:  93  (!) 150  Resp: Temp:      TempSrc:      SpO2:  100%  100%   General: Not in acute distress HEENT:       Eyes: PERRL, EOMI, no scleral icterus.       ENT: No discharge from the ears and nose, no pharynx injection, no tonsillar enlargement.        Neck: No JVD, no bruit, no mass felt. Heme: No neck lymph node enlargement. Cardiac: S1/S2, irregularly irregular rhythm, No murmurs, No gallops or rubs. Respiratory:  No rales, wheezing, rhonchi or rubs. GI: Soft, nondistended, has tenderness in central abdomen, no rebound pain, no organomegaly, BS present. GU: No hematuria Ext: has swelling, bruises and hematomas in left leg. 2+DP/PT pulse bilaterally. Musculoskeletal: No joint deformities, No joint redness or warmth, no limitation of ROM in spin. Skin: No rashes.  Neuro: confused, not oriented X3, cranial nerves II-XII grossly intact, moves all extremities normally.  Psych: Patient is not psychotic, no suicidal or hemocidal ideation.  Labs on Admission: I have personally reviewed following labs and imaging studies  CBC:  Recent Labs Lab 11/19/16 1940  WBC 13.6*  NEUTROABS 12.0*  HGB 8.6*  HCT 29.2*  MCV 81.8  PLT 324   Basic Metabolic Panel:  Recent Labs Lab 11/19/16 1940  NA 135  K 5.6*  CL 99*  CO2 23  GLUCOSE 102*  BUN 19  CREATININE 1.67*  CALCIUM 9.2   GFR: CrCl cannot be calculated (Unknown ideal weight.). Liver Function Tests:  Recent Labs Lab 11/19/16 1940  AST 27  ALT 12*  ALKPHOS 109  BILITOT 1.0  PROT 6.9  ALBUMIN 3.0*    Recent Labs Lab 11/20/16 0011  LIPASE 23   No results for input(s): AMMONIA in the last 168 hours. Coagulation Profile:  Recent Labs Lab 11/19/16 1940  INR 1.12   Cardiac Enzymes: No  results for input(s): CKTOTAL, CKMB, CKMBINDEX, TROPONINI in the last 168 hours. BNP (last 3 results) No results for input(s): PROBNP in the last 8760 hours. HbA1C: No results for input(s): HGBA1C in the last 72 hours. CBG: No results for input(s): GLUCAP in the last 168 hours. Lipid Profile: No results for input(s): CHOL, HDL, LDLCALC, TRIG, CHOLHDL, LDLDIRECT in the last 72 hours. Thyroid Function Tests:  Recent Labs  11/19/16 2100  TSH 1.907   Anemia Panel: No results for input(s): VITAMINB12, FOLATE, FERRITIN, TIBC, IRON, RETICCTPCT in the last 72 hours. Urine analysis:    Component Value Date/Time   COLORURINE YELLOW 11/19/2016 2100   APPEARANCEUR HAZY (A) 11/19/2016 2100   LABSPEC 1.017 11/19/2016 2100   PHURINE 5.0 11/19/2016 2100   GLUCOSEU NEGATIVE 11/19/2016 2100   HGBUR MODERATE (A) 11/19/2016 2100   BILIRUBINUR  NEGATIVE 11/19/2016 2100   KETONESUR NEGATIVE 11/19/2016 2100   PROTEINUR 30 (A) 11/19/2016 2100   UROBILINOGEN 0.2 08/17/2010 1145   NITRITE NEGATIVE 11/19/2016 2100   LEUKOCYTESUR NEGATIVE 11/19/2016 2100   Sepsis Labs: (procalcitonin:4,lacticidven:4) )No results found for this or any previous visit (from the past 240 hour(s)).   Radiological Exams on Admission: Dg Tibia/fibula Left  Result Date: 11/19/2016 CLINICAL DATA:  Pain and swelling EXAM: LEFT TIBIA AND FIBULA - 2 VIEW COMPARISON:  None. FINDINGS: Diffuse subcutaneous edema is identified. No fracture or dislocation is seen. Mild osteopenia is seen. IMPRESSION: Soft tissue swelling without acute bony abnormality. Electronically Signed   By: Alcide Clever M.D.   On: 11/19/2016 19:09   Dg Chest Port 1 View  Result Date: 11/19/2016 CLINICAL DATA:  Dyspnea and dementia EXAM: PORTABLE CHEST 1 VIEW COMPARISON:  10/27/2016 FINDINGS: Cardiomegaly is stable. Tortuous atherosclerotic aorta is likewise unchanged. The patient's chin obscures the apices more so on the right. Subtle and airspace  opacity is noted in the right mid lung since prior exam, some of which is due to overlapping ribs and scapula but findings are suspicious for an evolving pneumonia. Left basilar scarring is noted. Clinical correlation is suggested. Old left-sided lower rib fractures are stable. IMPRESSION: 1. Stable cardiomegaly with aortic atherosclerosis. 2. Subtle opacity in the right mid lung is suspicious for an evolving pneumonia although some of this appearance is due to overlapping ribs and scapula. Electronically Signed   By: Tollie Eth M.D.   On: 11/19/2016 20:45     EKG: Independently reviewed.  Atrial fibrillation with RVR, QTC 461, poor R-wave progression   Assessment/Plan Principal Problem:   Atrial fibrillation with RVR (HCC) Active Problems:   Chronic diastolic CHF (congestive heart failure) (HCC)   Stage 3 chronic kidney disease   Abdominal tenderness   Hyperkalemia   Acute encephalopathy   Depression   HCAP (healthcare-associated pneumonia)   Atrial fibrillation with RVR (HCC):  Per report, pt was found to have tachycardia with heart rates up to 1 60-200. Pt responded to IV cardizem gtt, currently HR is 90-100. CHA2DS2-VASc Score is 4, needs oral anticoagulation, but patient is not on anticoagulant at home due to recent GI bleeding.  -will admit to SDU as inpt -continue IV cardizem gtt -check TSH, Trop x 3 -2d echo  Fall: pt had fall 3 weeks with left leg injury. No bony fracture on left leg.Patient had any head injury. Now pt has AMS. -CT head and CT neck - get left LE venous doppler to r/o DVT -pt/ot when able to   Possible HCAP: Patient has leukocytosis. Chest x-ray showed subtle opacity in RML. Given her soft blood pressure and oxygen desaturation, will treat pt presumably HCAP. - IV Vancomycin, Aztreonam.  - pt received one dose of levaquin in ED, will continue - Mucinex for cough  - prn Albuterol Nebs SOB - Urine legionella and S. pneumococcal antigen - Follow up blood  culture x2, sputum culture  Acute encephalopathy: Etiology is not clear, likely multifactorial including A. fib with RVR, possible HCAP and electrolytes disturbance -Treat underlying issues as above -Frequent neuro check  Chronic diastolic CHF: 2-D echo on 10/28/12 showed LVEF grossly normal with basal inferiorhypokinesis. Patient is not taking diuretics at home. No JVD. No pulmonary edema on chest x-ray. BNP is 154.8. CHF seems to be compensated. - Monitoring for liver status closely -f/u 2d echo as above  CKD-III: stable. Baseline creatinine 1.4-1.8. Her creatinine is 1.67. -Follow-up renal  function by BMP  Hyperkalemia: Potassium of 5.6. Without EKG change -Kayexalate 30 g 1 proximal to  Abdominal tenderness: Patient has abdominal tenderness in the central abdomen on physical examination. Etiology is not clear. -Check lipase - CT abdomen/pelvis  Depression: -hold oral med due to AMS  Hypothyroidism: Last TSH was 1.907 today -Continue Synthroid by IV, cut dose from 50 to 25 micrograms daily   DVT ppx: SCD to right leg only Code Status: DNR (I discussed with patient's POA, her Niece on the phone, explained the meaning of CODE STATUS. Per her POA, patient would want to be DNR) Family Communication: Yes, patient's POA, her Niece on the phone Disposition Plan:  Anticipate discharge back to previous assisted living facility Consults called:  none Admission status: SDU/inpation       Date of Service 11/20/2016    Lorretta Harp [facility] Pager [phone]  If 7PM-7AM, please contact night-coverage [email or web address] Password [email or web address] 11/20/2016, 1:13 AM

## 2016-11-19 NOTE — ED Triage Notes (Signed)
Pt here from nursing home with c/o aloc , pt was found to be in afib rvr 160-200, no hx , pt has hx of dementia

## 2016-11-19 NOTE — ED Notes (Signed)
Dr. Ranae Palms notified we were unable to obtain TSH level at this time

## 2016-11-20 ENCOUNTER — Inpatient Hospital Stay (HOSPITAL_COMMUNITY): Payer: Medicare Other

## 2016-11-20 DIAGNOSIS — M7989 Other specified soft tissue disorders: Secondary | ICD-10-CM

## 2016-11-20 DIAGNOSIS — R10819 Abdominal tenderness, unspecified site: Secondary | ICD-10-CM

## 2016-11-20 DIAGNOSIS — I5032 Chronic diastolic (congestive) heart failure: Secondary | ICD-10-CM

## 2016-11-20 DIAGNOSIS — I82492 Acute embolism and thrombosis of other specified deep vein of left lower extremity: Secondary | ICD-10-CM

## 2016-11-20 DIAGNOSIS — R935 Abnormal findings on diagnostic imaging of other abdominal regions, including retroperitoneum: Secondary | ICD-10-CM

## 2016-11-20 LAB — TROPONIN I
Troponin I: <0.03 ng/mL (ref <0.03)
Troponin I: <0.03 ng/mL (ref <0.03)

## 2016-11-20 LAB — HIV ANTIBODY (ROUTINE TESTING W REFLEX): HIV SCREEN 4TH GENERATION: NONREACTIVE

## 2016-11-20 LAB — BASIC METABOLIC PANEL
Anion gap: 8 (ref 5–15)
BUN: 16 mg/dL (ref 6–20)
CALCIUM: 8.5 mg/dL — AB (ref 8.9–10.3)
CO2: 26 mmol/L (ref 22–32)
CREATININE: 1.27 mg/dL — AB (ref 0.44–1.00)
Chloride: 101 mmol/L (ref 101–111)
GFR calc Af Amer: 41 mL/min — ABNORMAL LOW (ref >60)
GFR, EST NON AFRICAN AMERICAN: 36 mL/min — AB (ref >60)
Glucose, Bld: 102 mg/dL — ABNORMAL HIGH (ref 65–99)
Potassium: 4.5 mmol/L (ref 3.5–5.1)
Sodium: 135 mmol/L (ref 135–145)

## 2016-11-20 LAB — CBC WITH DIFFERENTIAL/PLATELET
BASOS ABS: 0 10*3/uL (ref 0.0–0.1)
Basophils Relative: 0 %
EOS PCT: 0 %
Eosinophils Absolute: 0 10*3/uL (ref 0.0–0.7)
HCT: 26.8 % — ABNORMAL LOW (ref 36.0–46.0)
Hemoglobin: 8 g/dL — ABNORMAL LOW (ref 12.0–15.0)
LYMPHS ABS: 0.8 10*3/uL (ref 0.7–4.0)
LYMPHS PCT: 9 %
MCH: 24 pg — AB (ref 26.0–34.0)
MCHC: 29.9 g/dL — AB (ref 30.0–36.0)
MCV: 80.5 fL (ref 78.0–100.0)
MONO ABS: 0.7 10*3/uL (ref 0.1–1.0)
MONOS PCT: 9 %
Neutro Abs: 6.7 10*3/uL (ref 1.7–7.7)
Neutrophils Relative %: 82 %
PLATELETS: 260 10*3/uL (ref 150–400)
RBC: 3.33 MIL/uL — ABNORMAL LOW (ref 3.87–5.11)
RDW: 15.8 % — AB (ref 11.5–15.5)
WBC: 8.2 10*3/uL (ref 4.0–10.5)

## 2016-11-20 LAB — OCCULT BLOOD X 1 CARD TO LAB, STOOL: Fecal Occult Bld: POSITIVE — AB

## 2016-11-20 LAB — AMMONIA: Ammonia: 17 umol/L (ref 9–35)

## 2016-11-20 LAB — VITAMIN B12: VITAMIN B 12: 673 pg/mL (ref 180–914)

## 2016-11-20 LAB — MAGNESIUM: Magnesium: 2.2 mg/dL (ref 1.7–2.4)

## 2016-11-20 LAB — LIPASE, BLOOD: LIPASE: 23 U/L (ref 11–51)

## 2016-11-20 LAB — STREP PNEUMONIAE URINARY ANTIGEN: STREP PNEUMO URINARY ANTIGEN: NEGATIVE

## 2016-11-20 LAB — CK: CK TOTAL: 20 U/L — AB (ref 38–234)

## 2016-11-20 MED ORDER — ORAL CARE MOUTH RINSE
15.0000 mL | Freq: Two times a day (BID) | OROMUCOSAL | Status: DC
  Administered 2016-11-20 – 2016-11-24 (×5): 15 mL via OROMUCOSAL

## 2016-11-20 MED ORDER — DILTIAZEM HCL 30 MG PO TABS: 30.0000 mg | ORAL_TABLET | Freq: Four times a day (QID) | ORAL | Status: DC

## 2016-11-20 MED ORDER — HEPARIN (PORCINE) IN NACL 100-0.45 UNIT/ML-% IJ SOLN
850.0000 [IU]/h | INTRAMUSCULAR | Status: DC
  Administered 2016-11-20: 850 [IU]/h via INTRAVENOUS
  Filled 2016-11-20: qty 250

## 2016-11-20 MED ORDER — VANCOMYCIN HCL 500 MG IV SOLR: 500.0000 mg | INTRAVENOUS | Status: DC

## 2016-11-20 MED ORDER — LEVOFLOXACIN IN D5W 500 MG/100ML IV SOLN: 500.0000 mg | INTRAVENOUS | Status: DC

## 2016-11-20 MED ORDER — SODIUM CHLORIDE 0.9 % IV BOLUS (SEPSIS)
250.0000 mL | Freq: Once | INTRAVENOUS | Status: AC
  Administered 2016-11-20: 250 mL via INTRAVENOUS

## 2016-11-20 MED ORDER — HEPARIN BOLUS VIA INFUSION
3000.0000 [IU] | Freq: Once | INTRAVENOUS | Status: DC
  Filled 2016-11-20: qty 3000

## 2016-11-20 MED ORDER — SODIUM CHLORIDE 0.9 % IV SOLN
INTRAVENOUS | Status: AC
  Administered 2016-11-20 – 2016-11-21 (×2): via INTRAVENOUS

## 2016-11-20 NOTE — Progress Notes (Addendum)
PROGRESS NOTE    Melanie Blake  ZOX:[id number] DOB: 09/04/1923 DOA: 11/19/2016 PCP: [facility]    Brief Narrative:  Patient is a 83 year old female history significant for dementia, GI bleed, chronic kidney disease stage III, atrial fibrillation not on anticoagulation, hypothyroidism, depression who was recently hospitalized from 10/27/2016-10/28/2016 secondary to a fall with generalized weakness and dizziness. Patient noted during the hospitalization to have low normal blood pressure and a such patient's Cardizem Lasix and metoprolol were discontinued on discharge. Patient also noted to have swelling in the lower extremity as well as soft tissue swelling with a recent drop in hemoglobin and a such patient's anticoagulation was discontinued on discharge. Patient presented back to the ED with altered mental status and fall and noted to be in A. fib with RVR with heart rates in the 160s to the 200s. Patient placed on a Cardizem drip. Patient also noted to have a leukocytosis and chest x-ray with subtle opacity in the right middle lung lobe and a such patient placed on antibiotics due to hypotension for probable healthcare associated pneumonia.   Assessment & Plan:   Principal Problem:   Atrial fibrillation with RVR (HCC) Active Problems:   Chronic diastolic CHF (congestive heart failure) (HCC)   Stage 3 chronic kidney disease   Abdominal tenderness   Hyperkalemia   Acute encephalopathy   Depression   HCAP (healthcare-associated pneumonia)   Abnormal CT of the abdomen   #1 A. fib with RVR CHA2DS2VASC 4 Patient noted on presentation to be in A. fib with RVR. Patient on Cardizem drip with heart rates noted this morning to be in the 130s however improving. Patient's A. fib with RVR likely secondary to discontinuation of her rate limiting medications during last hospitalization. Cardiac enzymes have been negative 2. 2-D echo pending. TSH at 1.907. Continue Cardizem drip. Follow.  As heart rate improves we'll try to transition back to home regimen of oral Cardizem. Follow.  #2 chronic diastolic heart failure Stable. Patient seems compensated.  #3 probable HCAP On admission patient noted to have a leukocytosis was tachycardic and chest x-ray with subtle opacity in the right middle lung lobe. Urine Legionella antigen pending urine pneumococcus antigen pending. Blood cultures pending. Sputum Gram stain and culture pending. Patient placed empirically on IV antibiotics for probable healthcare associated pneumonia.  #4 acute encephalopathy ?? Etiology. Patient with a baseline dementia. Will be multifactorial secondary to A. fib with RVR, possible pneumonia and electrolyte disturbance. CT head negative for any acute abnormalities. Continue empiric IV antibiotics. Continue to manage atrial fibrillation.  #5 hyperkalemia On admission potassium was 5.6. EKG with no peak T waves. Labs pending.  #6 depression Stable. Antidepressant medications on hold.  #7 hypothyroidism Continue IV Synthroid.  #8 dysphagia Per nursing patient choked while trying to drink tea this morning. We'll place on a dysphagia 1 diet. SLP evaluation.  #9 chronic kidney disease stage III Baseline creatinine 1.4-1.8. Labs pending this morning. Labs on admission showed chronic kidney disease was stable.  #10 abdominal tenderness Patient with no further abdominal tenderness this morning. Lipase levels within normal limits. CT abdomen and pelvis with a large amount of subcutaneous and soft tissue edema extending from the left lateral chest wall, inferiorly over the left hip. Diffuse soft tissue edema surrounding the left hip musculature and involving the left proximal thigh. No CT evidence of acute intra-abdominal or pelvic pathology.  #11 abnormal CT findings Secondary to fall. CT abdomen and pelvis with a large amount of subcutaneous and  soft tissue edema extending from the left lateral chest wall,  inferiorly over the left hip. Diffuse soft tissue edema surrounding the left hip musculature and involving the left proximal thigh. No CT evidence of acute intra-abdominal or pelvic pathology. Will curbside trauma surgery for further recommendations on CT findings.  #12 addendum Extensive left lower extremity DVT Patient underwent lower extremity Dopplers which showed an extensive left lower extremity DVT from the left, femoral vein throughout the extremity. Left iliac appear thrombosed. Consulted vascular surgery, Dr. Randie Heinz who came and assessed the patient and in discussion with Dr. Violeta Gelinas of trauma surgery recommended patient be placed on anticoagulation with IV heparin with close monitoring of hematoma and following patient's H&H. If patient's hemoglobin drops then will need to discontinue IV heparin and place IVC filter. Appreciate vascular surgery input and, surgery input. Updated and spoke with patient's niece who is the healthcare power of attorney.   #12 dehydration IV fluids.  #13 fall Patient with fall 3 weeks ago with left leg injury with no bony fracture on the leg. Patient with no head injury. CT head and neck negative for any acute abnormalities. Lower extremity Dopplers pending. PT/OT.     DVT prophylaxis: SCDs Code Status: DO NOT RESUSCITATE Family Communication: Updated patient and niece who is healthcare power of attorney at bedside. Disposition Plan: Likely back to assisted living facility versus skilled nursing facility once medically stable, A. fib with RVR resolved and blood pressure stable.   Consultants:   None  Procedures:   CT abdomen and pelvis 11/20/2016  CT head CT C-spine 11/20/2016  Chest x-ray 11/19/2016  Plain films of Left tib-fib 11/19/2016  Antimicrobials:   IV vancomycin 11/19/2016  IV Levaquin 11/19/2016  IV Azactam 11/19/2016>>> 11/20/2016   Subjective: Patient laying in bed. Patient denies any chest pain. No shortness of  breath. Patient oriented to self only. Patient can't quite tell me exactly what what happened to bring her into the hospital.  Objective: Vitals:   11/20/16 0616 11/20/16 0815 11/20/16 0846 11/20/16 1146  BP: 108/69 (!) 124/56  96/63  Pulse:    90  Resp:    18  Temp:   98 F (36.7 C) 97.8 F (36.6 C)  TempSrc:   Oral Axillary  SpO2:   96% 98%  Weight:      Height:        Intake/Output Summary (Last 24 hours) at 11/20/16 1158 Last data filed at 11/20/16 0400  Gross per 24 hour  Intake           586.67 ml  Output                0 ml  Net           586.67 ml   Filed Weights   11/20/16 0159  Weight: 52.9 kg (116 lb 9.6 oz)    Examination:  General exam: Appears calm and comfortable.Severe scoliotic kyphosis. Extremely dry mucous membranes. Respiratory system: Clear to auscultation. Respiratory effort normal. Cardiovascular system: Irregularly irregular. Grade 3/6 systolic ejection murmur. No JVD, murmurs, rubs, gallops or clicks. Left lower extremity tight and swollen with ecchymosis and likely hematoma. Gastrointestinal system: Abdomen is nondistended, soft and nontender. No organomegaly or masses felt. Normal bowel sounds heard. Central nervous system: Alert and oriented to self only. No focal neurological deficits. Extremities:" Left lower extremity tight, swollen, significant ecchymosis and probable hematoma. Skin: No rashes, lesions or ulcers Psychiatry: Judgement and insight appear poor to fair. Mood & affect  appropriate.     Data Reviewed: I have personally reviewed following labs and imaging studies  CBC:  Recent Labs Lab 11/19/16 1940  WBC 13.6*  NEUTROABS 12.0*  HGB 8.6*  HCT 29.2*  MCV 81.8  PLT 324   Basic Metabolic Panel:  Recent Labs Lab 11/19/16 1940  NA 135  K 5.6*  CL 99*  CO2 23  GLUCOSE 102*  BUN 19  CREATININE 1.67*  CALCIUM 9.2   GFR: Estimated Creatinine Clearance: 15.8 mL/min (A) (by C-G formula based on SCr of 1.67 mg/dL  (H)). Liver Function Tests:  Recent Labs Lab 11/19/16 1940  AST 27  ALT 12*  ALKPHOS 109  BILITOT 1.0  PROT 6.9  ALBUMIN 3.0*    Recent Labs Lab 11/20/16 0011  LIPASE 23   No results for input(s): AMMONIA in the last 168 hours. Coagulation Profile:  Recent Labs Lab 11/19/16 1940  INR 1.12   Cardiac Enzymes:  Recent Labs Lab 11/20/16 0202 11/20/16 0707  TROPONINI <0.03 <0.03   BNP (last 3 results) No results for input(s): PROBNP in the last 8760 hours. HbA1C: No results for input(s): HGBA1C in the last 72 hours. CBG: No results for input(s): GLUCAP in the last 168 hours. Lipid Profile: No results for input(s): CHOL, HDL, LDLCALC, TRIG, CHOLHDL, LDLDIRECT in the last 72 hours. Thyroid Function Tests:  Recent Labs  11/19/16 2100  TSH 1.907   Anemia Panel: No results for input(s): VITAMINB12, FOLATE, FERRITIN, TIBC, IRON, RETICCTPCT in the last 72 hours. Sepsis Labs:  Recent Labs Lab 11/19/16 2210  LATICACIDVEN 1.18    Recent Results (from the past 240 hour(s))  Culture, blood (Routine X 2) w Reflex to ID Panel     Status: None (Preliminary result)   Collection Time: 11/19/16  9:50 PM  Result Value Ref Range Status   Specimen Description BLOOD LEFT ANTECUBITAL  Final   Special Requests   Final    BOTTLES DRAWN AEROBIC AND ANAEROBIC Blood Culture adequate volume   Culture NO GROWTH < 12 HOURS  Final   Report Status PENDING  Incomplete  Culture, blood (Routine X 2) w Reflex to ID Panel     Status: None (Preliminary result)   Collection Time: 11/19/16  9:55 PM  Result Value Ref Range Status   Specimen Description BLOOD  Final   Special Requests   Final    BOTTLES DRAWN AEROBIC ONLY Blood Culture adequate volume   Culture NO GROWTH < 12 HOURS  Final   Report Status PENDING  Incomplete         Radiology Studies: Ct Abdomen Pelvis Wo Contrast  Result Date: 11/20/2016 CLINICAL DATA:  Abdominal pain with left leg swelling EXAM: CT ABDOMEN AND  PELVIS WITHOUT CONTRAST TECHNIQUE: Multidetector CT imaging of the abdomen and pelvis was performed following the standard protocol without IV contrast. COMPARISON:  05/11/2016 FINDINGS: Lower chest: Trace bilateral effusions with streaky atelectasis at the left lung base. Mild cardiomegaly. Moderate hiatal hernia Hepatobiliary: Stable 2 cm cyst in the dome of the right lobe. No biliary dilatation. Gallbladder not well visualized and may be contracted. Pancreas: Atrophic.  No surrounding inflammation. Spleen: Normal in size without focal abnormality. Adrenals/Urinary Tract: Stable 15 mm low-attenuation mass in the right adrenal consistent with adenoma. Left adrenal gland within normal limits. Nonobstructing stone or intrarenal vascular calcification right kidney. Bladder normal Stomach/Bowel: The stomach is nonenlarged. No dilated small bowel. No colon wall thickening. Sigmoid colon diverticula without acute inflammation Vascular/Lymphatic: Aortic atherosclerosis. No enlarged  abdominal or pelvic lymph nodes. Reproductive: Uterus and bilateral adnexa are unremarkable. Other: No free air or free fluid. Large amount of subcutaneous edema within the left lateral posterior subcutaneous fat that extends inferiorly along the left lateral lower chest wall and abdominal wall. This continues inferiorly over the left hip and pelvis. There is diffuse edema surrounding the left hip muscles with moderate diffuse edema in the proximal left thigh. Musculoskeletal: Degenerative changes of the spine. No acute or suspicious bone lesion. IMPRESSION: 1. Large amount of subcutaneous and soft tissue edema extending from the left lateral chest wall, inferiorly over the left hip. There is diffuse soft tissue edema surrounding the left hip musculature and involving the proximal left thigh. 2. There is no CT evidence for acute intra-abdominal or pelvic pathology 3. Nonobstructing stone or intrarenal vascular calcification right kidney. 4.  Sigmoid colon diverticular disease without acute inflammation 5. Moderate hiatal hernia 6. Trace bilateral effusions with linear atelectasis at the left base. 7. 1.5 cm right adrenal gland adenoma Electronically Signed   By: Jasmine Pang M.D.   On: 11/20/2016 02:16   Dg Tibia/fibula Left  Result Date: 11/19/2016 CLINICAL DATA:  Pain and swelling EXAM: LEFT TIBIA AND FIBULA - 2 VIEW COMPARISON:  None. FINDINGS: Diffuse subcutaneous edema is identified. No fracture or dislocation is seen. Mild osteopenia is seen. IMPRESSION: Soft tissue swelling without acute bony abnormality. Electronically Signed   By: Alcide Clever M.D.   On: 11/19/2016 19:09   Ct Head Wo Contrast  Result Date: 11/20/2016 CLINICAL DATA:  Altered level of consciousness following a fall 3 weeks ago EXAM: CT HEAD WITHOUT CONTRAST CT CERVICAL SPINE WITHOUT CONTRAST TECHNIQUE: Multidetector CT imaging of the head and cervical spine was performed following the standard protocol without intravenous contrast. Multiplanar CT image reconstructions of the cervical spine were also generated. COMPARISON:  10/27/2016 FINDINGS: CT HEAD FINDINGS Brain: No acute territorial infarction, hemorrhage or intracranial mass. Moderate atrophy. Old lacunar infarct left basal ganglia. Mild to moderate periventricular small vessel ischemic changes. Stable ventricle size Vascular: No hyperdense vessels.  Carotid artery calcification Skull: No fracture or suspicious bone lesion Sinuses/Orbits: No acute finding. Other: None CT CERVICAL SPINE FINDINGS Alignment: Exaggerated lordosis. Minimal retrolisthesis of C3 on C4 and C4 on C5. Facets maintain alignment. Skull base and vertebrae: Craniovertebral junction is intact. No fracture. Soft tissues and spinal canal: No prevertebral fluid or swelling. No visible canal hematoma. Disc levels: Moderate degenerative disc changes at C4-C5. Multilevel facet arthropathy results in multilevel foraminal narrowing. Upper chest: No acute  abnormality at the lung apices. Carotid artery calcification. Other: None IMPRESSION: 1. No CT evidence for acute intracranial abnormality. Atrophy and small vessel ischemic changes 2. Exaggerated cervical lordosis.  No fracture visualized. Electronically Signed   By: Jasmine Pang M.D.   On: 11/20/2016 02:00   Ct Cervical Spine Wo Contrast  Result Date: 11/20/2016 CLINICAL DATA:  Altered level of consciousness following a fall 3 weeks ago EXAM: CT HEAD WITHOUT CONTRAST CT CERVICAL SPINE WITHOUT CONTRAST TECHNIQUE: Multidetector CT imaging of the head and cervical spine was performed following the standard protocol without intravenous contrast. Multiplanar CT image reconstructions of the cervical spine were also generated. COMPARISON:  10/27/2016 FINDINGS: CT HEAD FINDINGS Brain: No acute territorial infarction, hemorrhage or intracranial mass. Moderate atrophy. Old lacunar infarct left basal ganglia. Mild to moderate periventricular small vessel ischemic changes. Stable ventricle size Vascular: No hyperdense vessels.  Carotid artery calcification Skull: No fracture or suspicious bone lesion Sinuses/Orbits: No acute  finding. Other: None CT CERVICAL SPINE FINDINGS Alignment: Exaggerated lordosis. Minimal retrolisthesis of C3 on C4 and C4 on C5. Facets maintain alignment. Skull base and vertebrae: Craniovertebral junction is intact. No fracture. Soft tissues and spinal canal: No prevertebral fluid or swelling. No visible canal hematoma. Disc levels: Moderate degenerative disc changes at C4-C5. Multilevel facet arthropathy results in multilevel foraminal narrowing. Upper chest: No acute abnormality at the lung apices. Carotid artery calcification. Other: None IMPRESSION: 1. No CT evidence for acute intracranial abnormality. Atrophy and small vessel ischemic changes 2. Exaggerated cervical lordosis.  No fracture visualized. Electronically Signed   By: Jasmine Pang M.D.   On: 11/20/2016 02:00   Dg Chest Port 1  View  Result Date: 11/19/2016 CLINICAL DATA:  Dyspnea and dementia EXAM: PORTABLE CHEST 1 VIEW COMPARISON:  10/27/2016 FINDINGS: Cardiomegaly is stable. Tortuous atherosclerotic aorta is likewise unchanged. The patient's chin obscures the apices more so on the right. Subtle and airspace opacity is noted in the right mid lung since prior exam, some of which is due to overlapping ribs and scapula but findings are suspicious for an evolving pneumonia. Left basilar scarring is noted. Clinical correlation is suggested. Old left-sided lower rib fractures are stable. IMPRESSION: 1. Stable cardiomegaly with aortic atherosclerosis. 2. Subtle opacity in the right mid lung is suspicious for an evolving pneumonia although some of this appearance is due to overlapping ribs and scapula. Electronically Signed   By: Tollie Eth M.D.   On: 11/19/2016 20:45        Scheduled Meds: . levothyroxine  25 mcg Intravenous Daily  . mouth rinse  15 mL Mouth Rinse BID   Continuous Infusions: . sodium chloride    . diltiazem (CARDIZEM) infusion 5 mg/hr (11/20/16 0328)  . [START ON 11/21/2016] levofloxacin (LEVAQUIN) IV    . [START ON 11/22/2016] vancomycin       LOS: 1 day    Time spent: 40 minutes    THOMPSON,DANIEL, MD [facility] Pager [phone]  If 7PM-7AM, please contact night-coverage [email or web address] Password [email or web address] 11/20/2016, 11:58 AM

## 2016-11-20 NOTE — Consult Note (Addendum)
Admit date: 11/19/2016 Referring Physician  Dr. Janee Morn Primary Physician  [facility] Primary Cardiologist  None Reason for Consultation  Atrial fibrillation with RVR  HPI: Melanie Blake is a 83 y.o. female who is being seen today for the evaluation of  atrial fibrillation with RVR at the request of Dr. Janee Morn with [facility].  She has a history of dementia, GI bleed, CKD stage III, permanent atrial fibrillation on no anticoagulation (had been taking Eliquis but this was stopped due to GI bleed) and depression who presented to ER with complaints of MS changes and a fall.  According to her niece she was found confused for the past 24 hours and was found to have tachycardia up to 160-200bpm. She apparently fell 3 weeks ago and sustained a left leg injury sustaining hematomas on her leg.  In ER, BNP was 154 and K 5.6 and cxray showed a RML infiltrate c/w PNA.  WBC elevated at 13.6.  Cardiology was asked to see to help with treatment of afib.      PMH:   Past Medical History:  Diagnosis Date  . Atrial fibrillation (HCC)    dx 10/2012; s/p DCCV => NSR 12/23/2012  . Cataract   . Chronic diastolic heart failure (HCC)    a. Echo 4/14: EF "grossly normal", mild AI, mild MR, PASP 40, mild reduced RVSF     PSH:   Past Surgical History:  Procedure Laterality Date  . CARDIOVERSION N/A 12/23/2012   Procedure: CARDIOVERSION;  Surgeon: Laurey Morale, MD;  Location: [facility] ENDOSCOPY;  Service: Cardiovascular;  Laterality: N/A;    Allergies:  Penicillins and Sulfa antibiotics Prior to Admit Meds:   Prescriptions Prior to Admission  Medication Sig Dispense Refill Last Dose  . acetaminophen (TYLENOL) 500 MG tablet Take 2 tablets (1,000 mg total) by mouth every 8 (eight) hours as needed. (Patient taking differently: Take 1,000 mg by mouth every 8 (eight) hours as needed for headache (pain). ) 30 tablet 0 PRN  . escitalopram (LEXAPRO) 10 MG tablet Take 10 mg by mouth at bedtime.     11/18/2016 at Unknown time  . levothyroxine (SYNTHROID, LEVOTHROID) 50 MCG tablet Take 50 mcg by mouth daily before breakfast.   11/19/2016 at Unknown time  . mirtazapine (REMERON) 15 MG tablet Take 15 mg by mouth at bedtime.   11/18/2016 at Unknown time  . ondansetron (ZOFRAN) 4 MG tablet Take 4 mg by mouth every 8 (eight) hours as needed for nausea or vomiting.   PRN  . traMADol (ULTRAM) 50 MG tablet Take 50 mg by mouth every 6 (six) hours. Scheduled: 12a, 6a, 12p, 6p   11/19/2016 at Unknown time   Fam HX:    Family History  Problem Relation Age of Onset  . Heart attack Neg Hx    Social HX:    Social History   Social History  . Marital status: Widowed    Spouse name: N/A  . Number of children: N/A  . Years of education: N/A   Occupational History  . Not on file.   Social History Main Topics  . Smoking status: Never Smoker  . Smokeless tobacco: Never Used  . Alcohol use No  . Drug use: No  . Sexual activity: No   Other Topics Concern  . Not on file   Social History Narrative  . No narrative on file     ROS:  All  ROS were addressed and are negative except what is stated  in the HPI  Physical Exam: Blood pressure 140/69, pulse (!) 126, temperature 97.8 F (36.6 C), temperature source Axillary, resp. rate (!) 26, height 5' (1.524 m), weight 116 lb 9.6 oz (52.9 kg), SpO2 98 %.    General: Well developed, well nourished, in no acute distress Head: Eyes PERRLA, No xanthomas.   Normal cephalic and atramatic  Lungs:   Clear bilaterally to auscultation and percussion. Heart:   Irregularly irregular S1 S2 Pulses are 2+ & equal.            No carotid bruit. No JVD.  No abdominal bruits. No femoral bruits. Abdomen: Bowel sounds are positive, abdomen soft and non-tender without masses  Msk:  Back normal, normal gait. Normal strength and tone for age. Extremities:   No clubbing, cyanosis or edema.  DP +1 Neuro: Alert and oriented X 3. Psych:  Good affect, responds  appropriately    Labs:   Lab Results  Component Value Date   WBC 8.2 11/20/2016   HGB 8.0 (L) 11/20/2016   HCT 26.8 (L) 11/20/2016   MCV 80.5 11/20/2016   PLT 260 11/20/2016    Recent Labs Lab 11/19/16 1940 11/20/16 1244  NA 135 135  K 5.6* 4.5  CL 99* 101  CO2 23 26  BUN 19 16  CREATININE 1.67* 1.27*  CALCIUM 9.2 8.5*  PROT 6.9  --   BILITOT 1.0  --   ALKPHOS 109  --   ALT 12*  --   AST 27  --   GLUCOSE 102* 102*   No results found for: PTT Lab Results  Component Value Date   INR 1.12 11/19/2016   INR 3.8 01/14/2013   INR 4.1 01/04/2013   Lab Results  Component Value Date   CKTOTAL 20 (L) 11/20/2016   TROPONINI <0.03 11/20/2016    No results found for: CHOL No results found for: HDL No results found for: LDLCALC No results found for: TRIG No results found for: CHOLHDL No results found for: LDLDIRECT    Radiology:  Ct Abdomen Pelvis Wo Contrast  Result Date: 11/20/2016 CLINICAL DATA:  Abdominal pain with left leg swelling EXAM: CT ABDOMEN AND PELVIS WITHOUT CONTRAST TECHNIQUE: Multidetector CT imaging of the abdomen and pelvis was performed following the standard protocol without IV contrast. COMPARISON:  05/11/2016 FINDINGS: Lower chest: Trace bilateral effusions with streaky atelectasis at the left lung base. Mild cardiomegaly. Moderate hiatal hernia Hepatobiliary: Stable 2 cm cyst in the dome of the right lobe. No biliary dilatation. Gallbladder not well visualized and may be contracted. Pancreas: Atrophic.  No surrounding inflammation. Spleen: Normal in size without focal abnormality. Adrenals/Urinary Tract: Stable 15 mm low-attenuation mass in the right adrenal consistent with adenoma. Left adrenal gland within normal limits. Nonobstructing stone or intrarenal vascular calcification right kidney. Bladder normal Stomach/Bowel: The stomach is nonenlarged. No dilated small bowel. No colon wall thickening. Sigmoid colon diverticula without acute inflammation  Vascular/Lymphatic: Aortic atherosclerosis. No enlarged abdominal or pelvic lymph nodes. Reproductive: Uterus and bilateral adnexa are unremarkable. Other: No free air or free fluid. Large amount of subcutaneous edema within the left lateral posterior subcutaneous fat that extends inferiorly along the left lateral lower chest wall and abdominal wall. This continues inferiorly over the left hip and pelvis. There is diffuse edema surrounding the left hip muscles with moderate diffuse edema in the proximal left thigh. Musculoskeletal: Degenerative changes of the spine. No acute or suspicious bone lesion. IMPRESSION: 1. Large amount of subcutaneous and soft tissue edema extending  from the left lateral chest wall, inferiorly over the left hip. There is diffuse soft tissue edema surrounding the left hip musculature and involving the proximal left thigh. 2. There is no CT evidence for acute intra-abdominal or pelvic pathology 3. Nonobstructing stone or intrarenal vascular calcification right kidney. 4. Sigmoid colon diverticular disease without acute inflammation 5. Moderate hiatal hernia 6. Trace bilateral effusions with linear atelectasis at the left base. 7. 1.5 cm right adrenal gland adenoma Electronically Signed   By: Jasmine Pang M.D.   On: 11/20/2016 02:16   Dg Tibia/fibula Left  Result Date: 11/19/2016 CLINICAL DATA:  Pain and swelling EXAM: LEFT TIBIA AND FIBULA - 2 VIEW COMPARISON:  None. FINDINGS: Diffuse subcutaneous edema is identified. No fracture or dislocation is seen. Mild osteopenia is seen. IMPRESSION: Soft tissue swelling without acute bony abnormality. Electronically Signed   By: Alcide Clever M.D.   On: 11/19/2016 19:09   Ct Head Wo Contrast  Result Date: 11/20/2016 CLINICAL DATA:  Altered level of consciousness following a fall 3 weeks ago EXAM: CT HEAD WITHOUT CONTRAST CT CERVICAL SPINE WITHOUT CONTRAST TECHNIQUE: Multidetector CT imaging of the head and cervical spine was performed following  the standard protocol without intravenous contrast. Multiplanar CT image reconstructions of the cervical spine were also generated. COMPARISON:  10/27/2016 FINDINGS: CT HEAD FINDINGS Brain: No acute territorial infarction, hemorrhage or intracranial mass. Moderate atrophy. Old lacunar infarct left basal ganglia. Mild to moderate periventricular small vessel ischemic changes. Stable ventricle size Vascular: No hyperdense vessels.  Carotid artery calcification Skull: No fracture or suspicious bone lesion Sinuses/Orbits: No acute finding. Other: None CT CERVICAL SPINE FINDINGS Alignment: Exaggerated lordosis. Minimal retrolisthesis of C3 on C4 and C4 on C5. Facets maintain alignment. Skull base and vertebrae: Craniovertebral junction is intact. No fracture. Soft tissues and spinal canal: No prevertebral fluid or swelling. No visible canal hematoma. Disc levels: Moderate degenerative disc changes at C4-C5. Multilevel facet arthropathy results in multilevel foraminal narrowing. Upper chest: No acute abnormality at the lung apices. Carotid artery calcification. Other: None IMPRESSION: 1. No CT evidence for acute intracranial abnormality. Atrophy and small vessel ischemic changes 2. Exaggerated cervical lordosis.  No fracture visualized. Electronically Signed   By: Jasmine Pang M.D.   On: 11/20/2016 02:00   Ct Cervical Spine Wo Contrast  Result Date: 11/20/2016 CLINICAL DATA:  Altered level of consciousness following a fall 3 weeks ago EXAM: CT HEAD WITHOUT CONTRAST CT CERVICAL SPINE WITHOUT CONTRAST TECHNIQUE: Multidetector CT imaging of the head and cervical spine was performed following the standard protocol without intravenous contrast. Multiplanar CT image reconstructions of the cervical spine were also generated. COMPARISON:  10/27/2016 FINDINGS: CT HEAD FINDINGS Brain: No acute territorial infarction, hemorrhage or intracranial mass. Moderate atrophy. Old lacunar infarct left basal ganglia. Mild to moderate  periventricular small vessel ischemic changes. Stable ventricle size Vascular: No hyperdense vessels.  Carotid artery calcification Skull: No fracture or suspicious bone lesion Sinuses/Orbits: No acute finding. Other: None CT CERVICAL SPINE FINDINGS Alignment: Exaggerated lordosis. Minimal retrolisthesis of C3 on C4 and C4 on C5. Facets maintain alignment. Skull base and vertebrae: Craniovertebral junction is intact. No fracture. Soft tissues and spinal canal: No prevertebral fluid or swelling. No visible canal hematoma. Disc levels: Moderate degenerative disc changes at C4-C5. Multilevel facet arthropathy results in multilevel foraminal narrowing. Upper chest: No acute abnormality at the lung apices. Carotid artery calcification. Other: None IMPRESSION: 1. No CT evidence for acute intracranial abnormality. Atrophy and small vessel ischemic changes 2. Exaggerated  cervical lordosis.  No fracture visualized. Electronically Signed   By: Jasmine PangKim  Fujinaga M.D.   On: 11/20/2016 02:00   Dg Chest Port 1 View  Result Date: 11/19/2016 CLINICAL DATA:  Dyspnea and dementia EXAM: PORTABLE CHEST 1 VIEW COMPARISON:  10/27/2016 FINDINGS: Cardiomegaly is stable. Tortuous atherosclerotic aorta is likewise unchanged. The patient's chin obscures the apices more so on the right. Subtle and airspace opacity is noted in the right mid lung since prior exam, some of which is due to overlapping ribs and scapula but findings are suspicious for an evolving pneumonia. Left basilar scarring is noted. Clinical correlation is suggested. Old left-sided lower rib fractures are stable. IMPRESSION: 1. Stable cardiomegaly with aortic atherosclerosis. 2. Subtle opacity in the right mid lung is suspicious for an evolving pneumonia although some of this appearance is due to overlapping ribs and scapula. Electronically Signed   By: Tollie Ethavid  Kwon M.D.   On: 11/19/2016 20:45     Telemetry    Atrial fibrillation with RVR - Personally Reviewed  ECG     Atrial fibrillation with RVR and nonspecific ST abnormality - Personally Reviewed   ASSESSMENT/PLAN:   1.  Atrial fibrillation with RVR - she has permanent atrial fibrillation and now afib with RVR likely related to acute respiratory illness.  BP initially soft but now 140/6669mmHg.  Agree with continuing Cardizem gtt for rate control.  If BP limits titration of Cardizem gtt may need to consider IV Amio.  She has a history of GIB and anticoagulation was stopped. Given this history and Hbg of 8 would not anticoagulate at present.  Check 2D echo to assess LVF in am. Check TSH.    2.  Chronic diastolic CHF - she appears euvolemic on exam.    3.  Possible HCAP with leukocytosis and RML infiltrate.  Started on IV antibiotics per IM.    4.  Acute encephalopathy ? Secondary to infection.    5.  Hyperkalemia - given Kayexalate per TRH. Armanda Magicraci Terika Pillard, MD  11/20/2016  5:55 PM

## 2016-11-20 NOTE — Progress Notes (Signed)
Pharmacy Antibiotic Note  Melanie Blake is a 83 y.o. female admitted from NH on 11/19/2016 with pneumonia.  Pharmacy has been consulted for Vancomycin, Aztreonam, and Levaquin dosing x 8 days. Estimated CrCl ~16 ml/min  Plan: Levaquin 500mg  IV q48h x 8 days Aztreonam 1gm IV q8h x 8 days Vancomycin 1gm now then 500mg  Iv q48h x 8 days Will f/u micro data, renal function, and pt's clinical condition Vanc trough prn      Temp (24hrs), Avg:97.7 F (36.5 C), Min:97.7 F (36.5 C), Max:97.7 F (36.5 C)   Recent Labs Lab 11/19/16 1940 11/19/16 2210  WBC 13.6*  --   CREATININE 1.67*  --   LATICACIDVEN  --  1.18    CrCl cannot be calculated (Unknown ideal weight.).    Allergies  Allergen Reactions  . Penicillins Swelling    Has patient had a PCN reaction causing immediate rash, facial/tongue/throat swelling, SOB or lightheadedness with hypotension: Yes Has patient had a PCN reaction causing severe rash involving mucus membranes or skin necrosis: No Has patient had a PCN reaction that required hospitalization unknown Has patient had a PCN reaction occurring within the last 10 years: No If all of the above answers are "NO", then may proceed with Cephalosporin use.  . Sulfa Antibiotics Hives and Swelling    Antimicrobials this admission: 5/2 Levaquin >>  5/3 Vanc >>  5/3 Aztreonam >>  Dose adjustments this admission: n/a  Microbiology results: 5/2 BCx x2:   Thank you for allowing pharmacy to be a part of this patient's care.  Christoper Fabianaron Purnell Daigle, PharmD, BCPS Clinical pharmacist, pager [phone] 11/20/2016 12:09 AM

## 2016-11-20 NOTE — Consult Note (Signed)
[facility] Surgery Consult Note  Melanie Blake [facility] 1923-12-13  [id number].    Requesting MD: Irine Seal, MD Chief Complaint/Reason for Consult: left abdominal/hip/thigh edema s/p fall HPI:  [name] is a 83 y.o. Female with MMP including dementia who was admitted to the hospital from an assisted living facility 11/19/16 for confusion and a.fib with RVR. She was recently admitted 10/27/16-10/28/16 after a fall 2/2 dizziness/weakness. During that hospitalization she was found to have left lower extremity swelling, no fractures, and positive FOBT but refused any GI procedures. At the time of discharge her metoprolol, lasix, and apixaban were discontinued secondary to lower-limit of normal BP and anemia. This admission, CT abd/pelv revealed a large amount of subcutaneous and soft tissue edema extending from the left lateral chest wall, inferiorly over the left hip and proximal left thigh. Trauma has been asked to evaluate patients left-sided edema and ecchymosis.   ROS: Review of Systems  Unable to perform ROS: Mental status change  Cardiovascular: Negative for chest pain.  Gastrointestinal: Negative for abdominal pain.   ROS limited by mental status/dementia  Family History  Problem Relation Age of Onset  . Heart attack Neg Hx     Past Medical History:  Diagnosis Date  . Atrial fibrillation ([place])    dx 10/2012; s/p DCCV => NSR 12/23/2012  . Cataract   . Chronic diastolic heart failure ([place])    a. Echo 4/14: EF "grossly normal", mild AI, mild MR, PASP 40, mild reduced RVSF    Past Surgical History:  Procedure Laterality Date  . CARDIOVERSION N/A 12/23/2012   Procedure: CARDIOVERSION;  Surgeon: Larey Dresser, MD;  Location: [facility];  Service: Cardiovascular;  Laterality: N/A;    Social History:  reports that she has never smoked. She has never used smokeless tobacco. She reports that she does not drink alcohol or use drugs.  Allergies:  Allergies  Allergen Reactions   . Penicillins Swelling    Has patient had a PCN reaction causing immediate rash, facial/tongue/throat swelling, SOB or lightheadedness with hypotension: Yes Has patient had a PCN reaction causing severe rash involving mucus membranes or skin necrosis: No Has patient had a PCN reaction that required hospitalization unknown Has patient had a PCN reaction occurring within the last 10 years: No If all of the above answers are "NO", then may proceed with Cephalosporin use.  . Sulfa Antibiotics Hives and Swelling    Medications Prior to Admission  Medication Sig Dispense Refill  . acetaminophen (TYLENOL) 500 MG tablet Take 2 tablets (1,000 mg total) by mouth every 8 (eight) hours as needed. (Patient taking differently: Take 1,000 mg by mouth every 8 (eight) hours as needed for headache (pain). ) 30 tablet 0  . escitalopram (LEXAPRO) 10 MG tablet Take 10 mg by mouth at bedtime.     Marland Kitchen levothyroxine (SYNTHROID, LEVOTHROID) 50 MCG tablet Take 50 mcg by mouth daily before breakfast.    . mirtazapine (REMERON) 15 MG tablet Take 15 mg by mouth at bedtime.    . ondansetron (ZOFRAN) 4 MG tablet Take 4 mg by mouth every 8 (eight) hours as needed for nausea or vomiting.    . traMADol (ULTRAM) 50 MG tablet Take 50 mg by mouth every 6 (six) hours. Scheduled: 12a, 6a, 12p, 6p      Blood pressure 96/63, pulse 90, temperature 97.8 F (36.6 C), temperature source Axillary, resp. rate 18, height 5' (1.524 m), weight 52.9 kg (116 lb 9.6 oz), SpO2 98 %. Physical Exam: Physical Exam  Constitutional: She appears well-developed and well-nourished. No distress.  HENT:  Head: Normocephalic and atraumatic.  Right Ear: External ear normal.  Left Ear: External ear normal.  Eyes: Conjunctivae are normal. Right eye exhibits no discharge. Left eye exhibits no discharge.  Neck: Normal range of motion. No tracheal deviation present.  Cardiovascular: Normal rate.  Exam reveals no gallop and no friction rub.   No murmur  heard. Pulmonary/Chest: Effort normal and breath sounds normal. No stridor. No respiratory distress. She has no wheezes. She has no rales. She exhibits no tenderness.  Abdominal: Soft. She exhibits no distension. There is no tenderness.  Musculoskeletal: She exhibits edema. She exhibits no deformity.  Neurological:  Not oriented to person, place, or time.  Skin: Skin is warm and dry. She is not diaphoretic. No erythema.  Left chest wall//flank/hip non-tender and mildly edematous. Mild ecchymosis of left hip. No visible hematoma.   Venous stasis dermatitis to bilateral lower extremities Ecchymosis to LLE    Results for orders placed or performed during the hospital encounter of 11/19/16 (from the past 48 hour(s))  CBC with Differential/Platelet     Status: Abnormal   Collection Time: 11/19/16  7:40 PM  Result Value Ref Range   WBC 13.6 (H) 4.0 - 10.5 K/uL   RBC 3.57 (L) 3.87 - 5.11 MIL/uL   Hemoglobin 8.6 (L) 12.0 - 15.0 g/dL   HCT 29.2 (L) 36.0 - 46.0 %   MCV 81.8 78.0 - 100.0 fL   MCH 24.1 (L) 26.0 - 34.0 pg   MCHC 29.5 (L) 30.0 - 36.0 g/dL   RDW 16.0 (H) 11.5 - 15.5 %   Platelets 324 150 - 400 K/uL   Neutrophils Relative % 89 %   Neutro Abs 12.0 (H) 1.7 - 7.7 K/uL   Lymphocytes Relative 6 %   Lymphs Abs 0.8 0.7 - 4.0 K/uL   Monocytes Relative 5 %   Monocytes Absolute 0.7 0.1 - 1.0 K/uL   Eosinophils Relative 0 %   Eosinophils Absolute 0.0 0.0 - 0.7 K/uL   Basophils Relative 0 %   Basophils Absolute 0.0 0.0 - 0.1 K/uL  Comprehensive metabolic panel     Status: Abnormal   Collection Time: 11/19/16  7:40 PM  Result Value Ref Range   Sodium 135 135 - 145 mmol/L   Potassium 5.6 (H) 3.5 - 5.1 mmol/L   Chloride 99 (L) 101 - 111 mmol/L   CO2 23 22 - 32 mmol/L   Glucose, Bld 102 (H) 65 - 99 mg/dL   BUN 19 6 - 20 mg/dL   Creatinine, Ser 1.67 (H) 0.44 - 1.00 mg/dL   Calcium 9.2 8.9 - 10.3 mg/dL   Total Protein 6.9 6.5 - 8.1 g/dL   Albumin 3.0 (L) 3.5 - 5.0 g/dL   AST 27 15 - 41  U/L   ALT 12 (L) 14 - 54 U/L   Alkaline Phosphatase 109 38 - 126 U/L   Total Bilirubin 1.0 0.3 - 1.2 mg/dL   GFR calc non Af Amer 26 (L) >60 mL/min   GFR calc Af Amer 30 (L) >60 mL/min    Comment: (NOTE) The eGFR has been calculated using the CKD EPI equation. This calculation has not been validated in all clinical situations. eGFR's persistently <60 mL/min signify possible Chronic Kidney Disease.    Anion gap 13 5 - 15  Brain natriuretic peptide     Status: Abnormal   Collection Time: 11/19/16  7:40 PM  Result Value Ref Range  B Natriuretic Peptide 154.8 (H) 0.0 - 100.0 pg/mL  Protime-INR     Status: None   Collection Time: 11/19/16  7:40 PM  Result Value Ref Range   Prothrombin Time 14.4 11.4 - 15.2 seconds   INR 1.12   APTT     Status: None   Collection Time: 11/19/16  7:40 PM  Result Value Ref Range   aPTT 24 24 - 36 seconds  I-stat troponin, ED     Status: None   Collection Time: 11/19/16  7:44 PM  Result Value Ref Range   Troponin i, poc 0.01 0.00 - 0.08 ng/mL   Comment 3            Comment: Due to the release kinetics of cTnI, a negative result within the first hours of the onset of symptoms does not rule out myocardial infarction with certainty. If myocardial infarction is still suspected, repeat the test at appropriate intervals.   Urinalysis, Routine w reflex microscopic     Status: Abnormal   Collection Time: 11/19/16  9:00 PM  Result Value Ref Range   Color, Urine YELLOW YELLOW   APPearance HAZY (A) CLEAR   Specific Gravity, Urine 1.017 1.005 - 1.030   pH 5.0 5.0 - 8.0   Glucose, UA NEGATIVE NEGATIVE mg/dL   Hgb urine dipstick MODERATE (A) NEGATIVE   Bilirubin Urine NEGATIVE NEGATIVE   Ketones, ur NEGATIVE NEGATIVE mg/dL   Protein, ur 30 (A) NEGATIVE mg/dL   Nitrite NEGATIVE NEGATIVE   Leukocytes, UA NEGATIVE NEGATIVE   RBC / HPF TOO NUMEROUS TO COUNT 0 - 5 RBC/hpf   WBC, UA 6-30 0 - 5 WBC/hpf   Bacteria, UA NONE SEEN NONE SEEN   Squamous Epithelial  / LPF 0-5 (A) NONE SEEN   Hyaline Casts, UA PRESENT   TSH     Status: None   Collection Time: 11/19/16  9:00 PM  Result Value Ref Range   TSH 1.907 0.350 - 4.500 uIU/mL    Comment: Performed by a 3rd Generation assay with a functional sensitivity of <=0.01 uIU/mL.  Culture, blood (Routine X 2) w Reflex to ID Panel     Status: None (Preliminary result)   Collection Time: 11/19/16  9:50 PM  Result Value Ref Range   Specimen Description BLOOD LEFT ANTECUBITAL    Special Requests      BOTTLES DRAWN AEROBIC AND ANAEROBIC Blood Culture adequate volume   Culture NO GROWTH < 12 HOURS    Report Status PENDING   Culture, blood (Routine X 2) w Reflex to ID Panel     Status: None (Preliminary result)   Collection Time: 11/19/16  9:55 PM  Result Value Ref Range   Specimen Description BLOOD    Special Requests      BOTTLES DRAWN AEROBIC ONLY Blood Culture adequate volume   Culture NO GROWTH < 12 HOURS    Report Status PENDING   I-Stat CG4 Lactic Acid, ED     Status: None   Collection Time: 11/19/16 10:10 PM  Result Value Ref Range   Lactic Acid, Venous 1.18 0.5 - 1.9 mmol/L  Lipase, blood     Status: None   Collection Time: 11/20/16 12:11 AM  Result Value Ref Range   Lipase 23 11 - 51 U/L  Troponin I (q 6hr x 3)     Status: None   Collection Time: 11/20/16  2:02 AM  Result Value Ref Range   Troponin I <0.03 <0.03 ng/mL  Troponin I (q 6hr  x 3)     Status: None   Collection Time: 11/20/16  7:07 AM  Result Value Ref Range   Troponin I <0.03 <0.03 ng/mL   Ct Abdomen Pelvis Wo Contrast  Result Date: 11/20/2016 CLINICAL DATA:  Abdominal pain with left leg swelling EXAM: CT ABDOMEN AND PELVIS WITHOUT CONTRAST TECHNIQUE: Multidetector CT imaging of the abdomen and pelvis was performed following the standard protocol without IV contrast. COMPARISON:  05/11/2016 FINDINGS: Lower chest: Trace bilateral effusions with streaky atelectasis at the left lung base. Mild cardiomegaly. Moderate hiatal hernia  Hepatobiliary: Stable 2 cm cyst in the dome of the right lobe. No biliary dilatation. Gallbladder not well visualized and may be contracted. Pancreas: Atrophic.  No surrounding inflammation. Spleen: Normal in size without focal abnormality. Adrenals/Urinary Tract: Stable 15 mm low-attenuation mass in the right adrenal consistent with adenoma. Left adrenal gland within normal limits. Nonobstructing stone or intrarenal vascular calcification right kidney. Bladder normal Stomach/Bowel: The stomach is nonenlarged. No dilated small bowel. No colon wall thickening. Sigmoid colon diverticula without acute inflammation Vascular/Lymphatic: Aortic atherosclerosis. No enlarged abdominal or pelvic lymph nodes. Reproductive: Uterus and bilateral adnexa are unremarkable. Other: No free air or free fluid. Large amount of subcutaneous edema within the left lateral posterior subcutaneous fat that extends inferiorly along the left lateral lower chest wall and abdominal wall. This continues inferiorly over the left hip and pelvis. There is diffuse edema surrounding the left hip muscles with moderate diffuse edema in the proximal left thigh. Musculoskeletal: Degenerative changes of the spine. No acute or suspicious bone lesion. IMPRESSION: 1. Large amount of subcutaneous and soft tissue edema extending from the left lateral chest wall, inferiorly over the left hip. There is diffuse soft tissue edema surrounding the left hip musculature and involving the proximal left thigh. 2. There is no CT evidence for acute intra-abdominal or pelvic pathology 3. Nonobstructing stone or intrarenal vascular calcification right kidney. 4. Sigmoid colon diverticular disease without acute inflammation 5. Moderate hiatal hernia 6. Trace bilateral effusions with linear atelectasis at the left base. 7. 1.5 cm right adrenal gland adenoma Electronically Signed   By: Donavan Foil M.D.   On: 11/20/2016 02:16   Dg Tibia/fibula Left  Result Date:  11/19/2016 CLINICAL DATA:  Pain and swelling EXAM: LEFT TIBIA AND FIBULA - 2 VIEW COMPARISON:  None. FINDINGS: Diffuse subcutaneous edema is identified. No fracture or dislocation is seen. Mild osteopenia is seen. IMPRESSION: Soft tissue swelling without acute bony abnormality. Electronically Signed   By: Inez Catalina M.D.   On: 11/19/2016 19:09   Ct Head Wo Contrast  Result Date: 11/20/2016 CLINICAL DATA:  Altered level of consciousness following a fall 3 weeks ago EXAM: CT HEAD WITHOUT CONTRAST CT CERVICAL SPINE WITHOUT CONTRAST TECHNIQUE: Multidetector CT imaging of the head and cervical spine was performed following the standard protocol without intravenous contrast. Multiplanar CT image reconstructions of the cervical spine were also generated. COMPARISON:  10/27/2016 FINDINGS: CT HEAD FINDINGS Brain: No acute territorial infarction, hemorrhage or intracranial mass. Moderate atrophy. Old lacunar infarct left basal ganglia. Mild to moderate periventricular small vessel ischemic changes. Stable ventricle size Vascular: No hyperdense vessels.  Carotid artery calcification Skull: No fracture or suspicious bone lesion Sinuses/Orbits: No acute finding. Other: None CT CERVICAL SPINE FINDINGS Alignment: Exaggerated lordosis. Minimal retrolisthesis of C3 on C4 and C4 on C5. Facets maintain alignment. Skull base and vertebrae: Craniovertebral junction is intact. No fracture. Soft tissues and spinal canal: No prevertebral fluid or swelling. No visible canal  hematoma. Disc levels: Moderate degenerative disc changes at C4-C5. Multilevel facet arthropathy results in multilevel foraminal narrowing. Upper chest: No acute abnormality at the lung apices. Carotid artery calcification. Other: None IMPRESSION: 1. No CT evidence for acute intracranial abnormality. Atrophy and small vessel ischemic changes 2. Exaggerated cervical lordosis.  No fracture visualized. Electronically Signed   By: Donavan Foil M.D.   On: 11/20/2016  02:00   Ct Cervical Spine Wo Contrast  Result Date: 11/20/2016 CLINICAL DATA:  Altered level of consciousness following a fall 3 weeks ago EXAM: CT HEAD WITHOUT CONTRAST CT CERVICAL SPINE WITHOUT CONTRAST TECHNIQUE: Multidetector CT imaging of the head and cervical spine was performed following the standard protocol without intravenous contrast. Multiplanar CT image reconstructions of the cervical spine were also generated. COMPARISON:  10/27/2016 FINDINGS: CT HEAD FINDINGS Brain: No acute territorial infarction, hemorrhage or intracranial mass. Moderate atrophy. Old lacunar infarct left basal ganglia. Mild to moderate periventricular small vessel ischemic changes. Stable ventricle size Vascular: No hyperdense vessels.  Carotid artery calcification Skull: No fracture or suspicious bone lesion Sinuses/Orbits: No acute finding. Other: None CT CERVICAL SPINE FINDINGS Alignment: Exaggerated lordosis. Minimal retrolisthesis of C3 on C4 and C4 on C5. Facets maintain alignment. Skull base and vertebrae: Craniovertebral junction is intact. No fracture. Soft tissues and spinal canal: No prevertebral fluid or swelling. No visible canal hematoma. Disc levels: Moderate degenerative disc changes at C4-C5. Multilevel facet arthropathy results in multilevel foraminal narrowing. Upper chest: No acute abnormality at the lung apices. Carotid artery calcification. Other: None IMPRESSION: 1. No CT evidence for acute intracranial abnormality. Atrophy and small vessel ischemic changes 2. Exaggerated cervical lordosis.  No fracture visualized. Electronically Signed   By: Donavan Foil M.D.   On: 11/20/2016 02:00   Dg Chest Port 1 View  Result Date: 11/19/2016 CLINICAL DATA:  Dyspnea and dementia EXAM: PORTABLE CHEST 1 VIEW COMPARISON:  10/27/2016 FINDINGS: Cardiomegaly is stable. Tortuous atherosclerotic aorta is likewise unchanged. The patient's chin obscures the apices more so on the right. Subtle and airspace opacity is noted in  the right mid lung since prior exam, some of which is due to overlapping ribs and scapula but findings are suspicious for an evolving pneumonia. Left basilar scarring is noted. Clinical correlation is suggested. Old left-sided lower rib fractures are stable. IMPRESSION: 1. Stable cardiomegaly with aortic atherosclerosis. 2. Subtle opacity in the right mid lung is suspicious for an evolving pneumonia although some of this appearance is due to overlapping ribs and scapula. Electronically Signed   By: Ashley Royalty M.D.   On: 11/19/2016 20:45   Assessment/Plan Resolving left abdominal, hip, and thigh hematoma s/p fall 10/27/16 - external exam unimpressive compared to CT Abd/Pelv, pt denies pain, hemodynamically stable compared to previous hospital admission (hgb 8.6)  - No acute surgical needs. Recommend observation and ice/heat PRN for symptomatic relief. - agree with LE Doppler to R/O DVT. Should it be positive, ok to anticoagulate from a trauma surgery perspective.    Jill Alexanders, [facility] Surgery 11/20/2016, 12:13 PM Pager: [phone] Consults: [phone] Mon-Fri 7:00 am-4:30 pm Sat-Sun 7:00 am-11:30 am

## 2016-11-20 NOTE — ED Notes (Signed)
Pt transported to CT, then to floor. Tech to call nurse to assist.

## 2016-11-20 NOTE — ED Notes (Signed)
Patient transported to CT 

## 2016-11-20 NOTE — Progress Notes (Addendum)
*  PRELIMINARY RESULTS* Vascular Ultrasound Bilateral lower extremity venous duplex has been completed.  Preliminary findings: Left lower extremity appears positive for deep vein thrombosis from the left common femoral vein throughout the extremity.  Attempted to visualize left iliac vein and IVC. Left iliac appears thrombosed, evaluation of IVC is non diagnostic due to poor patient position, cooperation, bowel and gas, and tolerance of exam.  Right lower extremity appears negative for deep vein thrombosis. Incidental finding: Upon evaluating the Iliac veins an echogenic focus was noted in the bladder, unknown etiology.  Preliminary results given to nurse, Gwen at bedside. Attempted to page Dr. Janee Mornhompson @ 12:55.  Melanie FischerCharlotte C Yeshua Blake 11/20/2016, 12:44 PM

## 2016-11-20 NOTE — Progress Notes (Addendum)
ANTICOAGULATION CONSULT NOTE - Initial Consult  Pharmacy Consult for Heparin Indication: Plan:  + DVT LLE    Allergies  Allergen Reactions  . Penicillins Swelling    Has patient had a PCN reaction causing immediate rash, facial/tongue/throat swelling, SOB or lightheadedness with hypotension: Yes Has patient had a PCN reaction causing severe rash involving mucus membranes or skin necrosis: No Has patient had a PCN reaction that required hospitalization unknown Has patient had a PCN reaction occurring within the last 10 years: No If all of the above answers are "NO", then may proceed with Cephalosporin use.  . Sulfa Antibiotics Hives and Swelling    Patient Measurements: Height: 5' (152.4 cm) (pt. unaware of height, height from previous admission) Weight: 116 lb 9.6 oz (52.9 kg) IBW/kg (Calculated) : 45.5 Heparin Dosing Weight:  52.9 kg  Vital Signs: Temp: 97.8 F (36.6 C) (05/03 1146) Temp Source: Axillary (05/03 1146) BP: 96/63 (05/03 1146) Pulse Rate: 90 (05/03 1146)  Labs:  Recent Labs  11/19/16 1940 11/20/16 0202 11/20/16 0707 11/20/16 1244  HGB 8.6*  --   --  8.0*  HCT 29.2*  --   --  26.8*  PLT 324  --   --  260  APTT 24  --   --   --   LABPROT 14.4  --   --   --   INR 1.12  --   --   --   CREATININE 1.67*  --   --  1.27*  TROPONINI  --  <0.03 <0.03 <0.03    Estimated Creatinine Clearance: 20.7 mL/min (A) (by C-G formula based on SCr of 1.27 mg/dL (H)).   Medical History: Past Medical History:  Diagnosis Date  . Atrial fibrillation ([facility])    dx 10/2012; s/p DCCV => NSR 12/23/2012  . Cataract   . Chronic diastolic heart failure ([facility])    a. Echo 4/14: EF "grossly normal", mild AI, mild MR, PASP 40, mild reduced RVSF    Medications:  See medication reconcilation PTA list on EPIC.  Assessment: 83  y.o  Female admitted on 11/19/16 PM for AMS, fall.  Not on anticoagulation PTA. . Eliquis for h/o AFib was discontinued on this previous hospitalization  (10/27/16-10/28/16).  BLE venous duplex completed 11/20/16:  + DVT in LLE, left commonefemoral vein throughout the extremity.  RLE: negative  history significant for dementia, GI bleed, chronic kidney disease stage III, atrial fibrillation not on anticoagulation, hypothyroidism, depression who was recently hospitalized from 10/27/2016-10/28/2016 secondary to a fall with generalized weakness and dizziness. Eliquis was discontinued on this previous hospitalization.  Hgb 8.0 low/stable from yesterday. PLTC wnl.  No bleeding noted.  Goal of Therapy:  Heparin level 0.3-0.7 units/ml Monitor platelets by anticoagulation protocol: Yes   Plan:  No bolus heparin per MD's order  Start IV heparin drip 850 units/hr Check heparin level 6 hours after bolus given.  Daily HL, CBC.   Noah Delaineuth Sloane Palmer, RPh Clinical Pharmacist Pager: [phone] [phone]-[phone] [phone] Main Pharmacy [phone] 11/20/2016,3:55 PM

## 2016-11-20 NOTE — Consult Note (Signed)
Hospital Consult    Reason for Consult:  LLE DVT Referring Physician:  Janee Morn MRN #:  [id number]  History of Present Illness: This is a 83 y.o. female who was recently hospitalized for a fall in early April.  She was noted to have low blood pressure and her CCB, BB and lasix were discontinued on discharge.  She does have a hx of Afib and was on Eliquis.  She did have a +FOBT and her Eliquis was discontinued.  She did not want to have any GI procedures for evaluation.    She was admitted yesterday with AMS and fall with Afib with RVR with HR 160's-200's.  She was placed on a Cardizem gtt.  She did have a CT scan that revealed a large amount of subcutaneous soft tissue edema from the left lateral chest wall to the proximal thigh.  She was also noted to have LLE edema and a venous ultrasound was obtained and revealed a DVT from the left CFV throughout the extremity.    Past Medical History:  Diagnosis Date  . Atrial fibrillation (HCC)    dx 10/2012; s/p DCCV => NSR 12/23/2012  . Cataract   . Chronic diastolic heart failure (HCC)    a. Echo 4/14: EF "grossly normal", mild AI, mild MR, PASP 40, mild reduced RVSF    Past Surgical History:  Procedure Laterality Date  . CARDIOVERSION N/A 12/23/2012   Procedure: CARDIOVERSION;  Surgeon: Laurey Morale, MD;  Location: [facility] ENDOSCOPY;  Service: Cardiovascular;  Laterality: N/A;    Allergies  Allergen Reactions  . Penicillins Swelling    Has patient had a PCN reaction causing immediate rash, facial/tongue/throat swelling, SOB or lightheadedness with hypotension: Yes Has patient had a PCN reaction causing severe rash involving mucus membranes or skin necrosis: No Has patient had a PCN reaction that required hospitalization unknown Has patient had a PCN reaction occurring within the last 10 years: No If all of the above answers are "NO", then may proceed with Cephalosporin use.  . Sulfa Antibiotics Hives and Swelling    Prior to Admission  medications   Medication Sig Start Date End Date Taking? Authorizing Provider  acetaminophen (TYLENOL) 500 MG tablet Take 2 tablets (1,000 mg total) by mouth every 8 (eight) hours as needed. Patient taking differently: Take 1,000 mg by mouth every 8 (eight) hours as needed for headache (pain).  05/23/16  Yes Shanker Levora Dredge, MD  escitalopram (LEXAPRO) 10 MG tablet Take 10 mg by mouth at bedtime.    Yes Historical Provider, MD  levothyroxine (SYNTHROID, LEVOTHROID) 50 MCG tablet Take 50 mcg by mouth daily before breakfast.   Yes Historical Provider, MD  mirtazapine (REMERON) 15 MG tablet Take 15 mg by mouth at bedtime.   Yes Historical Provider, MD  ondansetron (ZOFRAN) 4 MG tablet Take 4 mg by mouth every 8 (eight) hours as needed for nausea or vomiting.   Yes Historical Provider, MD  traMADol (ULTRAM) 50 MG tablet Take 50 mg by mouth every 6 (six) hours. Scheduled: 12a, 6a, 12p, 6p   Yes Historical Provider, MD    Social History   Social History  . Marital status: Widowed    Spouse name: N/A  . Number of children: N/A  . Years of education: N/A   Occupational History  . Not on file.   Social History Main Topics  . Smoking status: Never Smoker  . Smokeless tobacco: Never Used  . Alcohol use No  . Drug use: No  .  Sexual activity: No   Other Topics Concern  . Not on file   Social History Narrative  . No narrative on file     Family History  Problem Relation Age of Onset  . Heart attack Neg Hx     ROS: [x]  Positive   [ ]  Negative   [ ]  All sytems reviewed and are negative  Cardiovascular: []  chest pain/pressure [x]  hx Afib [x]  heart failure []  palpitations []  SOB lying flat []  DOE []  pain in legs while walking []  pain in legs at rest []  pain in legs at night []  non-healing ulcers [x]  hx of DVT [x]  swelling in legs  Pulmonary: []  productive cough []  asthma/wheezing []  home O2  Neurologic: []  weakness in []  arms []  legs []  numbness in []  arms []  legs []   hx of CVA []  mini stroke [] difficulty speaking or slurred speech []  temporary loss of vision in one eye []  dizziness [x]  demetia  Hematologic: []  hx of cancer [x]  bleeding problems [x]  anemia []  problems with blood clotting easily  Endocrine:   []  diabetes [x]  thyroid disease  GI []  vomiting blood [x]  blood in stool (occult)  GU: [x]  CKD III/renal failure []  HD--[]  M/W/F or []  T/T/S []  burning with urination []  blood in urine  Psychiatric: []  anxiety []  depression  Musculoskeletal: []  arthritis []  joint pain  Integumentary: []  rashes []  ulcers  Constitutional: []  fever []  chills   Physical Examination  Vitals:   11/20/16 0846 11/20/16 1146  BP:  96/63  Pulse:  90  Resp:  18  Temp: 98 F (36.7 C) 97.8 F (36.6 C)   Body mass index is 22.77 kg/m.  General: NAD Gait: Not observed HENT: WNL, normocephalic Pulmonary: normal non-labored breathing Cardiac: irregular Abdomen:  soft, NT/ND, no masses Extremities: without ischemic changes, without Gangrene , without cellulitis; without open wounds; LLE edema Musculoskeletal: no muscle wasting or atrophy  Neurologic:  not oriented to place or time   CBC    Component Value Date/Time   WBC 8.2 11/20/2016 1244   RBC 3.33 (L) 11/20/2016 1244   HGB 8.0 (L) 11/20/2016 1244   HCT 26.8 (L) 11/20/2016 1244   PLT 260 11/20/2016 1244   MCV 80.5 11/20/2016 1244   MCH 24.0 (L) 11/20/2016 1244   MCHC 29.9 (L) 11/20/2016 1244   RDW 15.8 (H) 11/20/2016 1244   LYMPHSABS 0.8 11/20/2016 1244   MONOABS 0.7 11/20/2016 1244   EOSABS 0.0 11/20/2016 1244   BASOSABS 0.0 11/20/2016 1244    BMET    Component Value Date/Time   NA 135 11/20/2016 1244   K 4.5 11/20/2016 1244   CL 101 11/20/2016 1244   CO2 26 11/20/2016 1244   GLUCOSE 102 (H) 11/20/2016 1244   BUN 16 11/20/2016 1244   CREATININE 1.27 (H) 11/20/2016 1244   CREATININE 1.84 (H) 05/02/2016 1554   CALCIUM 8.5 (L) 11/20/2016 1244   GFRNONAA 36 (L)  11/20/2016 1244   GFRAA 41 (L) 11/20/2016 1244    COAGS: Lab Results  Component Value Date   INR 1.12 11/19/2016   INR 3.8 01/14/2013   INR 4.1 01/04/2013     Non-Invasive Vascular Imaging:   Vascular Ultrasound 11/20/16 Bilateral lower extremity venous duplex has been completed.  Preliminary findings: Left lower extremity appears positive for deep vein thrombosis from the left common femoral vein throughout the extremity.  Attempted to visualize left iliac vein and IVC. Left iliac appears thrombosed, evaluation of IVC is non diagnostic due to poor  patient position, cooperation, bowel and gas, and tolerance of exam.  Right lower extremity appears negative for deep vein thrombosis. Incidental finding: Upon evaluating the Iliac veins an echogenic focus was noted in the bladder, unknown etiology.  CT Scan Abdomen/Pelvis 11/20/16: IMPRESSION: 1. Large amount of subcutaneous and soft tissue edema extending from the left lateral chest wall, inferiorly over the left hip. There is diffuse soft tissue edema surrounding the left hip musculature and involving the proximal left thigh. 2. There is no CT evidence for acute intra-abdominal or pelvic pathology 3. Nonobstructing stone or intrarenal vascular calcification right kidney. 4. Sigmoid colon diverticular disease without acute inflammation 5. Moderate hiatal hernia 6. Trace bilateral effusions with linear atelectasis at the left base. 7. 1.5 cm right adrenal gland adenoma   Statin:  No. Beta Blocker:  No. Aspirin:  No. ACEI:  No. ARB:  No. CCB use:  No Other antiplatelets/anticoagulants:  No. (Previously on Eliquis)   ASSESSMENT/PLAN: This is a 83 y.o. female with swollen LLE now with DVT from the left CFV throughout the LLE.   -pt with hx of Afib  And was previously on Eliquis, which was discontinued to anemia and +FOBT.   -Dr. 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   -given risks vs benefits of anticoagulation, recommend starting heparin gtt without bolus and checking serial H&H's.  If she does not experience bleeding, recommend coumadin long term as this can easily be reversed if needed.  If she does experience bleeding, she would need to have an IVC filter placed.  Risks/benefits of this were discussed with the pt's daughter.   -may benefit from palliative consult   Doreatha MassedSamantha Rhyne, PA-C [facility] [phone]   I have independently interviewed patient and agree with PA assessment and plan above. As above, discussed with patient and power of attorney. Not a good candidate for intervention of left leg extensive dvt. Ok for anticoagulation from general surgical standpoint and should start with heparin with serial h/h to monitor for further bleeding. If she does not tolerate AC we can place ivc filter.   Harith Mccadden C. Randie Heinzain, MD [facility] [place] Office: [phone] Pager: [phone]

## 2016-11-21 ENCOUNTER — Inpatient Hospital Stay (HOSPITAL_COMMUNITY): Payer: Medicare Other

## 2016-11-21 DIAGNOSIS — I82412 Acute embolism and thrombosis of left femoral vein: Secondary | ICD-10-CM

## 2016-11-21 DIAGNOSIS — I82409 Acute embolism and thrombosis of unspecified deep veins of unspecified lower extremity: Secondary | ICD-10-CM | POA: Diagnosis present

## 2016-11-21 DIAGNOSIS — Z515 Encounter for palliative care: Secondary | ICD-10-CM

## 2016-11-21 DIAGNOSIS — F329 Major depressive disorder, single episode, unspecified: Secondary | ICD-10-CM

## 2016-11-21 LAB — RPR: RPR: NONREACTIVE

## 2016-11-21 LAB — FOLATE RBC
Folate, Hemolysate: 339.1 ng/mL
Folate, RBC: 1289 ng/mL (ref >498)
Hematocrit: 26.3 % — ABNORMAL LOW (ref 34.0–46.6)

## 2016-11-21 LAB — BLOOD CULTURE ID PANEL (REFLEXED)
ACINETOBACTER BAUMANNII: NOT DETECTED
CANDIDA ALBICANS: NOT DETECTED
CANDIDA GLABRATA: NOT DETECTED
CANDIDA KRUSEI: NOT DETECTED
Candida parapsilosis: NOT DETECTED
Candida tropicalis: NOT DETECTED
ENTEROBACTER CLOACAE COMPLEX: NOT DETECTED
ENTEROBACTERIACEAE SPECIES: NOT DETECTED
ENTEROCOCCUS SPECIES: NOT DETECTED
ESCHERICHIA COLI: NOT DETECTED
HAEMOPHILUS INFLUENZAE: NOT DETECTED
KLEBSIELLA OXYTOCA: NOT DETECTED
Klebsiella pneumoniae: NOT DETECTED
LISTERIA MONOCYTOGENES: NOT DETECTED
Neisseria meningitidis: NOT DETECTED
Proteus species: NOT DETECTED
Pseudomonas aeruginosa: NOT DETECTED
STREPTOCOCCUS PYOGENES: NOT DETECTED
STREPTOCOCCUS SPECIES: NOT DETECTED
Serratia marcescens: NOT DETECTED
Staphylococcus aureus (BCID): NOT DETECTED
Staphylococcus species: NOT DETECTED
Streptococcus agalactiae: NOT DETECTED
Streptococcus pneumoniae: NOT DETECTED

## 2016-11-21 LAB — HEPARIN LEVEL (UNFRACTIONATED)

## 2016-11-21 MED ORDER — ESCITALOPRAM OXALATE 10 MG PO TABS
10.0000 mg | ORAL_TABLET | Freq: Every day | ORAL | Status: DC
  Administered 2016-11-21 – 2016-11-23 (×3): 10 mg via ORAL
  Filled 2016-11-21 (×3): qty 1

## 2016-11-21 MED ORDER — HEPARIN (PORCINE) IN NACL 100-0.45 UNIT/ML-% IJ SOLN
800.0000 [IU]/h | INTRAMUSCULAR | Status: DC
  Administered 2016-11-22: 900 [IU]/h via INTRAVENOUS
  Administered 2016-11-23: 800 [IU]/h via INTRAVENOUS
  Filled 2016-11-21 (×3): qty 250

## 2016-11-21 MED ORDER — SODIUM CHLORIDE 0.9 % IV BOLUS (SEPSIS)
250.0000 mL | Freq: Once | INTRAVENOUS | Status: AC
  Administered 2016-11-21: 250 mL via INTRAVENOUS

## 2016-11-21 MED ORDER — DEXTROSE 5 % IV SOLN
1.0000 g | Freq: Two times a day (BID) | INTRAVENOUS | Status: DC
  Administered 2016-11-21 – 2016-11-22 (×4): 1 g via INTRAVENOUS
  Filled 2016-11-21 (×5): qty 1

## 2016-11-21 MED ORDER — HEPARIN (PORCINE) IN NACL 100-0.45 UNIT/ML-% IJ SOLN
800.0000 [IU]/h | INTRAMUSCULAR | Status: DC
  Administered 2016-11-21: 800 [IU]/h via INTRAVENOUS

## 2016-11-21 MED ORDER — HALOPERIDOL LACTATE 5 MG/ML IJ SOLN: 2.0000 mg | Freq: Four times a day (QID) | INTRAMUSCULAR | Status: DC | PRN

## 2016-11-21 MED ORDER — LORAZEPAM 2 MG/ML IJ SOLN
0.5000 mg | Freq: Three times a day (TID) | INTRAMUSCULAR | Status: DC | PRN
  Administered 2016-11-21 – 2016-11-22 (×2): 0.5 mg via INTRAVENOUS
  Filled 2016-11-21 (×2): qty 1

## 2016-11-21 MED ORDER — MIRTAZAPINE 7.5 MG PO TABS
15.0000 mg | ORAL_TABLET | Freq: Every day | ORAL | Status: DC
  Administered 2016-11-21 – 2016-11-23 (×3): 15 mg via ORAL
  Filled 2016-11-21 (×3): qty 2

## 2016-11-21 MED ORDER — HEPARIN SODIUM (PORCINE) 5000 UNIT/ML IJ SOLN: 5000.0000 [IU] | Freq: Three times a day (TID) | INTRAMUSCULAR | Status: DC

## 2016-11-21 NOTE — Progress Notes (Signed)
ANTICOAGULATION CONSULT NOTE - Follow Up Consult  Pharmacy Consult for Heparin Indication: DVT  extensive LLE  Allergies  Allergen Reactions  . Penicillins Swelling    Has patient had a PCN reaction causing immediate rash, facial/tongue/throat swelling, SOB or lightheadedness with hypotension: Yes Has patient had a PCN reaction causing severe rash involving mucus membranes or skin necrosis: No Has patient had a PCN reaction that required hospitalization unknown Has patient had a PCN reaction occurring within the last 10 years: No If all of the above answers are "NO", then may proceed with Cephalosporin use.  . Sulfa Antibiotics Hives and Swelling    Patient Measurements: Height: 5' (152.4 cm) (pt. unaware of height, height from previous admission) Weight: 116 lb 9.6 oz (52.9 kg) IBW/kg (Calculated) : 45.5 kg   Vital Signs: Temp: 98 F (36.7 C) (05/04 1731) Temp Source: Axillary (05/04 1731) BP: 144/76 (05/04 1627) Pulse Rate: 105 (05/04 1225)  Labs:  Recent Labs  11/19/16 1940 11/20/16 0202 11/20/16 0707 11/20/16 1244 11/20/16 1604 11/21/16 1807  HGB 8.6*  --   --  8.0*  --   --   HCT 29.2*  --   --  26.8*  26.3*  --   --   PLT 324  --   --  260  --   --   APTT 24  --   --   --   --   --   LABPROT 14.4  --   --   --   --   --   INR 1.12  --   --   --   --   --   HEPARINUNFRC  --   --   --   --   --  <0.10*  CREATININE 1.67*  --   --  1.27*  --   --   CKTOTAL  --   --   --   --  20*  --   TROPONINI  --  <0.03 <0.03 <0.03  --   --     Estimated Creatinine Clearance: 20.7 mL/min (A) (by C-G formula based on SCr of 1.27 mg/dL (H)).   Assessment: 83 year old female restarting heparin this AM for extensive LLE DVT, stopped last night for low HgB.flank hematoma with recent + fobt.   Heparin drip resumed this AM.  The 8 hour heparin level is <0.1, SUBtherapeutic on rate of 800 units/hr.  RN is not aware of any issues with the IV site or heparin infusion. RN will check  line again and call me if notices any problem with IV line. No new bleeding noted.  Goal of Therapy:  Heparin level 0.3-0.7 units/ml Monitor platelets by anticoagulation protocol: Yes   Plan:  Increase heparin to 900 units / hr Heparin level 8 hours with 5AM labs Daily heparin level, CBC   Noah Delaineuth Versia Mignogna, RPh Clinical Pharmacist Pager: [phone] 11/21/2016,8:26 PM

## 2016-11-21 NOTE — Progress Notes (Signed)
Pt has refused morning labs to be drawn as well as morning vitals to be taken. Becomes combative and starts to yell "leave me alone"  Will try again later and will continue to monitor.

## 2016-11-21 NOTE — Progress Notes (Signed)
PHARMACY - PHYSICIAN COMMUNICATION CRITICAL VALUE ALERT - BLOOD CULTURE IDENTIFICATION (BCID)  Results for orders placed or performed during the hospital encounter of 11/19/16  Blood Culture ID Panel (Reflexed) (Collected: 11/19/2016  9:50 PM)  Result Value Ref Range   Enterococcus species NOT DETECTED NOT DETECTED   Listeria monocytogenes NOT DETECTED NOT DETECTED   Staphylococcus species NOT DETECTED NOT DETECTED   Staphylococcus aureus NOT DETECTED NOT DETECTED   Streptococcus species NOT DETECTED NOT DETECTED   Streptococcus agalactiae NOT DETECTED NOT DETECTED   Streptococcus pneumoniae NOT DETECTED NOT DETECTED   Streptococcus pyogenes NOT DETECTED NOT DETECTED   Acinetobacter baumannii NOT DETECTED NOT DETECTED   Enterobacteriaceae species NOT DETECTED NOT DETECTED   Enterobacter cloacae complex NOT DETECTED NOT DETECTED   Escherichia coli NOT DETECTED NOT DETECTED   Klebsiella oxytoca NOT DETECTED NOT DETECTED   Klebsiella pneumoniae NOT DETECTED NOT DETECTED   Proteus species NOT DETECTED NOT DETECTED   Serratia marcescens NOT DETECTED NOT DETECTED   Haemophilus influenzae NOT DETECTED NOT DETECTED   Neisseria meningitidis NOT DETECTED NOT DETECTED   Pseudomonas aeruginosa NOT DETECTED NOT DETECTED   Candida albicans NOT DETECTED NOT DETECTED   Candida glabrata NOT DETECTED NOT DETECTED   Candida krusei NOT DETECTED NOT DETECTED   Candida parapsilosis NOT DETECTED NOT DETECTED   Candida tropicalis NOT DETECTED NOT DETECTED    Name of physician (or Provider) Contacted: Dr. Janee Mornhompson  Changes to prescribed antibiotics required:  Continue Azactam for PNA    Bach Rocchi D. Laney Potashang, PharmD, BCPS Pager:  [phone] 11/21/2016, 4:06 PM

## 2016-11-21 NOTE — Progress Notes (Signed)
PROGRESS NOTE    Melanie Blake  ZOX:[id number] DOB: [birth year] DOA: 11/19/2016 PCP: [facility]    Brief Narrative:  Patient is a 83 year old female history significant for dementia, GI bleed, chronic kidney disease stage III, atrial fibrillation not on anticoagulation, hypothyroidism, depression who was recently hospitalized from 10/27/2016-10/28/2016 secondary to a fall with generalized weakness and dizziness. Patient noted during the hospitalization to have low normal blood pressure and a such patient's Cardizem Lasix and metoprolol were discontinued on discharge. Patient also noted to have swelling in the lower extremity as well as soft tissue swelling with a recent drop in hemoglobin and a such patient's anticoagulation was discontinued on discharge. Patient presented back to the ED with altered mental status and fall and noted to be in A. fib with RVR with heart rates in the 160s to the 200s. Patient placed on a Cardizem drip. Patient also noted to have a leukocytosis and chest x-ray with subtle opacity in the right middle lung lobe and a such patient placed on antibiotics due to hypotension for probable healthcare associated pneumonia.   Assessment & Plan:   Principal Problem:   Atrial fibrillation with RVR (HCC) Active Problems:   Dvt femoral (deep venous thrombosis) (HCC): Common femeral vein and throughout LLE   Chronic diastolic CHF (congestive heart failure) (HCC)   Stage 3 chronic kidney disease   Abdominal tenderness   Hyperkalemia   Acute encephalopathy   Depression   HCAP (healthcare-associated pneumonia)   Abnormal CT of the abdomen   #1 A. fib with RVR CHA2DS2VASC 4 Patient noted on presentation to be in A. fib with RVR. Patient on Cardizem drip with heart rates noted this morning to be in the 100s however improving. Patient's A. fib with RVR likely secondary to discontinuation of her rate limiting medications during last hospitalization. Cardiac enzymes  have been negative 2. 2-D echo pending. TSH at 1.907. Continue Cardizem drip. Follow. Cardiology has been consulted as patient was noted to have soft blood pressures. Appreciate input and recommendation.  #2 chronic diastolic heart failure Stable. Patient seems compensated. Cardiology following.  #3 probable HCAP On admission patient noted to have a leukocytosis was tachycardic and chest x-ray with subtle opacity in the right middle lung lobe. Urine Legionella antigen pending urine pneumococcus antigen pending. Blood cultures pending. Sputum Gram stain and culture pending. Patient placed empirically on IV antibiotics for probable healthcare associated pneumonia. Will discontinue IV vancomycin and IV Levaquin. Place on IV Azactam.  #4 acute encephalopathy ?? Etiology. Patient with a baseline dementia. Will be multifactorial secondary to A. fib with RVR, possible pneumonia and electrolyte disturbance. CT head negative for any acute abnormalities. Continue empiric IV antibiotics. Continue to manage atrial fibrillation.  #5 hyperkalemia On admission potassium was 5.6. EKG with no peak T waves. Labs pending.  #6 depression Stable. Resume Lexapro and Remeron.   #7 hypothyroidism Continue IV Synthroid.  #8 dysphagia Per nursing patient choked while trying to drink tea this morning. Continue current dysphagia diet. Speech therapy evaluation.  #9 chronic kidney disease stage III Baseline creatinine 1.4-1.8. Labs pending this morning. Labs on admission showed chronic kidney disease was stable.  #10 abdominal tenderness Patient with no further abdominal tenderness this morning. Lipase levels within normal limits. CT abdomen and pelvis with a large amount of subcutaneous and soft tissue edema extending from the left lateral chest wall, inferiorly over the left hip. Diffuse soft tissue edema surrounding the left hip musculature and involving the left proximal thigh. No  CT evidence of acute  intra-abdominal or pelvic pathology.  #11 abnormal CT findings Secondary to fall. CT abdomen and pelvis with a large amount of subcutaneous and soft tissue edema extending from the left lateral chest wall, inferiorly over the left hip. Diffuse soft tissue edema surrounding the left hip musculature and involving the left proximal thigh. No CT evidence of acute intra-abdominal or pelvic pathology. Patient was seen in consultation by trauma surgery who recommended monitoring for now.  #12 Extensive left lower extremity DVT Patient underwent lower extremity Dopplers which showed an extensive left lower extremity DVT from the left, femoral vein throughout the extremity. Left iliac appear thrombosed. Consulted vascular surgery, Dr. Randie Heinzain who came and assessed the patient and in discussion with Dr. Violeta GelinasBurke Thompson of trauma surgery recommended patient be placed on anticoagulation with IV heparin with close monitoring of hematoma and following patient's H&H. If patient's hemoglobin drops then will need to discontinue IV heparin and place IVC filter. Patient was started on heparin drip however this was discontinued by cardiology yesterday however has been subsequently resumed today. Patient refusing lab draws. Monitor closely for worsening hematoma or bleeding. Appreciate vascular surgery input and, surgery input. Updated and spoke with patient's niece who is the healthcare power of attorney yesterday 11/20/2016.   #12 dehydration IV fluids.  #13 fall Patient with fall 3 weeks ago with left leg injury with no bony fracture on the leg. Patient with no head injury. CT head and neck negative for any acute abnormalities. Lower extremity Dopplers consistent with extensive large left lower extremity DVT.  #13 dementia Patient noted to have a baseline dementia however patient noted to be very combative and agitated this morning. Patient was given some IV Ativan. Will discontinue IV Levaquin. Place on Haldol as  needed.     DVT prophylaxis: SCDs Code Status: DO NOT RESUSCITATE Family Communication: Updated patient. No family at bedside.  Disposition Plan: Likely back to assisted living facility versus skilled nursing facility once medically stable, A. fib with RVR resolved and blood pressure stable.   Consultants:   Vascular surgery: Dr. Randie Heinzain 11/20/2016  Cardiology: Dr. Mayford Knifeurner 11/20/2016  Trauma: Dr. Janee Mornhompson 11/20/2016  Procedures:   CT abdomen and pelvis 11/20/2016  CT head CT C-spine 11/20/2016  Chest x-ray 11/19/2016  Plain films of Left tib-fib 11/19/2016  Lower extremity Dopplers 11/20/2016  Antimicrobials:   IV vancomycin 11/19/2016>>>> 11/21/2016  IV Levaquin 11/19/2016>>>> 11/21/2016  IV Azactam 11/19/2016>>> 11/20/2016  IV Azactam 11/21/2016   Subjective: Patient laying in bed. Patient denies any chest pain. No shortness of breath. Patient oriented to self only. Patient c/o of pain around BP CUFF SITE. Patient noted to be agitated and combative this morning. Per nursing patient also confused and refusing blood draws.  Objective: Vitals:   11/20/16 1146 11/20/16 1644 11/20/16 2054 11/21/16 1225  BP: 96/63 140/69 139/79   Pulse: 90 (!) 126 (!) 114 (!) 105  Resp: 18 (!) 26 (!) 22   Temp: 97.8 F (36.6 C)  98.6 F (37 C) 97.8 F (36.6 C)  TempSrc: Axillary  Oral Oral  SpO2: 98% 98% 97% 95%  Weight:      Height:        Intake/Output Summary (Last 24 hours) at 11/21/16 1249 Last data filed at 11/21/16 0600  Gross per 24 hour  Intake          1373.75 ml  Output                0 ml  Net          1373.75 ml   Filed Weights   11/20/16 0159  Weight: 52.9 kg (116 lb 9.6 oz)    Examination:  General exam: Appears calm and comfortable.Severe scoliotic kyphosis. Extremely dry mucous membranes. Respiratory system: Clear to auscultation. Respiratory effort normal. Cardiovascular system: Irregularly irregular. Grade 3/6 systolic ejection murmur. No JVD,  murmurs, rubs, gallops or clicks. Left lower extremity tight and swollen with ecchymosis and likely hematoma. Gastrointestinal system: Abdomen is nondistended, soft and nontender. No organomegaly or masses felt. Normal bowel sounds heard. Central nervous system: Alert and oriented to self only. No focal neurological deficits. Extremities:" Left lower extremity tight, swollen, significant ecchymosis and probable hematoma. Skin: Patient with diffuse ecchymosis on extremities. Psychiatry: Judgement and insight appear poor to fair. Mood & affect appropriate.     Data Reviewed: I have personally reviewed following labs and imaging studies  CBC:  Recent Labs Lab 11/19/16 1940 11/20/16 1244  WBC 13.6* 8.2  NEUTROABS 12.0* 6.7  HGB 8.6* 8.0*  HCT 29.2* 26.8*  MCV 81.8 80.5  PLT 324 260   Basic Metabolic Panel:  Recent Labs Lab 11/19/16 1940 11/20/16 1244  NA 135 135  K 5.6* 4.5  CL 99* 101  CO2 23 26  GLUCOSE 102* 102*  BUN 19 16  CREATININE 1.67* 1.27*  CALCIUM 9.2 8.5*  MG  --  2.2   GFR: Estimated Creatinine Clearance: 20.7 mL/min (A) (by C-G formula based on SCr of 1.27 mg/dL (H)). Liver Function Tests:  Recent Labs Lab 11/19/16 1940  AST 27  ALT 12*  ALKPHOS 109  BILITOT 1.0  PROT 6.9  ALBUMIN 3.0*    Recent Labs Lab 11/20/16 0011  LIPASE 23    Recent Labs Lab 11/20/16 1244  AMMONIA 17   Coagulation Profile:  Recent Labs Lab 11/19/16 1940  INR 1.12   Cardiac Enzymes:  Recent Labs Lab 11/20/16 0202 11/20/16 0707 11/20/16 1244 11/20/16 1604  CKTOTAL  --   --   --  20*  TROPONINI <0.03 <0.03 <0.03  --    BNP (last 3 results) No results for input(s): PROBNP in the last 8760 hours. HbA1C: No results for input(s): HGBA1C in the last 72 hours. CBG: No results for input(s): GLUCAP in the last 168 hours. Lipid Profile: No results for input(s): CHOL, HDL, LDLCALC, TRIG, CHOLHDL, LDLDIRECT in the last 72 hours. Thyroid Function  Tests:  Recent Labs  11/19/16 2100  TSH 1.907   Anemia Panel:  Recent Labs  11/20/16 1244  VITAMINB12 673   Sepsis Labs:  Recent Labs Lab 11/19/16 2210  LATICACIDVEN 1.18    Recent Results (from the past 240 hour(s))  Culture, blood (Routine X 2) w Reflex to ID Panel     Status: None (Preliminary result)   Collection Time: 11/19/16  9:50 PM  Result Value Ref Range Status   Specimen Description BLOOD LEFT ANTECUBITAL  Final   Special Requests   Final    BOTTLES DRAWN AEROBIC AND ANAEROBIC Blood Culture adequate volume   Culture NO GROWTH < 24 HOURS  Final   Report Status PENDING  Incomplete  Blood Culture ID Panel (Reflexed)     Status: None   Collection Time: 11/19/16  9:50 PM  Result Value Ref Range Status   Enterococcus species NOT DETECTED NOT DETECTED Final   Listeria monocytogenes NOT DETECTED NOT DETECTED Final   Staphylococcus species NOT DETECTED NOT DETECTED Final   Staphylococcus aureus NOT DETECTED NOT  DETECTED Final   Streptococcus species NOT DETECTED NOT DETECTED Final   Streptococcus agalactiae NOT DETECTED NOT DETECTED Final   Streptococcus pneumoniae NOT DETECTED NOT DETECTED Final   Streptococcus pyogenes NOT DETECTED NOT DETECTED Final   Acinetobacter baumannii NOT DETECTED NOT DETECTED Final   Enterobacteriaceae species NOT DETECTED NOT DETECTED Final   Enterobacter cloacae complex NOT DETECTED NOT DETECTED Final   Escherichia coli NOT DETECTED NOT DETECTED Final   Klebsiella oxytoca NOT DETECTED NOT DETECTED Final   Klebsiella pneumoniae NOT DETECTED NOT DETECTED Final   Proteus species NOT DETECTED NOT DETECTED Final   Serratia marcescens NOT DETECTED NOT DETECTED Final   Haemophilus influenzae NOT DETECTED NOT DETECTED Final   Neisseria meningitidis NOT DETECTED NOT DETECTED Final   Pseudomonas aeruginosa NOT DETECTED NOT DETECTED Final   Candida albicans NOT DETECTED NOT DETECTED Final   Candida glabrata NOT DETECTED NOT DETECTED Final    Candida krusei NOT DETECTED NOT DETECTED Final   Candida parapsilosis NOT DETECTED NOT DETECTED Final   Candida tropicalis NOT DETECTED NOT DETECTED Final  Culture, blood (Routine X 2) w Reflex to ID Panel     Status: None (Preliminary result)   Collection Time: 11/19/16  9:55 PM  Result Value Ref Range Status   Specimen Description BLOOD  Final   Special Requests   Final    BOTTLES DRAWN AEROBIC ONLY Blood Culture adequate volume   Culture NO GROWTH < 24 HOURS  Final   Report Status PENDING  Incomplete         Radiology Studies: Ct Abdomen Pelvis Wo Contrast  Result Date: 11/20/2016 CLINICAL DATA:  Abdominal pain with left leg swelling EXAM: CT ABDOMEN AND PELVIS WITHOUT CONTRAST TECHNIQUE: Multidetector CT imaging of the abdomen and pelvis was performed following the standard protocol without IV contrast. COMPARISON:  05/11/2016 FINDINGS: Lower chest: Trace bilateral effusions with streaky atelectasis at the left lung base. Mild cardiomegaly. Moderate hiatal hernia Hepatobiliary: Stable 2 cm cyst in the dome of the right lobe. No biliary dilatation. Gallbladder not well visualized and may be contracted. Pancreas: Atrophic.  No surrounding inflammation. Spleen: Normal in size without focal abnormality. Adrenals/Urinary Tract: Stable 15 mm low-attenuation mass in the right adrenal consistent with adenoma. Left adrenal gland within normal limits. Nonobstructing stone or intrarenal vascular calcification right kidney. Bladder normal Stomach/Bowel: The stomach is nonenlarged. No dilated small bowel. No colon wall thickening. Sigmoid colon diverticula without acute inflammation Vascular/Lymphatic: Aortic atherosclerosis. No enlarged abdominal or pelvic lymph nodes. Reproductive: Uterus and bilateral adnexa are unremarkable. Other: No free air or free fluid. Large amount of subcutaneous edema within the left lateral posterior subcutaneous fat that extends inferiorly along the left lateral lower chest  wall and abdominal wall. This continues inferiorly over the left hip and pelvis. There is diffuse edema surrounding the left hip muscles with moderate diffuse edema in the proximal left thigh. Musculoskeletal: Degenerative changes of the spine. No acute or suspicious bone lesion. IMPRESSION: 1. Large amount of subcutaneous and soft tissue edema extending from the left lateral chest wall, inferiorly over the left hip. There is diffuse soft tissue edema surrounding the left hip musculature and involving the proximal left thigh. 2. There is no CT evidence for acute intra-abdominal or pelvic pathology 3. Nonobstructing stone or intrarenal vascular calcification right kidney. 4. Sigmoid colon diverticular disease without acute inflammation 5. Moderate hiatal hernia 6. Trace bilateral effusions with linear atelectasis at the left base. 7. 1.5 cm right adrenal gland adenoma Electronically Signed  By: Jasmine Pang M.D.   On: 11/20/2016 02:16   Dg Tibia/fibula Left  Result Date: 11/19/2016 CLINICAL DATA:  Pain and swelling EXAM: LEFT TIBIA AND FIBULA - 2 VIEW COMPARISON:  None. FINDINGS: Diffuse subcutaneous edema is identified. No fracture or dislocation is seen. Mild osteopenia is seen. IMPRESSION: Soft tissue swelling without acute bony abnormality. Electronically Signed   By: Alcide Clever M.D.   On: 11/19/2016 19:09   Ct Head Wo Contrast  Result Date: 11/20/2016 CLINICAL DATA:  Altered level of consciousness following a fall 3 weeks ago EXAM: CT HEAD WITHOUT CONTRAST CT CERVICAL SPINE WITHOUT CONTRAST TECHNIQUE: Multidetector CT imaging of the head and cervical spine was performed following the standard protocol without intravenous contrast. Multiplanar CT image reconstructions of the cervical spine were also generated. COMPARISON:  10/27/2016 FINDINGS: CT HEAD FINDINGS Brain: No acute territorial infarction, hemorrhage or intracranial mass. Moderate atrophy. Old lacunar infarct left basal ganglia. Mild to  moderate periventricular small vessel ischemic changes. Stable ventricle size Vascular: No hyperdense vessels.  Carotid artery calcification Skull: No fracture or suspicious bone lesion Sinuses/Orbits: No acute finding. Other: None CT CERVICAL SPINE FINDINGS Alignment: Exaggerated lordosis. Minimal retrolisthesis of C3 on C4 and C4 on C5. Facets maintain alignment. Skull base and vertebrae: Craniovertebral junction is intact. No fracture. Soft tissues and spinal canal: No prevertebral fluid or swelling. No visible canal hematoma. Disc levels: Moderate degenerative disc changes at C4-C5. Multilevel facet arthropathy results in multilevel foraminal narrowing. Upper chest: No acute abnormality at the lung apices. Carotid artery calcification. Other: None IMPRESSION: 1. No CT evidence for acute intracranial abnormality. Atrophy and small vessel ischemic changes 2. Exaggerated cervical lordosis.  No fracture visualized. Electronically Signed   By: Jasmine Pang M.D.   On: 11/20/2016 02:00   Ct Cervical Spine Wo Contrast  Result Date: 11/20/2016 CLINICAL DATA:  Altered level of consciousness following a fall 3 weeks ago EXAM: CT HEAD WITHOUT CONTRAST CT CERVICAL SPINE WITHOUT CONTRAST TECHNIQUE: Multidetector CT imaging of the head and cervical spine was performed following the standard protocol without intravenous contrast. Multiplanar CT image reconstructions of the cervical spine were also generated. COMPARISON:  10/27/2016 FINDINGS: CT HEAD FINDINGS Brain: No acute territorial infarction, hemorrhage or intracranial mass. Moderate atrophy. Old lacunar infarct left basal ganglia. Mild to moderate periventricular small vessel ischemic changes. Stable ventricle size Vascular: No hyperdense vessels.  Carotid artery calcification Skull: No fracture or suspicious bone lesion Sinuses/Orbits: No acute finding. Other: None CT CERVICAL SPINE FINDINGS Alignment: Exaggerated lordosis. Minimal retrolisthesis of C3 on C4 and C4  on C5. Facets maintain alignment. Skull base and vertebrae: Craniovertebral junction is intact. No fracture. Soft tissues and spinal canal: No prevertebral fluid or swelling. No visible canal hematoma. Disc levels: Moderate degenerative disc changes at C4-C5. Multilevel facet arthropathy results in multilevel foraminal narrowing. Upper chest: No acute abnormality at the lung apices. Carotid artery calcification. Other: None IMPRESSION: 1. No CT evidence for acute intracranial abnormality. Atrophy and small vessel ischemic changes 2. Exaggerated cervical lordosis.  No fracture visualized. Electronically Signed   By: Jasmine Pang M.D.   On: 11/20/2016 02:00   Dg Chest Port 1 View  Result Date: 11/19/2016 CLINICAL DATA:  Dyspnea and dementia EXAM: PORTABLE CHEST 1 VIEW COMPARISON:  10/27/2016 FINDINGS: Cardiomegaly is stable. Tortuous atherosclerotic aorta is likewise unchanged. The patient's chin obscures the apices more so on the right. Subtle and airspace opacity is noted in the right mid lung since prior exam, some of which  is due to overlapping ribs and scapula but findings are suspicious for an evolving pneumonia. Left basilar scarring is noted. Clinical correlation is suggested. Old left-sided lower rib fractures are stable. IMPRESSION: 1. Stable cardiomegaly with aortic atherosclerosis. 2. Subtle opacity in the right mid lung is suspicious for an evolving pneumonia although some of this appearance is due to overlapping ribs and scapula. Electronically Signed   By: Tollie Eth M.D.   On: 11/19/2016 20:45        Scheduled Meds: . escitalopram  10 mg Oral QHS  . levothyroxine  25 mcg Intravenous Daily  . mouth rinse  15 mL Mouth Rinse BID  . mirtazapine  15 mg Oral QHS   Continuous Infusions: . diltiazem (CARDIZEM) infusion 5 mg/hr (11/20/16 0328)  . heparin 800 Units/hr (11/21/16 0900)  . [START ON 11/22/2016] vancomycin       LOS: 2 days    Time spent: 35 minutes    THOMPSON,DANIEL,  MD [facility] Pager [phone]  If 7PM-7AM, please contact night-coverage [email or web address] Password [email or web address] 11/21/2016, 12:49 PM

## 2016-11-21 NOTE — Progress Notes (Signed)
Patient has not voided thus far this shift.  Bladder scan completed and was .  IV fluid infusing per MD order.  [facility] text paged via Amion with this information.

## 2016-11-21 NOTE — Progress Notes (Signed)
The patient became combative this morning when the RN tried to assess her stating, "I told you not to touch me." She is putting both legs over the bed side rails stating, "I've got to get up." She was given PRN ativan. Able to reapply heart monitor leads at this time. Will continue to monitor her closely.   Sheppard Melanie Detrell Umscheid RN

## 2016-11-21 NOTE — Progress Notes (Addendum)
Progress Note  Patient Name: Melanie Blake Date of Encounter: 11/21/2016  Primary Cardiologist: Dr. Mayford Knife  Subjective   Very combative this am and refused to talk to me or let me examine her  Inpatient Medications    Scheduled Meds: . levothyroxine  25 mcg Intravenous Daily  . mouth rinse  15 mL Mouth Rinse BID   Continuous Infusions: . sodium chloride 75 mL/hr at 11/21/16 0443  . diltiazem (CARDIZEM) infusion 5 mg/hr (11/20/16 0328)  . levofloxacin (LEVAQUIN) IV    . [START ON 11/22/2016] vancomycin     PRN Meds: acetaminophen, albuterol, ondansetron   Vital Signs    Vitals:   11/20/16 0846 11/20/16 1146 11/20/16 1644 11/20/16 2054  BP:  96/63 140/69 139/79  Pulse:  90 (!) 126 (!) 114  Resp:  18 (!) 26 (!) 22  Temp: 98 F (36.7 C) 97.8 F (36.6 C)  98.6 F (37 C)  TempSrc: Oral Axillary  Oral  SpO2: 96% 98% 98% 97%  Weight:      Height:        Intake/Output Summary (Last 24 hours) at 11/21/16 0706 Last data filed at 11/21/16 0600  Gross per 24 hour  Intake          1373.75 ml  Output                0 ml  Net          1373.75 ml   Filed Weights   11/20/16 0159  Weight: 116 lb 9.6 oz (52.9 kg)    Telemetry    Atrial fibrillation  - Personally Reviewed  ECG    No new EKG to review  Physical Exam   Patient very combative and beligerant and refused to let me examine her.  Tried to hit me.   Labs    Chemistry Recent Labs Lab 11/19/16 1940 11/20/16 1244  NA 135 135  K 5.6* 4.5  CL 99* 101  CO2 23 26  GLUCOSE 102* 102*  BUN 19 16  CREATININE 1.67* 1.27*  CALCIUM 9.2 8.5*  PROT 6.9  --   ALBUMIN 3.0*  --   AST 27  --   ALT 12*  --   ALKPHOS 109  --   BILITOT 1.0  --   GFRNONAA 26* 36*  GFRAA 30* 41*  ANIONGAP 13 8     Hematology Recent Labs Lab 11/19/16 1940 11/20/16 1244  WBC 13.6* 8.2  RBC 3.57* 3.33*  HGB 8.6* 8.0*  HCT 29.2* 26.8*  MCV 81.8 80.5  MCH 24.1* 24.0*  MCHC 29.5* 29.9*  RDW 16.0* 15.8*  PLT 324 260      Cardiac Enzymes Recent Labs Lab 11/20/16 0202 11/20/16 0707 11/20/16 1244  TROPONINI <0.03 <0.03 <0.03    Recent Labs Lab 11/19/16 1944  TROPIPOC 0.01     BNP Recent Labs Lab 11/19/16 1940  BNP 154.8*     DDimer No results for input(s): DDIMER in the last 168 hours.   Radiology    Ct Abdomen Pelvis Wo Contrast  Result Date: 11/20/2016 CLINICAL DATA:  Abdominal pain with left leg swelling EXAM: CT ABDOMEN AND PELVIS WITHOUT CONTRAST TECHNIQUE: Multidetector CT imaging of the abdomen and pelvis was performed following the standard protocol without IV contrast. COMPARISON:  05/11/2016 FINDINGS: Lower chest: Trace bilateral effusions with streaky atelectasis at the left lung base. Mild cardiomegaly. Moderate hiatal hernia Hepatobiliary: Stable 2 cm cyst in the dome of the right lobe. No biliary dilatation.  Gallbladder not well visualized and may be contracted. Pancreas: Atrophic.  No surrounding inflammation. Spleen: Normal in size without focal abnormality. Adrenals/Urinary Tract: Stable 15 mm low-attenuation mass in the right adrenal consistent with adenoma. Left adrenal gland within normal limits. Nonobstructing stone or intrarenal vascular calcification right kidney. Bladder normal Stomach/Bowel: The stomach is nonenlarged. No dilated small bowel. No colon wall thickening. Sigmoid colon diverticula without acute inflammation Vascular/Lymphatic: Aortic atherosclerosis. No enlarged abdominal or pelvic lymph nodes. Reproductive: Uterus and bilateral adnexa are unremarkable. Other: No free air or free fluid. Large amount of subcutaneous edema within the left lateral posterior subcutaneous fat that extends inferiorly along the left lateral lower chest wall and abdominal wall. This continues inferiorly over the left hip and pelvis. There is diffuse edema surrounding the left hip muscles with moderate diffuse edema in the proximal left thigh. Musculoskeletal: Degenerative changes of the  spine. No acute or suspicious bone lesion. IMPRESSION: 1. Large amount of subcutaneous and soft tissue edema extending from the left lateral chest wall, inferiorly over the left hip. There is diffuse soft tissue edema surrounding the left hip musculature and involving the proximal left thigh. 2. There is no CT evidence for acute intra-abdominal or pelvic pathology 3. Nonobstructing stone or intrarenal vascular calcification right kidney. 4. Sigmoid colon diverticular disease without acute inflammation 5. Moderate hiatal hernia 6. Trace bilateral effusions with linear atelectasis at the left base. 7. 1.5 cm right adrenal gland adenoma Electronically Signed   By: Jasmine Pang M.D.   On: 11/20/2016 02:16   Dg Tibia/fibula Left  Result Date: 11/19/2016 CLINICAL DATA:  Pain and swelling EXAM: LEFT TIBIA AND FIBULA - 2 VIEW COMPARISON:  None. FINDINGS: Diffuse subcutaneous edema is identified. No fracture or dislocation is seen. Mild osteopenia is seen. IMPRESSION: Soft tissue swelling without acute bony abnormality. Electronically Signed   By: Alcide Clever M.D.   On: 11/19/2016 19:09   Ct Head Wo Contrast  Result Date: 11/20/2016 CLINICAL DATA:  Altered level of consciousness following a fall 3 weeks ago EXAM: CT HEAD WITHOUT CONTRAST CT CERVICAL SPINE WITHOUT CONTRAST TECHNIQUE: Multidetector CT imaging of the head and cervical spine was performed following the standard protocol without intravenous contrast. Multiplanar CT image reconstructions of the cervical spine were also generated. COMPARISON:  10/27/2016 FINDINGS: CT HEAD FINDINGS Brain: No acute territorial infarction, hemorrhage or intracranial mass. Moderate atrophy. Old lacunar infarct left basal ganglia. Mild to moderate periventricular small vessel ischemic changes. Stable ventricle size Vascular: No hyperdense vessels.  Carotid artery calcification Skull: No fracture or suspicious bone lesion Sinuses/Orbits: No acute finding. Other: None CT CERVICAL  SPINE FINDINGS Alignment: Exaggerated lordosis. Minimal retrolisthesis of C3 on C4 and C4 on C5. Facets maintain alignment. Skull base and vertebrae: Craniovertebral junction is intact. No fracture. Soft tissues and spinal canal: No prevertebral fluid or swelling. No visible canal hematoma. Disc levels: Moderate degenerative disc changes at C4-C5. Multilevel facet arthropathy results in multilevel foraminal narrowing. Upper chest: No acute abnormality at the lung apices. Carotid artery calcification. Other: None IMPRESSION: 1. No CT evidence for acute intracranial abnormality. Atrophy and small vessel ischemic changes 2. Exaggerated cervical lordosis.  No fracture visualized. Electronically Signed   By: Jasmine Pang M.D.   On: 11/20/2016 02:00   Ct Cervical Spine Wo Contrast  Result Date: 11/20/2016 CLINICAL DATA:  Altered level of consciousness following a fall 3 weeks ago EXAM: CT HEAD WITHOUT CONTRAST CT CERVICAL SPINE WITHOUT CONTRAST TECHNIQUE: Multidetector CT imaging of the head and  cervical spine was performed following the standard protocol without intravenous contrast. Multiplanar CT image reconstructions of the cervical spine were also generated. COMPARISON:  10/27/2016 FINDINGS: CT HEAD FINDINGS Brain: No acute territorial infarction, hemorrhage or intracranial mass. Moderate atrophy. Old lacunar infarct left basal ganglia. Mild to moderate periventricular small vessel ischemic changes. Stable ventricle size Vascular: No hyperdense vessels.  Carotid artery calcification Skull: No fracture or suspicious bone lesion Sinuses/Orbits: No acute finding. Other: None CT CERVICAL SPINE FINDINGS Alignment: Exaggerated lordosis. Minimal retrolisthesis of C3 on C4 and C4 on C5. Facets maintain alignment. Skull base and vertebrae: Craniovertebral junction is intact. No fracture. Soft tissues and spinal canal: No prevertebral fluid or swelling. No visible canal hematoma. Disc levels: Moderate degenerative disc  changes at C4-C5. Multilevel facet arthropathy results in multilevel foraminal narrowing. Upper chest: No acute abnormality at the lung apices. Carotid artery calcification. Other: None IMPRESSION: 1. No CT evidence for acute intracranial abnormality. Atrophy and small vessel ischemic changes 2. Exaggerated cervical lordosis.  No fracture visualized. Electronically Signed   By: Jasmine Pang M.D.   On: 11/20/2016 02:00   Dg Chest Port 1 View  Result Date: 11/19/2016 CLINICAL DATA:  Dyspnea and dementia EXAM: PORTABLE CHEST 1 VIEW COMPARISON:  10/27/2016 FINDINGS: Cardiomegaly is stable. Tortuous atherosclerotic aorta is likewise unchanged. The patient's chin obscures the apices more so on the right. Subtle and airspace opacity is noted in the right mid lung since prior exam, some of which is due to overlapping ribs and scapula but findings are suspicious for an evolving pneumonia. Left basilar scarring is noted. Clinical correlation is suggested. Old left-sided lower rib fractures are stable. IMPRESSION: 1. Stable cardiomegaly with aortic atherosclerosis. 2. Subtle opacity in the right mid lung is suspicious for an evolving pneumonia although some of this appearance is due to overlapping ribs and scapula. Electronically Signed   By: Tollie Eth M.D.   On: 11/19/2016 20:45    Cardiac Studies   none  Patient Profile     83 y.o. female with a history of dementia, GI bleed, CKD stage III, permanent atrial fibrillation on no anticoagulation (had been taking Eliquis but this was stopped due to GI bleed) and depression who presented to ER with complaints of MS changes and a fall.  According to her niece she was found confused for the past 24 hours and was found to have tachycardia up to 160-200bpm. She apparently fell 3 weeks ago and sustained a left leg injury sustaining hematomas on her leg.  In ER, BNP was 154 and K 5.6 and cxray showed a RML infiltrate c/w PNA.  WBC elevated at 13.6.  Cardiology was asked  to see to help with treatment of afib.   Assessment & Plan    1.  Atrial fibrillation with RVR - she has permanent atrial fibrillation and now afib with RVR likely related to acute respiratory illness.  BP initially soft but now 139/49mmHg.  Agree with continuing Cardizem gtt for rate control. HR still in upper 100's at times so will increase Cardizem gtt to 10mg /hr.  She has a history of GIB and anticoagulation was stopped. Now found to have an extensive LLE DVT so placed on IV Heparin gtt.  2D echo pending to assess LVF.  TSH is normal.    2.  Chronic diastolic CHF - she appears euvolemic   3.  Possible HCAP with initial leukocytosis and RML infiltrate.  WBC normalized.  Started on IV antibiotics per IM.  4.  Acute encephalopathy ? Secondary to infection. Very combative this am and refused lab draw and refused to let me examine her.  She tried to hit me.   5.  Hyperkalemia - given Kayexalate per TRH.  BMET pending this am.  6.  LLE DVT extending from left common femoral vein throughout the extremity and left iliac appears thrombosed.  Vascular surgery following.  Started on IV Heparin gt.  Signed, Armanda Magicraci Judene Logue, MD  11/21/2016, 7:06 AM

## 2016-11-21 NOTE — Progress Notes (Signed)
ANTICOAGULATION CONSULT NOTE - Follow Up Consult  Pharmacy Consult for Heparin Indication: DVT  Allergies  Allergen Reactions  . Penicillins Swelling    Has patient had a PCN reaction causing immediate rash, facial/tongue/throat swelling, SOB or lightheadedness with hypotension: Yes Has patient had a PCN reaction causing severe rash involving mucus membranes or skin necrosis: No Has patient had a PCN reaction that required hospitalization unknown Has patient had a PCN reaction occurring within the last 10 years: No If all of the above answers are "NO", then may proceed with Cephalosporin use.  . Sulfa Antibiotics Hives and Swelling    Patient Measurements: Height: 5' (152.4 cm) (pt. unaware of height, height from previous admission) Weight: 116 lb 9.6 oz (52.9 kg) IBW/kg (Calculated) : 45.5   Vital Signs:    Labs:  Recent Labs  11/19/16 1940 11/20/16 0202 11/20/16 0707 11/20/16 1244 11/20/16 1604  HGB 8.6*  --   --  8.0*  --   HCT 29.2*  --   --  26.8*  --   PLT 324  --   --  260  --   APTT 24  --   --   --   --   LABPROT 14.4  --   --   --   --   INR 1.12  --   --   --   --   CREATININE 1.67*  --   --  1.27*  --   CKTOTAL  --   --   --   --  20*  TROPONINI  --  <0.03 <0.03 <0.03  --     Estimated Creatinine Clearance: 20.7 mL/min (A) (by C-G formula based on SCr of 1.27 mg/dL (H)).   Assessment: 83 year old female restarting heparin this AM for DVT, stopped last night for low HgB  Goal of Therapy:  Heparin level 0.3-0.7 units/ml Monitor platelets by anticoagulation protocol: Yes   Plan:  Resume heparin at 800 units / hr Heparin level 8 hours after heparin starts Daily heparin level, CBC  Thank you. Okey RegalLisa Moroni Nester, PharmD [phone]  11/21/2016,8:58 AM

## 2016-11-21 NOTE — Progress Notes (Signed)
  Progress Note    11/21/2016 8:40 AM * No surgery found *  Subjective:  Confused and agitated  Vitals:   11/20/16 1644 11/20/16 2054  BP: 140/69 139/79  Pulse: (!) 126 (!) 114  Resp: (!) 26 (!) 22  Temp:  98.6 F (37 C)    Physical Exam: Awake, agitated Left flank without overt hematoma lle with signficant edema, palpable dp pulse  CBC    Component Value Date/Time   WBC 8.2 11/20/2016 1244   RBC 3.33 (L) 11/20/2016 1244   HGB 8.0 (L) 11/20/2016 1244   HCT 26.8 (L) 11/20/2016 1244   PLT 260 11/20/2016 1244   MCV 80.5 11/20/2016 1244   MCH 24.0 (L) 11/20/2016 1244   MCHC 29.9 (L) 11/20/2016 1244   RDW 15.8 (H) 11/20/2016 1244   LYMPHSABS 0.8 11/20/2016 1244   MONOABS 0.7 11/20/2016 1244   EOSABS 0.0 11/20/2016 1244   BASOSABS 0.0 11/20/2016 1244    BMET    Component Value Date/Time   NA 135 11/20/2016 1244   K 4.5 11/20/2016 1244   CL 101 11/20/2016 1244   CO2 26 11/20/2016 1244   GLUCOSE 102 (H) 11/20/2016 1244   BUN 16 11/20/2016 1244   CREATININE 1.27 (H) 11/20/2016 1244   CREATININE 1.84 (H) 05/02/2016 1554   CALCIUM 8.5 (L) 11/20/2016 1244   GFRNONAA 36 (L) 11/20/2016 1244   GFRAA 41 (L) 11/20/2016 1244    INR    Component Value Date/Time   INR 1.12 11/19/2016 1940     Intake/Output Summary (Last 24 hours) at 11/21/16 0840 Last data filed at 11/21/16 0600  Gross per 24 hour  Intake          1373.75 ml  Output                0 ml  Net          1373.75 ml     Assessment/plan:  83 y.o. female is here with extensive lle dvt and flank hematoma with recent + fobt. On heparin ggt for now and trend h/h. If she cannot tolerate anticoagulation would need ivc filter. Palliative care discussion prudent.     Roma Bierlein C. Randie Heinzain, MD [facility] Office: [phone] Pager: [phone]  11/21/2016 8:40 AM

## 2016-11-21 NOTE — Progress Notes (Signed)
SLP Cancellation Note  Patient Details Name: Melanie Blake MRN: [id number] DOB: 11/23/1923   Cancelled treatment:       Reason Eval/Treat Not Completed: Fatigue/lethargy limiting ability to participate.  Will f/u next date if appropriate.    Blenda MountsCouture, [name] 11/21/2016, 9:57 AM

## 2016-11-21 NOTE — Consult Note (Signed)
Consultation Note Date: 11/21/2016   Patient Name: Melanie Blake  DOB: 03/21/1924  MRN: [id number]  Age / Sex: 83 y.o., female  PCP: [facility] Referring Physician: Rodolph Bong, MD  Reason for Consultation: Establishing goals of care, Non pain symptom management and Psychosocial/spiritual support  HPI/Patient Profile: 83 y.o. female  with past medical history of Atrial fib, chronic diastolic heart failure, chronic kidney disease stage III, dementia, GI bleed, hypothyroidism, depression admitted on 11/19/2016 with altered mental status, fall. Patient was found to be in A. fib with RVR, with a heart rate noted in the emergency room of 160-200. She was also found to have pneumonia. She was recently hospitalized 10/27/2016 through 10/28/2016 after a fall. Her anticoagulation, Lasix as well as Cardizem was stopped at that time. Additionally, patient has a very large DVT on left lower extremity. Her hospital course has been complicated by delirium, agitation..   Clinical Assessment and Goals of Care: Patient is acutely confused. Per chart review she has been agitated, frightened and refusing care. Her niece, Jennefer Bravo, is her healthcare power of attorney. I spoke to Mrs. Newman on the phone and we reviewed disease progression related to dementia, history of GI bleed, falls as well as anticoagulation and dysphagia. Ms. Ezzard Standing shares with me that the patient has been asking her to poison her or kill her as she has been so distressed over the past 30-45 days. Her niece describes a very sharp decline over this period of time where she has had to leave her condominium go to a skilled nursing facility and then back to the hospital.  Her niece is her health care proxy, Sandford Craze [phone]    SUMMARY OF RECOMMENDATIONS   DO NOT RESUSCITATE DO NOT INTUBATE Patient would not want a PEG tube. If  patient is alert please allow her to eat for comfort. Niece understands the risk of aspiration Palliative medicine to meet with Mrs. Newman on 11/23/2016 at 11 AM where we will review her ability to tolerate heparin, hemoglobin as well as her mental status. We will discuss as well risks and benefits related to IVC filter placement Code Status/Advance Care Planning:  DNR    Symptom Management:   Delirium: Recommend starting Haldol 2 mg IV every 6 hours as needed for acute agitation. We'll start Zyprexa melt-a-way 5 mg daily at bedtime. Discussed black box warning related to antipsychotics in the elderly in the context of acute delirium impacting her ability to care for patient. Healthcare POA was in agreement to start given patient's level of distress. Continue Ativan as needed  Palliative Prophylaxis:   Aspiration, Bowel Regimen, Delirium Protocol, Frequent Pain Assessment, Oral Care and Turn Reposition  Additional Recommendations (Limitations, Scope, Preferences):  Minimize Medications, Initiate Comfort Feeding, No Artificial Feeding, No Chemotherapy, No Hemodialysis and No Tracheostomy  Psycho-social/Spiritual:   Desire for further Chaplaincy support:no  Additional Recommendations: Grief/Bereavement Support  Prognosis:   Unable to determine  Discharge Planning: To Be Determined      Primary Diagnoses: Present  on Admission: . Atrial fibrillation with RVR (HCC) . Abdominal tenderness . Stage 3 chronic kidney disease . Chronic diastolic CHF (congestive heart failure) (HCC) . Hyperkalemia . Acute encephalopathy . Depression . HCAP (healthcare-associated pneumonia) . Dvt femoral (deep venous thrombosis) (HCC): Common femeral vein and throughout LLE   I have reviewed the medical record, interviewed the patient and family, and examined the patient. The following aspects are pertinent.  Past Medical History:  Diagnosis Date  . Atrial fibrillation (HCC)    dx 10/2012; s/p  DCCV => NSR 12/23/2012  . Cataract   . Chronic diastolic heart failure (HCC)    a. Echo 4/14: EF "grossly normal", mild AI, mild MR, PASP 40, mild reduced RVSF   Social History   Social History  . Marital status: Widowed    Spouse name: N/A  . Number of children: N/A  . Years of education: N/A   Social History Main Topics  . Smoking status: Never Smoker  . Smokeless tobacco: Never Used  . Alcohol use No  . Drug use: No  . Sexual activity: No   Other Topics Concern  . None   Social History Narrative  . None   Family History  Problem Relation Age of Onset  . Heart attack Neg Hx    Scheduled Meds: . escitalopram  10 mg Oral QHS  . levothyroxine  25 mcg Intravenous Daily  . mouth rinse  15 mL Mouth Rinse BID  . mirtazapine  15 mg Oral QHS   Continuous Infusions: . aztreonam    . diltiazem (CARDIZEM) infusion 5 mg/hr (11/20/16 0328)  . heparin 800 Units/hr (11/21/16 0900)   PRN Meds:.acetaminophen, albuterol, haloperidol lactate, LORazepam, ondansetron Medications Prior to Admission:  Prior to Admission medications   Medication Sig Start Date End Date Taking? Authorizing Provider  acetaminophen (TYLENOL) 500 MG tablet Take 2 tablets (1,000 mg total) by mouth every 8 (eight) hours as needed. Patient taking differently: Take 1,000 mg by mouth every 8 (eight) hours as needed for headache (pain).  05/23/16  Yes Shanker Levora DredgeM Ghimire, MD  escitalopram (LEXAPRO) 10 MG tablet Take 10 mg by mouth at bedtime.    Yes Historical Provider, MD  levothyroxine (SYNTHROID, LEVOTHROID) 50 MCG tablet Take 50 mcg by mouth daily before breakfast.   Yes Historical Provider, MD  mirtazapine (REMERON) 15 MG tablet Take 15 mg by mouth at bedtime.   Yes Historical Provider, MD  ondansetron (ZOFRAN) 4 MG tablet Take 4 mg by mouth every 8 (eight) hours as needed for nausea or vomiting.   Yes Historical Provider, MD  traMADol (ULTRAM) 50 MG tablet Take 50 mg by mouth every 6 (six) hours. Scheduled: 12a,  6a, 12p, 6p   Yes Historical Provider, MD   Allergies  Allergen Reactions  . Penicillins Swelling    Has patient had a PCN reaction causing immediate rash, facial/tongue/throat swelling, SOB or lightheadedness with hypotension: Yes Has patient had a PCN reaction causing severe rash involving mucus membranes or skin necrosis: No Has patient had a PCN reaction that required hospitalization unknown Has patient had a PCN reaction occurring within the last 10 years: No If all of the above answers are "NO", then may proceed with Cephalosporin use.  . Sulfa Antibiotics Hives and Swelling   Review of Systems  Unable to perform ROS: Mental status change    Physical Exam  Constitutional:  Frail, elderly female who is acutely confused; appears ill  Cardiovascular:  Tachycardic with PVCs  Pulmonary/Chest: Effort normal.  Musculoskeletal: She exhibits edema.  Left lower extremity edema and tenderness  Neurological:  Patient has been delirious, agitated particularly with any sort of bedside care. Refusing medications, blood pressure  Skin: Skin is warm and dry.  Psychiatric:  Agitated, confused  Nursing note and vitals reviewed.   Vital Signs: BP 139/79 (BP Location: Left Arm)   Pulse (!) 105   Temp 97.8 F (36.6 C) (Oral)   Resp (!) 22   Ht 5' (1.524 m) Comment: pt. unaware of height, height from previous admission  Wt 52.9 kg (116 lb 9.6 oz)   SpO2 95%   BMI 22.77 kg/m  Pain Assessment: No/denies pain       SpO2: SpO2: 95 % O2 Device:SpO2: 95 % O2 Flow Rate: .O2 Flow Rate (L/min): 2 L/min  IO: Intake/output summary:  Intake/Output Summary (Last 24 hours) at 11/21/16 1338 Last data filed at 11/21/16 1200  Gross per 24 hour  Intake          1457.75 ml  Output                0 ml  Net          1457.75 ml    LBM: Last BM Date: 11/20/16 Baseline Weight: Weight: 52.9 kg (116 lb 9.6 oz) Most recent weight: Weight: 52.9 kg (116 lb 9.6 oz)     Palliative  Assessment/Data:   Flowsheet Rows     Most Recent Value  Intake Tab  Referral Department  Hospitalist  Unit at Time of Referral  Cardiac/Telemetry Unit  Palliative Care Primary Diagnosis  Cardiac  Date Notified  11/20/16  Palliative Care Type  New Palliative care  Reason for referral  Clarify Goals of Care, Non-pain Symptom, Psychosocial or Spiritual support, Counsel Regarding Hospice  Date of Admission  11/19/16  Date first seen by Palliative Care  11/21/16  # of days Palliative referral response time  1 Day(s)  # of days IP prior to Palliative referral  1  Clinical Assessment  Palliative Performance Scale Score  30%  Pain Max last 24 hours  Not able to report  Pain Min Last 24 hours  Not able to report  Dyspnea Max Last 24 Hours  Not able to report  Dyspnea Min Last 24 hours  Not able to report  Nausea Max Last 24 Hours  Not able to report  Nausea Min Last 24 Hours  Not able to report  Anxiety Max Last 24 Hours  Not able to report  Anxiety Min Last 24 Hours  Not able to report  Other Max Last 24 Hours  Not able to report  Psychosocial & Spiritual Assessment  Palliative Care Outcomes  Patient/Family meeting held?  Yes  Who was at the meeting?  daughter via phone  Palliative Care Outcomes  Improved non-pain symptom therapy, Counseled regarding hospice, Provided psychosocial or spiritual support, Clarified goals of care  Patient/Family wishes: Interventions discontinued/not started   Mechanical Ventilation, Hemodialysis, Vasopressors, Trach, Tube feedings/TPN, PEG  Palliative Care follow-up planned  Yes, Facility      Time In: 0930 Time Out: 1045 Time Total: 75 min Greater than 50%  of this time was spent counseling and coordinating care related to the above assessment and plan. Staffed with Dr. Janee Morn  Signed by: Irean Hong, NP   Please contact Palliative Medicine Team phone at [phone] for questions and concerns.  For individual provider: See  Loretha Stapler

## 2016-11-21 NOTE — Clinical Social Work Note (Signed)
Clinical Social Work Assessment  Patient Details  Name: Melanie Blake MRN: [id number] Date of Birth: 03/28/1924  Date of referral:  11/21/16               Reason for consult:  Discharge Planning                Permission sought to share information with:  Family Supports Permission granted to share information::  Yes, Verbal Permission Granted  Name::     Melanie Blake  Agency::     Relationship::  niece  Contact Information:  [phone]  Housing/Transportation Living arrangements for the past 2 months:  Assisted Living Facility Source of Information:  Other (Comment Required) (niece is the poa) Patient Interpreter Needed:  None Criminal Activity/Legal Involvement Pertinent to Current Situation/Hospitalization:  No - Comment as needed Significant Relationships:  Other(Comment) Lives with:  Facility Resident Do you feel safe going back to the place where you live?    Need for family participation in patient care:  Yes (Comment)  Care giving concerns:  [facility] a [facility] and once patient was released she was then re-hospitalized. Patient niece is very supportive and would like the best for the patient.   Social Worker assessment / plan:  Clinical Social Worker was unable to assess patient. CSW spoke to patients HPOA Mindi Junker(Marsha). Mindi JunkerMarsha stated that when patient was discharge from SNF she was only at her ALF for a couple of hours before being readmitted back into the hospital. CSW and Mindi JunkerMarsha spoke about discharge options for patient once she become medically ready. Mindi JunkerMarsha stated that she would like to place patient in a facility that patient would be comfortable in. Mindi JunkerMarsha stated that because [facility] and patient is aware of her surroundings their she would like patient to go back. Mindi JunkerMarsha stated she would be interested in attaining private duty for  patient to watch over her during the night. CSW gave Mindi JunkerMarsha a list of private duty in the area and encouraged her to follow up. CSW remains available for support and discharge needs.   Employment status:  Retired Health and safety inspectornsurance information:  Medicare PT Recommendations:  Not assessed at this time Information / Referral to community resources:  Other (Comment Required)  Patient/Family's Response to care:  Family verbalized appreciation and understanding for CSW role and involvement in care.  Patient/Family's Understanding of and Emotional Response to Diagnosis, Current Treatment, and Prognosis:  Family with good understanding of current medical state and limitations around most recent hospitalization.   Emotional Assessment Appearance:  Appears stated age Attitude/Demeanor/Rapport:  Other, Unable to Assess Affect (typically observed):  Unable to Assess Orientation:  Oriented to Self Alcohol / Substance use:    Psych involvement (Current and /or in the community):  No (Comment)  Discharge Needs  Concerns to be addressed:  No discharge needs identified Readmission within the last 30 days:  Yes Current discharge risk:  None Barriers to Discharge:  No Barriers Identified   Althea Charonshley C Teresia Myint, LCSW 11/21/2016, 10:55 AM

## 2016-11-22 DIAGNOSIS — I82422 Acute embolism and thrombosis of left iliac vein: Secondary | ICD-10-CM

## 2016-11-22 LAB — CBC
HCT: 28.1 % — ABNORMAL LOW (ref 36.0–46.0)
Hemoglobin: 8.3 g/dL — ABNORMAL LOW (ref 12.0–15.0)
MCH: 23.8 pg — ABNORMAL LOW (ref 26.0–34.0)
MCHC: 29.5 g/dL — AB (ref 30.0–36.0)
MCV: 80.5 fL (ref 78.0–100.0)
PLATELETS: 319 10*3/uL (ref 150–400)
RBC: 3.49 MIL/uL — ABNORMAL LOW (ref 3.87–5.11)
RDW: 16.2 % — AB (ref 11.5–15.5)
WBC: 10.2 10*3/uL (ref 4.0–10.5)

## 2016-11-22 LAB — BASIC METABOLIC PANEL
ANION GAP: 17 — AB (ref 5–15)
BUN: 12 mg/dL (ref 6–20)
CALCIUM: 8.5 mg/dL — AB (ref 8.9–10.3)
CO2: 17 mmol/L — AB (ref 22–32)
CREATININE: 0.96 mg/dL (ref 0.44–1.00)
Chloride: 103 mmol/L (ref 101–111)
GFR, EST AFRICAN AMERICAN: 58 mL/min — AB (ref >60)
GFR, EST NON AFRICAN AMERICAN: 50 mL/min — AB (ref >60)
Glucose, Bld: 80 mg/dL (ref 65–99)
Potassium: 4 mmol/L (ref 3.5–5.1)
SODIUM: 137 mmol/L (ref 135–145)

## 2016-11-22 LAB — CULTURE, BLOOD (ROUTINE X 2): SPECIAL REQUESTS: ADEQUATE

## 2016-11-22 LAB — LEGIONELLA PNEUMOPHILA SEROGP 1 UR AG: L. PNEUMOPHILA SEROGP 1 UR AG: NEGATIVE

## 2016-11-22 LAB — HEPARIN LEVEL (UNFRACTIONATED)
HEPARIN UNFRACTIONATED: 0.77 [IU]/mL — AB (ref 0.30–0.70)
Heparin Unfractionated: 0.52 IU/mL (ref 0.30–0.70)

## 2016-11-22 MED ORDER — ACETAMINOPHEN 500 MG PO TABS
500.0000 mg | ORAL_TABLET | Freq: Four times a day (QID) | ORAL | Status: DC | PRN
  Administered 2016-11-22: 500 mg via ORAL
  Filled 2016-11-22: qty 1

## 2016-11-22 MED ORDER — OLANZAPINE 5 MG PO TBDP
5.0000 mg | ORAL_TABLET | Freq: Every day | ORAL | Status: DC
  Administered 2016-11-22 – 2016-11-23 (×2): 5 mg via ORAL
  Filled 2016-11-22 (×3): qty 1

## 2016-11-22 MED ORDER — TRAMADOL HCL 50 MG PO TABS
25.0000 mg | ORAL_TABLET | Freq: Four times a day (QID) | ORAL | Status: DC | PRN
  Administered 2016-11-22 – 2016-11-23 (×3): 50 mg via ORAL
  Filled 2016-11-22 (×3): qty 1

## 2016-11-22 MED ORDER — SODIUM BICARBONATE 650 MG PO TABS
650.0000 mg | ORAL_TABLET | Freq: Two times a day (BID) | ORAL | Status: DC
  Administered 2016-11-22 – 2016-11-24 (×4): 650 mg via ORAL
  Filled 2016-11-22 (×5): qty 1

## 2016-11-22 MED ORDER — PANTOPRAZOLE SODIUM 40 MG PO PACK
40.0000 mg | PACK | Freq: Every day | ORAL | Status: DC
  Administered 2016-11-22 – 2016-11-24 (×2): 40 mg via ORAL
  Filled 2016-11-22 (×2): qty 20

## 2016-11-22 MED ORDER — LEVOTHYROXINE SODIUM 50 MCG PO TABS
50.0000 ug | ORAL_TABLET | Freq: Every day | ORAL | Status: DC
  Administered 2016-11-23 – 2016-11-24 (×2): 50 ug via ORAL
  Filled 2016-11-22 (×3): qty 1

## 2016-11-22 MED ORDER — HALOPERIDOL LACTATE 5 MG/ML IJ SOLN: 1.0000 mg | Freq: Four times a day (QID) | INTRAMUSCULAR | Status: DC | PRN

## 2016-11-22 MED ORDER — TRAMADOL 5 MG/ML ORAL SUSPENSION: 25.0000 mg | Freq: Four times a day (QID) | ORAL | Status: DC | PRN

## 2016-11-22 NOTE — Progress Notes (Signed)
PROGRESS NOTE    Weyman PedroBettie M Kneeland  ZOX:[id number] DOB: 11/30/1923 DOA: 11/19/2016 PCP: [facility]    Brief Narrative:  Patient is a 83 year old female history significant for dementia, GI bleed, chronic kidney disease stage III, atrial fibrillation not on anticoagulation, hypothyroidism, depression who was recently hospitalized from 10/27/2016-10/28/2016 secondary to a fall with generalized weakness and dizziness. Patient noted during the hospitalization to have low normal blood pressure and a such patient's Cardizem Lasix and metoprolol were discontinued on discharge. Patient also noted to have swelling in the lower extremity as well as soft tissue swelling with a recent drop in hemoglobin and a such patient's anticoagulation was discontinued on discharge. Patient presented back to the ED with altered mental status and fall and noted to be in A. fib with RVR with heart rates in the 160s to the 200s. Patient placed on a Cardizem drip. Patient also noted to have a leukocytosis and chest x-ray with subtle opacity in the right middle lung lobe and a such patient placed on antibiotics due to hypotension for probable healthcare associated pneumonia.   Assessment & Plan:   Principal Problem:   Atrial fibrillation with RVR (HCC) Active Problems:   Dvt femoral (deep venous thrombosis) (HCC): Common femeral vein and throughout LLE   Chronic diastolic CHF (congestive heart failure) (HCC)   Stage 3 chronic kidney disease   Abdominal tenderness   Hyperkalemia   Acute encephalopathy   Depression   HCAP (healthcare-associated pneumonia)   Abnormal CT of the abdomen   Palliative care by specialist   #1 A. fib with RVR CHA2DS2VASC 4 Patient noted on presentation to be in A. fib with RVR. Patient on Cardizem drip with heart rates noted this morning to be in the 90-110s however improving. Patient's A. fib with RVR likely secondary to discontinuation of her rate limiting medications during  last hospitalization. Cardiac enzymes have been negative 2. 2-D echo pending. TSH at 1.907. Continue Cardizem drip. Follow. Cardiology has been consulted as patient was noted to have soft blood pressures. Appreciate input and recommendation.  #2 chronic diastolic heart failure Stable. Patient seems compensated. Cardiology following.  #3 probable HCAP On admission patient noted to have a leukocytosis was tachycardic and chest x-ray with subtle opacity in the right middle lung lobe. Urine Legionella antigen pending urine pneumococcus antigen pending. Blood cultures pending. Sputum Gram stain and culture pending. Patient placed empirically on IV antibiotics for probable healthcare associated pneumonia. Discontinued IV vancomycin and IV Levaquin. Continue IV Azactam.  #4 acute encephalopathy ?? Etiology. Patient with a baseline dementia. Will be multifactorial secondary to A. fib with RVR, possible pneumonia and electrolyte disturbance. CT head negative for any acute abnormalities. Continue empiric IV antibiotics. Continue to manage atrial fibrillation.  #5 hyperkalemia On admission potassium was 5.6. EKG with no peak T waves.  #6 depression Stable. Resumed Lexapro and Remeron.   #7 hypothyroidism Continue IV Synthroid.  #8 dysphagia Per nursing patient choked while trying to drink tea this morning. Continue current dysphagia diet. Speech therapy evaluation.  #9 chronic kidney disease stage III Baseline creatinine 1.4-1.8. Renal function improved and better than baseline.  #10 abdominal tenderness Patient with no further abdominal tenderness this morning. Lipase levels within normal limits. CT abdomen and pelvis with a large amount of subcutaneous and soft tissue edema extending from the left lateral chest wall, inferiorly over the left hip. Diffuse soft tissue edema surrounding the left hip musculature and involving the left proximal thigh. No CT evidence of acute  intra-abdominal or pelvic  pathology.  #11 abnormal CT findings Secondary to fall. CT abdomen and pelvis with a large amount of subcutaneous and soft tissue edema extending from the left lateral chest wall, inferiorly over the left hip. Diffuse soft tissue edema surrounding the left hip musculature and involving the left proximal thigh. No CT evidence of acute intra-abdominal or pelvic pathology. Patient was seen in consultation by trauma surgery who recommended monitoring for now.  #12 Extensive left lower extremity DVT Patient underwent lower extremity Dopplers which showed an extensive left lower extremity DVT from the left, femoral vein throughout the extremity. Left iliac appear thrombosed. Consulted vascular surgery, Dr. Randie Heinz who came and assessed the patient and in discussion with Dr. Violeta Gelinas of trauma surgery recommended patient be placed on anticoagulation with IV heparin with close monitoring of hematoma and following patient's H&H. If patient's hemoglobin drops then will need to discontinue IV heparin and place IVC filter. Patient was started on heparin drip however this was discontinued by cardiology on 11/20/2016, however has been subsequently resumed. Monitor closely for worsening hematoma or bleeding. Appreciate vascular surgery input and, surgery input. Updated and spoke with patient's niece who is the healthcare power of attorney on 11/20/2016.   #12 dehydration IV fluids.  #13 fall Patient with fall 3 weeks ago with left leg injury with no bony fracture on the leg. Patient with no head injury. CT head and neck negative for any acute abnormalities. Lower extremity Dopplers consistent with extensive large left lower extremity DVT.  #13 dementia Patient noted to have a baseline dementia however patient noted to be very combative and agitated yesterday morning (11/21/2016). Patient less combative and less agitated this morning. IV Levaquin was discontinued. Continue Haldol as needed.      DVT  prophylaxis: SCDs Code Status: DO NOT RESUSCITATE Family Communication: Updated patient. No family at bedside.  Disposition Plan: Likely back to assisted living facility versus skilled nursing facility once medically stable, A. fib with RVR resolved and blood pressure stable.   Consultants:   Vascular surgery: Dr. Randie Heinz 11/20/2016  Cardiology: Dr. Mayford Knife 11/20/2016  Trauma: Dr. Violeta Gelinas 11/20/2016  Palliative care: Dr. Linna Darner 11/21/2016  Procedures:   CT abdomen and pelvis 11/20/2016  CT head CT C-spine 11/20/2016  Chest x-ray 11/19/2016  Plain films of Left tib-fib 11/19/2016  Lower extremity Dopplers 11/20/2016  2-D echo 11/21/2016--pending  Antimicrobials:   IV vancomycin 11/19/2016>>>> 11/21/2016  IV Levaquin 11/19/2016>>>> 11/21/2016  IV Azactam 11/19/2016>>> 11/20/2016  IV Azactam 11/21/2016   Subjective: Patient laying in bed. Patient denies any chest pain. No shortness of breath. Patient oriented to self only. Patient asking to get out of the bed and sit in the chair. Patient less combative and less agitated today. Patient denies any overt GI bleeding. Patient states she ate an egg and a doughnut that has son brought her for breakfast.  Objective: Vitals:   11/21/16 2111 11/22/16 0042 11/22/16 0329 11/22/16 0823  BP: 140/82 (!) 145/74 135/72   Pulse: (!) 113 (!) 103 (!) 117 (!) 112  Resp: (!) 32 (!) 24 (!) 28 (!) 23  Temp: 98.2 F (36.8 C) 98.3 F (36.8 C) 97.8 F (36.6 C) 98.8 F (37.1 C)  TempSrc: Oral Axillary Axillary Oral  SpO2: 95% 95% 98% 90%  Weight:   54.1 kg (119 lb 3.2 oz)   Height:        Intake/Output Summary (Last 24 hours) at 11/22/16 1029 Last data filed at 11/22/16 0400  Gross per  24 hour  Intake            350.1 ml  Output                0 ml  Net            350.1 ml   Filed Weights   11/20/16 0159 11/22/16 0329  Weight: 52.9 kg (116 lb 9.6 oz) 54.1 kg (119 lb 3.2 oz)    Examination:  General exam: Appears calm  and comfortable.Severe scoliotic kyphosis. Extremely dry mucous membranes. Respiratory system: Clear to auscultation anterior lung fields. Respiratory effort normal. Cardiovascular system: Irregularly irregular. Grade 3/6 systolic ejection murmur. No JVD, murmurs, rubs, gallops or clicks. Left lower extremity less tight and less swollen with ecchymosis and hematoma.Left lower extremity less erythematous. Gastrointestinal system: Abdomen is nondistended, soft and nontender. No organomegaly or masses felt. Normal bowel sounds heard. Central nervous system: Alert and oriented to self only. No focal neurological deficits. Extremities:" Left lower extremity less tight, less swollen, ecchymosis and hematoma. Skin: Patient with diffuse ecchymosis on extremities. Psychiatry: Judgement and insight appear poor. Mood & affect appropriate.     Data Reviewed: I have personally reviewed following labs and imaging studies  CBC:  Recent Labs Lab 11/19/16 1940 11/20/16 1244 11/22/16 0435  WBC 13.6* 8.2 10.2  NEUTROABS 12.0* 6.7  --   HGB 8.6* 8.0* 8.3*  HCT 29.2* 26.8*  26.3* 28.1*  MCV 81.8 80.5 80.5  PLT 324 260 319   Basic Metabolic Panel:  Recent Labs Lab 11/19/16 1940 11/20/16 1244 11/22/16 0435  NA 135 135 137  K 5.6* 4.5 4.0  CL 99* 101 103  CO2 23 26 17*  GLUCOSE 102* 102* 80  BUN 19 16 12   CREATININE 1.67* 1.27* 0.96  CALCIUM 9.2 8.5* 8.5*  MG  --  2.2  --    GFR: Estimated Creatinine Clearance: 27.4 mL/min (by C-G formula based on SCr of 0.96 mg/dL). Liver Function Tests:  Recent Labs Lab 11/19/16 1940  AST 27  ALT 12*  ALKPHOS 109  BILITOT 1.0  PROT 6.9  ALBUMIN 3.0*    Recent Labs Lab 11/20/16 0011  LIPASE 23    Recent Labs Lab 11/20/16 1244  AMMONIA 17   Coagulation Profile:  Recent Labs Lab 11/19/16 1940  INR 1.12   Cardiac Enzymes:  Recent Labs Lab 11/20/16 0202 11/20/16 0707 11/20/16 1244 11/20/16 1604  CKTOTAL  --   --   --  20*    TROPONINI <0.03 <0.03 <0.03  --    BNP (last 3 results) No results for input(s): PROBNP in the last 8760 hours. HbA1C: No results for input(s): HGBA1C in the last 72 hours. CBG: No results for input(s): GLUCAP in the last 168 hours. Lipid Profile: No results for input(s): CHOL, HDL, LDLCALC, TRIG, CHOLHDL, LDLDIRECT in the last 72 hours. Thyroid Function Tests:  Recent Labs  11/19/16 2100  TSH 1.907   Anemia Panel:  Recent Labs  11/20/16 1244  VITAMINB12 673   Sepsis Labs:  Recent Labs Lab 11/19/16 2210  LATICACIDVEN 1.18    Recent Results (from the past 240 hour(s))  Culture, blood (Routine X 2) w Reflex to ID Panel     Status: None (Preliminary result)   Collection Time: 11/19/16  9:50 PM  Result Value Ref Range Status   Specimen Description BLOOD LEFT ANTECUBITAL  Final   Special Requests   Final    BOTTLES DRAWN AEROBIC AND ANAEROBIC Blood Culture adequate volume  Culture  Setup Time   Final    GRAM POSITIVE COCCI IN CLUSTERS AEROBIC BOTTLE ONLY CRITICAL RESULT CALLED TO, READ BACK BY AND VERIFIED WITH: T. DANG, RPHARMD AT 1550 ON 11/21/16 BY C. JESSUP, MLT.    Culture GRAM POSITIVE COCCI  Final   Report Status PENDING  Incomplete  Blood Culture ID Panel (Reflexed)     Status: None   Collection Time: 11/19/16  9:50 PM  Result Value Ref Range Status   Enterococcus species NOT DETECTED NOT DETECTED Final   Listeria monocytogenes NOT DETECTED NOT DETECTED Final   Staphylococcus species NOT DETECTED NOT DETECTED Final   Staphylococcus aureus NOT DETECTED NOT DETECTED Final   Streptococcus species NOT DETECTED NOT DETECTED Final   Streptococcus agalactiae NOT DETECTED NOT DETECTED Final   Streptococcus pneumoniae NOT DETECTED NOT DETECTED Final   Streptococcus pyogenes NOT DETECTED NOT DETECTED Final   Acinetobacter baumannii NOT DETECTED NOT DETECTED Final   Enterobacteriaceae species NOT DETECTED NOT DETECTED Final   Enterobacter cloacae complex NOT  DETECTED NOT DETECTED Final   Escherichia coli NOT DETECTED NOT DETECTED Final   Klebsiella oxytoca NOT DETECTED NOT DETECTED Final   Klebsiella pneumoniae NOT DETECTED NOT DETECTED Final   Proteus species NOT DETECTED NOT DETECTED Final   Serratia marcescens NOT DETECTED NOT DETECTED Final   Haemophilus influenzae NOT DETECTED NOT DETECTED Final   Neisseria meningitidis NOT DETECTED NOT DETECTED Final   Pseudomonas aeruginosa NOT DETECTED NOT DETECTED Final   Candida albicans NOT DETECTED NOT DETECTED Final   Candida glabrata NOT DETECTED NOT DETECTED Final   Candida krusei NOT DETECTED NOT DETECTED Final   Candida parapsilosis NOT DETECTED NOT DETECTED Final   Candida tropicalis NOT DETECTED NOT DETECTED Final  Culture, blood (Routine X 2) w Reflex to ID Panel     Status: None (Preliminary result)   Collection Time: 11/19/16  9:55 PM  Result Value Ref Range Status   Specimen Description BLOOD  Final   Special Requests   Final    BOTTLES DRAWN AEROBIC ONLY Blood Culture adequate volume   Culture NO GROWTH 2 DAYS  Final   Report Status PENDING  Incomplete         Radiology Studies: No results found.      Scheduled Meds: . escitalopram  10 mg Oral QHS  . levothyroxine  25 mcg Intravenous Daily  . mouth rinse  15 mL Mouth Rinse BID  . mirtazapine  15 mg Oral QHS  . OLANZapine zydis  5 mg Oral QHS  . sodium bicarbonate  650 mg Oral BID   Continuous Infusions: . aztreonam Stopped (11/21/16 2148)  . diltiazem (CARDIZEM) infusion 5 mg/hr (11/21/16 1431)  . heparin 900 Units/hr (11/21/16 2106)     LOS: 3 days    Time spent: 35 minutes    Shaheer Bonfield, MD [facility] Pager [phone]  If 7PM-7AM, please contact night-coverage [email or web address] Password [email or web address] 11/22/2016, 10:29 AM

## 2016-11-22 NOTE — Progress Notes (Signed)
ANTICOAGULATION CONSULT NOTE - Follow Up Consult  Pharmacy Consult for Heparin Indication: DVT (extensive LLE)  Allergies  Allergen Reactions  . Penicillins Swelling    Has patient had a PCN reaction causing immediate rash, facial/tongue/throat swelling, SOB or lightheadedness with hypotension: Yes Has patient had a PCN reaction causing severe rash involving mucus membranes or skin necrosis: No Has patient had a PCN reaction that required hospitalization unknown Has patient had a PCN reaction occurring within the last 10 years: No If all of the above answers are "NO", then may proceed with Cephalosporin use.  . Sulfa Antibiotics Hives and Swelling    Patient Measurements: Height: 5' (152.4 cm) (pt. unaware of height, height from previous admission) Weight: 119 lb 3.2 oz (54.1 kg) IBW/kg (Calculated) : 45.5 kg   Vital Signs: Temp: 97.8 F (36.6 C) (05/05 0329) Temp Source: Axillary (05/05 0329) BP: 135/72 (05/05 0329) Pulse Rate: 117 (05/05 0329)  Labs:  Recent Labs  11/19/16 1940 11/20/16 0202 11/20/16 0707 11/20/16 1244 11/20/16 1604 11/21/16 1807 11/22/16 0435  HGB 8.6*  --   --  8.0*  --   --  8.3*  HCT 29.2*  --   --  26.8*  26.3*  --   --  28.1*  PLT 324  --   --  260  --   --  319  APTT 24  --   --   --   --   --   --   LABPROT 14.4  --   --   --   --   --   --   INR 1.12  --   --   --   --   --   --   HEPARINUNFRC  --   --   --   --   --  <0.10* 0.52  CREATININE 1.67*  --   --  1.27*  --   --   --   CKTOTAL  --   --   --   --  20*  --   --   TROPONINI  --  <0.03 <0.03 <0.03  --   --   --     Estimated Creatinine Clearance: 20.7 mL/min (A) (by C-G formula based on SCr of 1.27 mg/dL (H)).   Assessment: [age] year old female restarting heparin this AM for extensive LLE DVT. Heparin level therapeutic (0.52) on gtt at 900 units/hr. Hgb low but stable, no bleeding noted.  Goal of Therapy:  Heparin level 0.3-0.7 units/ml Monitor platelets by anticoagulation  protocol: Yes   Plan:  Continue heparin at 900 units / hr Will f/u 6 hr confirmatory heparin level  Christoper Fabianaron Ramyah Pankowski, PharmD, BCPS Clinical pharmacist, pager [phone] 11/22/2016,6:00 AM

## 2016-11-22 NOTE — Progress Notes (Signed)
Dr Norris Crossurner's rounding note reviewed, we have been managing the patient's afib in setting of HCAP. She has been on dilt gtt. Has not been on longterm anticoag due to history of GI bleed, in hospital started on hep gtt due to DVT. Palliative care consulted yesterday, she has been made DNR. Has had issues with delerium, unsteady oral intake. At this time continue dilt gtt, rates 90s-110s. May transition to oral when taking po more steady, hopefully as pneumonia improves less tachcyardia drive.    Dominga FerryJ Branch MD

## 2016-11-22 NOTE — Progress Notes (Signed)
Daily Progress Note   Patient Name: Melanie Blake       Date: 11/22/2016 DOB: [birth year]  Age: 83 y.o. MRN#: [id number] Attending Physician: Rodolph Bong, MD Primary Care Physician: [facility] Admit Date: 11/19/2016  Reason for Consultation/Follow-up: Establishing goals of care, Non pain symptom management and Psychosocial/spiritual support  Subjective: Patient more alert this morning, speech clearer at times. Observed her with staff getting a wash up and was not assaultive. She denies pain or shortness of breath however she exhibits increased work of breathing  Length of Stay: 3  Current Medications: Scheduled Meds:  . escitalopram  10 mg Oral QHS  . levothyroxine  25 mcg Intravenous Daily  . mouth rinse  15 mL Mouth Rinse BID  . mirtazapine  15 mg Oral QHS  . OLANZapine zydis  5 mg Oral QHS  . pantoprazole sodium  40 mg Oral Daily  . sodium bicarbonate  650 mg Oral BID    Continuous Infusions: . aztreonam Stopped (11/21/16 2148)  . diltiazem (CARDIZEM) infusion 5 mg/hr (11/21/16 1431)  . heparin 900 Units/hr (11/21/16 2106)    PRN Meds: acetaminophen, acetaminophen, albuterol, haloperidol lactate, LORazepam, ondansetron, traMADol  Physical Exam  Constitutional:  Frail cachectic elderly female  Cardiovascular:  Irregular  Pulmonary/Chest:  Increased work of breathing noted arrest however her rate is 22-24.  Musculoskeletal: Normal range of motion.  Very weak; two-person assist to stand Patient has significant kyphosis  Neurological: She is alert.  Patient more alert today. Speech fluctuating between being more understandable, able to communicate, and garbled  Skin: Skin is warm and dry. There is pallor.  Psychiatric:  Patient is still very  confused but was able to tolerate personal care this morning. She keeps asking to get out of the bed  Nursing note and vitals reviewed.           Vital Signs: BP 135/72 (BP Location: Left Arm)   Pulse (!) 112   Temp 98.8 F (37.1 C) (Oral)   Resp (!) 23   Ht 5' (1.524 m) Comment: pt. unaware of height, height from previous admission  Wt 54.1 kg (119 lb 3.2 oz)   SpO2 90%   BMI 23.28 kg/m  SpO2: SpO2: 90 % O2 Device: O2 Device: Not Delivered O2 Flow Rate: O2 Flow Rate (L/min): 2  L/min  Intake/output summary:  Intake/Output Summary (Last 24 hours) at 11/22/16 1046 Last data filed at 11/22/16 0900  Gross per 24 hour  Intake            470.1 ml  Output                0 ml  Net            470.1 ml   LBM: Last BM Date: 11/20/16 Baseline Weight: Weight: 52.9 kg (116 lb 9.6 oz) Most recent weight: Weight: 54.1 kg (119 lb 3.2 oz)       Palliative Assessment/Data:    Flowsheet Rows     Most Recent Value  Intake Tab  Referral Department  Hospitalist  Unit at Time of Referral  Cardiac/Telemetry Unit  Palliative Care Primary Diagnosis  Cardiac  Date Notified  11/20/16  Palliative Care Type  New Palliative care  Reason for referral  Clarify Goals of Care, Non-pain Symptom, Psychosocial or Spiritual support, Counsel Regarding Hospice  Date of Admission  11/19/16  Date first seen by Palliative Care  11/21/16  # of days Palliative referral response time  1 Day(s)  # of days IP prior to Palliative referral  1  Clinical Assessment  Palliative Performance Scale Score  30%  Pain Max last 24 hours  Not able to report  Pain Min Last 24 hours  Not able to report  Dyspnea Max Last 24 Hours  Not able to report  Dyspnea Min Last 24 hours  Not able to report  Nausea Max Last 24 Hours  Not able to report  Nausea Min Last 24 Hours  Not able to report  Anxiety Max Last 24 Hours  Not able to report  Anxiety Min Last 24 Hours  Not able to report  Other Max Last 24 Hours  Not able to report    Psychosocial & Spiritual Assessment  Palliative Care Outcomes  Patient/Family meeting held?  Yes  Who was at the meeting?  daughter via phone  Palliative Care Outcomes  Improved non-pain symptom therapy, Counseled regarding hospice, Provided psychosocial or spiritual support, Clarified goals of care  Patient/Family wishes: Interventions discontinued/not started   Mechanical Ventilation, Hemodialysis, Vasopressors, Trach, Tube feedings/TPN, PEG  Palliative Care follow-up planned  Yes, Facility      Patient Active Problem List   Diagnosis Date Noted  . Dvt femoral (deep venous thrombosis) (HCC): Common femeral vein and throughout LLE 11/21/2016  . Palliative care by specialist   . Abnormal CT of the abdomen   . Abdominal tenderness 11/19/2016  . Hyperkalemia 11/19/2016  . Acute encephalopathy 11/19/2016  . Depression 11/19/2016  . HCAP (healthcare-associated pneumonia) 11/19/2016  . Abdominal pain   . Left leg swelling   . Gastrointestinal hemorrhage 10/28/2016  . Acute GI bleeding 10/28/2016  . Acute blood loss anemia 10/27/2016  . UTI (urinary tract infection) 05/21/2016  . Generalized weakness 05/21/2016  . Altered mental status 05/21/2016  . Stage 3 chronic kidney disease 11/13/2013  . Atrial fibrillation (HCC) 11/11/2012  . Chronic diastolic heart failure (HCC) 11/11/2012  . Atrial fibrillation with RVR (HCC) 10/28/2012  . Chronic diastolic CHF (congestive heart failure) (HCC) 10/28/2012  . Elevated TSH 10/28/2012    Palliative Care Assessment & Plan   Patient Profile: Patient is a 83 year old female history significant for dementia, GI bleed, chronic kidney disease stage III, atrial fibrillation not on anticoagulation, hypothyroidism, depression who was recently hospitalized from 10/27/2016-10/28/2016 secondary to a fall with generalized weakness  and dizziness. Patient noted during the hospitalization to have low normal blood pressure and a such patient's Cardizem Lasix  and metoprolol were discontinued on discharge. Patient also noted to have swelling in the lower extremity as well as soft tissue swelling with a recent drop in hemoglobin and a such patient's anticoagulation was discontinued on discharge. Patient presented back to the ED with altered mental status and fall and noted to be in A. fib with RVR with heart rates in the 160s to the 200s. Patient placed on a Cardizem drip. Patient also noted to have a leukocytosis and chest x-ray with subtle opacity in the right middle lung lobe and a such patient placed on antibiotics due to hypotension for probable healthcare associated pneumonia.  Assessment: Chart reviewed; no PRN of Haldol or Ativan overnight. Her hemoglobin today is 8.3. She remains quite confused but is less combative  Recommendations/Plan:  Delirium: Improving. Continue as needed doses of Haldol; will reduce range to 1-2 mg every 6 hours as needed as patient is less combative however still quite confused. Continue Zyprexa 5 mg at bedtime and Ativan as needed. She continues on home meds of Lexapro and Remeron  Pain: She is complaining of pain to her right shoulder. We'll order Tylenol 500 mg orally as needed for mild pain or fever  Goals of Care and Additional Recommendations:  Limitations on Scope of Treatment: Avoid Hospitalization, Initiate Comfort Feeding, No Artificial Feeding, No Chemotherapy, No Radiation, No Surgical Procedures and No Tracheostomy  Code Status:    Code Status Orders        Start     Ordered   11/19/16 2339  Do not attempt resuscitation (DNR)  Continuous    Question Answer Comment  In the event of cardiac or respiratory ARREST Do not call a "code blue"   In the event of cardiac or respiratory ARREST Do not perform Intubation, CPR, defibrillation or ACLS   In the event of cardiac or respiratory ARREST Use medication by any route, position, wound care, and other measures to relive pain and suffering. May use oxygen,  suction and manual treatment of airway obstruction as needed for comfort.      11/19/16 2341    Code Status History    Date Active Date Inactive Code Status Order ID Comments User Context   10/28/2016  4:29 AM 10/28/2016  9:19 PM DNR [id number]  Eduard ClosKakrakandy, Arshad N, MD Inpatient   05/21/2016 11:50 PM 05/23/2016  5:46 PM Full Code [id number]  Hillary BowGardner, Jared M, DO ED       Prognosis:   < 6 months in the setting of moderate to severe dementia, large left lower extremity DVT, chronic kidney disease stage III and atrial fib.  Discharge Planning:  To Be Determined  Care plan was discussed with Dr. Linna DarnerAnwar  Thank you for allowing the Palliative Medicine Team to assist in the care of this patient.   Time In: 0900 Time Out: 0925 Total Time 25 min Prolonged Time Billed  no       Greater than 50%  of this time was spent counseling and coordinating care related to the above assessment and plan.  Irean HongSarah Grace Tajuanna Burnett, NP  Please contact Palliative Medicine Team phone at [phone] for questions and concerns.

## 2016-11-22 NOTE — Progress Notes (Signed)
ANTICOAGULATION CONSULT NOTE - Follow Up Consult  Pharmacy Consult for Heparin Indication: DVT (extensive LLE)  Allergies  Allergen Reactions  . Penicillins Swelling    Has patient had a PCN reaction causing immediate rash, facial/tongue/throat swelling, SOB or lightheadedness with hypotension: Yes Has patient had a PCN reaction causing severe rash involving mucus membranes or skin necrosis: No Has patient had a PCN reaction that required hospitalization unknown Has patient had a PCN reaction occurring within the last 10 years: No If all of the above answers are "NO", then may proceed with Cephalosporin use.  . Sulfa Antibiotics Hives and Swelling    Patient Measurements: Height: 5' (152.4 cm) (pt. unaware of height, height from previous admission) Weight: 119 lb 3.2 oz (54.1 kg) IBW/kg (Calculated) : 45.5 kg   Vital Signs: Temp: 98.8 F (37.1 C) (05/05 0823) Temp Source: Oral (05/05 0823) Pulse Rate: 112 (05/05 0823)  Labs:  Recent Labs  11/19/16 1940 11/20/16 0202 11/20/16 0707 11/20/16 1244 11/20/16 1604 11/21/16 1807 11/22/16 0435 11/22/16 1503  HGB 8.6*  --   --  8.0*  --   --  8.3*  --   HCT 29.2*  --   --  26.8*  26.3*  --   --  28.1*  --   PLT 324  --   --  260  --   --  319  --   APTT 24  --   --   --   --   --   --   --   LABPROT 14.4  --   --   --   --   --   --   --   INR 1.12  --   --   --   --   --   --   --   HEPARINUNFRC  --   --   --   --   --  <0.10* 0.52 0.77*  CREATININE 1.67*  --   --  1.27*  --   --  0.96  --   CKTOTAL  --   --   --   --  20*  --   --   --   TROPONINI  --  <0.03 <0.03 <0.03  --   --   --   --     Estimated Creatinine Clearance: 27.4 mL/min (by C-G formula based on SCr of 0.96 mg/dL).   Assessment: 83 year old female restarting heparin this AM for extensive LLE DVT. Heparin level high on gtt at 900 units/hr. Hgb low but stable, no bleeding noted.  Goal of Therapy:  Heparin level 0.3-0.7 units/ml Monitor platelets by  anticoagulation protocol: Yes   Plan:  Decrease heparin to 850 units / hr Next lvl 0000  Isaac BlissMichael Lakeidra Reliford, PharmD, BCPS, BCCCP Clinical Pharmacist Clinical phone for 11/22/2016 from 7a-3:30p: [phone] If after 3:30p, please call main pharmacy at: [phone] 11/22/2016 4:03 PM

## 2016-11-23 DIAGNOSIS — I824Y2 Acute embolism and thrombosis of unspecified deep veins of left proximal lower extremity: Secondary | ICD-10-CM

## 2016-11-23 LAB — CBC
HCT: 26 % — ABNORMAL LOW (ref 36.0–46.0)
Hemoglobin: 7.9 g/dL — ABNORMAL LOW (ref 12.0–15.0)
MCH: 24.3 pg — ABNORMAL LOW (ref 26.0–34.0)
MCHC: 30.4 g/dL (ref 30.0–36.0)
MCV: 80 fL (ref 78.0–100.0)
PLATELETS: 329 10*3/uL (ref 150–400)
RBC: 3.25 MIL/uL — ABNORMAL LOW (ref 3.87–5.11)
RDW: 15.9 % — AB (ref 11.5–15.5)
WBC: 9.6 10*3/uL (ref 4.0–10.5)

## 2016-11-23 LAB — HEPARIN LEVEL (UNFRACTIONATED)
HEPARIN UNFRACTIONATED: 0.5 [IU]/mL (ref 0.30–0.70)
HEPARIN UNFRACTIONATED: 0.72 [IU]/mL — AB (ref 0.30–0.70)
Heparin Unfractionated: 0.61 IU/mL (ref 0.30–0.70)

## 2016-11-23 LAB — RENAL FUNCTION PANEL
ALBUMIN: 2.4 g/dL — AB (ref 3.5–5.0)
Anion gap: 12 (ref 5–15)
BUN: 9 mg/dL (ref 6–20)
CO2: 21 mmol/L — AB (ref 22–32)
Calcium: 8.3 mg/dL — ABNORMAL LOW (ref 8.9–10.3)
Chloride: 105 mmol/L (ref 101–111)
Creatinine, Ser: 0.88 mg/dL (ref 0.44–1.00)
GFR calc Af Amer: >60 mL/min (ref >60)
GFR calc non Af Amer: 56 mL/min — ABNORMAL LOW (ref >60)
GLUCOSE: 94 mg/dL (ref 65–99)
POTASSIUM: 3.5 mmol/L (ref 3.5–5.1)
Phosphorus: 2.1 mg/dL — ABNORMAL LOW (ref 2.5–4.6)
SODIUM: 138 mmol/L (ref 135–145)

## 2016-11-23 MED ORDER — METOPROLOL TARTRATE 5 MG/5ML IV SOLN: 2.5000 mg | Freq: Three times a day (TID) | INTRAVENOUS | Status: DC

## 2016-11-23 MED ORDER — DEXTROSE 5 % IV SOLN
1.0000 g | Freq: Three times a day (TID) | INTRAVENOUS | Status: DC
  Administered 2016-11-23 – 2016-11-24 (×5): 1 g via INTRAVENOUS
  Filled 2016-11-23 (×7): qty 1

## 2016-11-23 MED ORDER — METOPROLOL TARTRATE 5 MG/5ML IV SOLN
2.5000 mg | Freq: Three times a day (TID) | INTRAVENOUS | Status: DC
  Administered 2016-11-23 – 2016-11-24 (×2): 2.5 mg via INTRAVENOUS
  Filled 2016-11-23 (×2): qty 5

## 2016-11-23 MED ORDER — MORPHINE SULFATE (CONCENTRATE) 10 MG/0.5ML PO SOLN
2.5000 mg | ORAL | Status: DC | PRN
  Administered 2016-11-24: 5 mg via ORAL
  Filled 2016-11-23: qty 4
  Filled 2016-11-23: qty 0.5

## 2016-11-23 MED ORDER — DILTIAZEM HCL 30 MG PO TABS
30.0000 mg | ORAL_TABLET | Freq: Three times a day (TID) | ORAL | Status: DC
  Administered 2016-11-23 – 2016-11-24 (×2): 30 mg via ORAL
  Filled 2016-11-23 (×2): qty 1

## 2016-11-23 MED ORDER — DILTIAZEM HCL 30 MG PO TABS
30.0000 mg | ORAL_TABLET | Freq: Three times a day (TID) | ORAL | Status: DC
  Filled 2016-11-23: qty 1

## 2016-11-23 MED ORDER — POTASSIUM PHOSPHATES 15 MMOLE/5ML IV SOLN
20.0000 meq | Freq: Once | INTRAVENOUS | Status: AC
  Administered 2016-11-23: 20 meq via INTRAVENOUS
  Filled 2016-11-23: qty 4.55

## 2016-11-23 NOTE — Progress Notes (Signed)
PROGRESS NOTE    Melanie Blake  ZOX:[id number] DOB: 1923-07-24 DOA: 11/19/2016 PCP: [facility]    Brief Narrative:  Patient is a 83 year old female history significant for dementia, GI bleed, chronic kidney disease stage III, atrial fibrillation not on anticoagulation, hypothyroidism, depression who was recently hospitalized from 10/27/2016-10/28/2016 secondary to a fall with generalized weakness and dizziness. Patient noted during the hospitalization to have low normal blood pressure and a such patient's Cardizem Lasix and metoprolol were discontinued on discharge. Patient also noted to have swelling in the lower extremity as well as soft tissue swelling with a recent drop in hemoglobin and a such patient's anticoagulation was discontinued on discharge. Patient presented back to the ED with altered mental status and fall and noted to be in A. fib with RVR with heart rates in the 160s to the 200s. Patient placed on a Cardizem drip. Patient also noted to have a leukocytosis and chest x-ray with subtle opacity in the right middle lung lobe and a such patient placed on antibiotics due to hypotension for probable healthcare associated pneumonia.   Assessment & Plan:   Principal Problem:   Atrial fibrillation with RVR (HCC) Active Problems:   Chronic diastolic CHF (congestive heart failure) (HCC)   Stage 3 chronic kidney disease   Abdominal tenderness   Hyperkalemia   Acute encephalopathy   Depression   HCAP (healthcare-associated pneumonia)   Abnormal CT of the abdomen   DVT (deep venous thrombosis) (HCC)   Palliative care by specialist    A. fib with RVR CHA2DS2VASC 4 Patient noted on presentation to be in A. fib with RVR.  -Cardizem drip -- changed to PO cardizem by cardiology -2-D echo pending -TSH at 1.907 - Cardiology following  chronic diastolic heart failure Stable. Patient seems compensated. Cardiology following.  probable HCAP On admission patient noted  to have a leukocytosis was tachycardic and chest x-ray with subtle opacity in the right middle lung lobe. Urine Legionella antigen /urine pneumococcus antigen negative -Blood cultures pending. -IV abx  1/2 blood cultures + for micrococcus   acute encephalopathy due to dementia/infection Patient with a baseline dementia.   hyperkalemia On admission potassium was 5.6. EKG with no peak T waves. -resolved  depression Stable. Resumed Lexapro and Remeron.   hypothyroidism Continue IV Synthroid.  dysphagia Per nursing patient choked while trying to drink tea this morning. Continue current dysphagia diet. Speech therapy evaluation- patient unable to work with SLP today due to lethargy  chronic kidney disease stage III Baseline creatinine 1.4-1.8. Renal function improved and better than baseline.  abdominal tenderness resolved  abnormal CT findings Secondary to fall. CT abdomen and pelvis with a large amount of subcutaneous and soft tissue edema extending from the left lateral chest wall, inferiorly over the left hip. Diffuse soft tissue edema surrounding the left hip musculature and involving the left proximal thigh. No CT evidence of acute intra-abdominal or pelvic pathology. Patient was seen in consultation by trauma surgery who recommended monitoring for now.   Extensive left lower extremity DVT Patient underwent lower extremity Dopplers which showed an extensive left lower extremity DVT from the left, femoral vein throughout the extremity. Left iliac appear thrombosed.  -Consulted vascular surgery, Dr. Randie Heinz who came and assessed the patient and in discussion with Dr. Violeta Gelinas of trauma surgery recommended patient be placed on anticoagulation with IV heparin with close monitoring of hematoma and following patient's H&H. If patient's hemoglobin drops then will need to discontinue IV heparin and place IVC  filter. Patient was started on heparin drip however this was discontinued by  cardiology on 11/20/2016, however has been subsequently resumed.  -not sure patient could tolerate IVC filter -family meeting with palliative care this AM--- recommend comfort care   fall Patient with fall 3 weeks ago with left leg injury with no bony fracture on the leg. Patient with no head injury. CT head and neck negative for any acute abnormalities. Lower extremity Dopplers consistent with extensive large left lower extremity DVT -patient is at an increased risk of falling, would not think she could be anticoagulated for this DVT due to fall risk      DVT prophylaxis: SCDs Code Status: DO NOT RESUSCITATE Family Communication: family meeting with palliative care Disposition Plan:    Consultants:   Vascular surgery: Dr. Randie Heinz 11/20/2016  Cardiology: Dr. Mayford Knife 11/20/2016  Trauma: Dr. Violeta Gelinas 11/20/2016  Palliative care: Dr. Linna Darner 11/21/2016  Procedures:   CT abdomen and pelvis 11/20/2016  CT head CT C-spine 11/20/2016  Chest x-ray 11/19/2016  Plain films of Left tib-fib 11/19/2016  Lower extremity Dopplers 11/20/2016  2-D echo 11/21/2016--pending  Antimicrobials:   IV vancomycin 11/19/2016>>>> 11/21/2016  IV Levaquin 11/19/2016>>>> 11/21/2016  IV Azactam 11/19/2016>>> 11/20/2016  IV Azactam 11/21/2016   Subjective: Patient only moans in response to me  Objective: Vitals:   11/22/16 2005 11/23/16 0045 11/23/16 0417 11/23/16 1118  BP: 124/63  129/71 (!) 121/58  Pulse: 76 (!) 110 97 (!) 111  Resp: (!) 23 19 20 16   Temp: 97.6 F (36.4 C) 98 F (36.7 C) 98.5 F (36.9 C)   TempSrc: Axillary Axillary Axillary   SpO2: 91%  100% 95%  Weight:   52.8 kg (116 lb 6.4 oz)   Height:        Intake/Output Summary (Last 24 hours) at 11/23/16 1128 Last data filed at 11/23/16 0854  Gross per 24 hour  Intake            317.5 ml  Output                0 ml  Net            317.5 ml   Filed Weights   11/20/16 0159 11/22/16 0329 11/23/16 0417  Weight:  52.9 kg (116 lb 9.6 oz) 54.1 kg (119 lb 3.2 oz) 52.8 kg (116 lb 6.4 oz)    Examination:  General exam: elderly, frail appearing Respiratory system: diminished, poor effort Cardiovascular system: irregular, + murmur Gastrointestinal system: Abdomen is nondistended, soft and nontender. No organomegaly or masses felt. Normal bowel sounds heard. Central nervous system: sleepy- not cooperative for exam Extremities: Left lower extremity with ecchymosis and hematoma. Skin: Patient with diffuse ecchymosis on extremities.     Data Reviewed: I have personally reviewed following labs and imaging studies  CBC:  Recent Labs Lab 11/19/16 1940 11/20/16 1244 11/22/16 0435 11/23/16 0232  WBC 13.6* 8.2 10.2 9.6  NEUTROABS 12.0* 6.7  --   --   HGB 8.6* 8.0* 8.3* 7.9*  HCT 29.2* 26.8*  26.3* 28.1* 26.0*  MCV 81.8 80.5 80.5 80.0  PLT 324 260 319 329   Basic Metabolic Panel:  Recent Labs Lab 11/19/16 1940 11/20/16 1244 11/22/16 0435 11/23/16 0232  NA 135 135 137 138  K 5.6* 4.5 4.0 3.5  CL 99* 101 103 105  CO2 23 26 17* 21*  GLUCOSE 102* 102* 80 94  BUN 19 16 12 9   CREATININE 1.67* 1.27* 0.96 0.88  CALCIUM 9.2 8.5*  8.5* 8.3*  MG  --  2.2  --   --   PHOS  --   --   --  2.1*   GFR: Estimated Creatinine Clearance: 29.9 mL/min (by C-G formula based on SCr of 0.88 mg/dL). Liver Function Tests:  Recent Labs Lab 11/19/16 1940 11/23/16 0232  AST 27  --   ALT 12*  --   ALKPHOS 109  --   BILITOT 1.0  --   PROT 6.9  --   ALBUMIN 3.0* 2.4*    Recent Labs Lab 11/20/16 0011  LIPASE 23    Recent Labs Lab 11/20/16 1244  AMMONIA 17   Coagulation Profile:  Recent Labs Lab 11/19/16 1940  INR 1.12   Cardiac Enzymes:  Recent Labs Lab 11/20/16 0202 11/20/16 0707 11/20/16 1244 11/20/16 1604  CKTOTAL  --   --   --  20*  TROPONINI <0.03 <0.03 <0.03  --    BNP (last 3 results) No results for input(s): PROBNP in the last 8760 hours. HbA1C: No results for input(s):  HGBA1C in the last 72 hours. CBG: No results for input(s): GLUCAP in the last 168 hours. Lipid Profile: No results for input(s): CHOL, HDL, LDLCALC, TRIG, CHOLHDL, LDLDIRECT in the last 72 hours. Thyroid Function Tests: No results for input(s): TSH, T4TOTAL, FREET4, T3FREE, THYROIDAB in the last 72 hours. Anemia Panel:  Recent Labs  11/20/16 1244  VITAMINB12 673   Sepsis Labs:  Recent Labs Lab 11/19/16 2210  LATICACIDVEN 1.18    Recent Results (from the past 240 hour(s))  Culture, blood (Routine X 2) w Reflex to ID Panel     Status: Abnormal   Collection Time: 11/19/16  9:50 PM  Result Value Ref Range Status   Specimen Description BLOOD LEFT ANTECUBITAL  Final   Special Requests   Final    BOTTLES DRAWN AEROBIC AND ANAEROBIC Blood Culture adequate volume   Culture  Setup Time   Final    GRAM POSITIVE COCCI IN CLUSTERS AEROBIC BOTTLE ONLY CRITICAL RESULT CALLED TO, READ BACK BY AND VERIFIED WITH: T. DANG, RPHARMD AT 1550 ON 11/21/16 BY C. JESSUP, MLT.    Culture (A)  Final    MICROCOCCUS SPECIES Standardized susceptibility testing for this organism is not available.    Report Status 11/22/2016 FINAL  Final  Blood Culture ID Panel (Reflexed)     Status: None   Collection Time: 11/19/16  9:50 PM  Result Value Ref Range Status   Enterococcus species NOT DETECTED NOT DETECTED Final   Listeria monocytogenes NOT DETECTED NOT DETECTED Final   Staphylococcus species NOT DETECTED NOT DETECTED Final   Staphylococcus aureus NOT DETECTED NOT DETECTED Final   Streptococcus species NOT DETECTED NOT DETECTED Final   Streptococcus agalactiae NOT DETECTED NOT DETECTED Final   Streptococcus pneumoniae NOT DETECTED NOT DETECTED Final   Streptococcus pyogenes NOT DETECTED NOT DETECTED Final   Acinetobacter baumannii NOT DETECTED NOT DETECTED Final   Enterobacteriaceae species NOT DETECTED NOT DETECTED Final   Enterobacter cloacae complex NOT DETECTED NOT DETECTED Final   Escherichia  coli NOT DETECTED NOT DETECTED Final   Klebsiella oxytoca NOT DETECTED NOT DETECTED Final   Klebsiella pneumoniae NOT DETECTED NOT DETECTED Final   Proteus species NOT DETECTED NOT DETECTED Final   Serratia marcescens NOT DETECTED NOT DETECTED Final   Haemophilus influenzae NOT DETECTED NOT DETECTED Final   Neisseria meningitidis NOT DETECTED NOT DETECTED Final   Pseudomonas aeruginosa NOT DETECTED NOT DETECTED Final   Candida albicans NOT  DETECTED NOT DETECTED Final   Candida glabrata NOT DETECTED NOT DETECTED Final   Candida krusei NOT DETECTED NOT DETECTED Final   Candida parapsilosis NOT DETECTED NOT DETECTED Final   Candida tropicalis NOT DETECTED NOT DETECTED Final  Culture, blood (Routine X 2) w Reflex to ID Panel     Status: None (Preliminary result)   Collection Time: 11/19/16  9:55 PM  Result Value Ref Range Status   Specimen Description BLOOD  Final   Special Requests   Final    BOTTLES DRAWN AEROBIC ONLY Blood Culture adequate volume   Culture NO GROWTH 3 DAYS  Final   Report Status PENDING  Incomplete         Radiology Studies: No results found.      Scheduled Meds: . diltiazem  30 mg Oral Q8H  . escitalopram  10 mg Oral QHS  . levothyroxine  50 mcg Oral QAC breakfast  . mouth rinse  15 mL Mouth Rinse BID  . mirtazapine  15 mg Oral QHS  . OLANZapine zydis  5 mg Oral QHS  . pantoprazole sodium  40 mg Oral Daily  . sodium bicarbonate  650 mg Oral BID   Continuous Infusions: . aztreonam 1 g (11/23/16 0939)  . heparin 800 Units/hr (11/23/16 0058)     LOS: 4 days    Time spent: 35 minutes    Melanie Navedo U Adeola Dennen, DO [facility] Pager [phone]  If 7PM-7AM, please contact night-coverage [email or web address] Password [email or web address] 11/23/2016, 11:28 AM

## 2016-11-23 NOTE — Progress Notes (Signed)
ANTICOAGULATION CONSULT NOTE - Follow Up Consult  Pharmacy Consult for Heparin Indication: DVT (extensive LLE)  Allergies  Allergen Reactions  . Penicillins Swelling    Has patient had a PCN reaction causing immediate rash, facial/tongue/throat swelling, SOB or lightheadedness with hypotension: Yes Has patient had a PCN reaction causing severe rash involving mucus membranes or skin necrosis: No Has patient had a PCN reaction that required hospitalization unknown Has patient had a PCN reaction occurring within the last 10 years: No If all of the above answers are "NO", then may proceed with Cephalosporin use.  . Sulfa Antibiotics Hives and Swelling    Patient Measurements: Height: 5' (152.4 cm) (pt. unaware of height, height from previous admission) Weight: 119 lb 3.2 oz (54.1 kg) IBW/kg (Calculated) : 45.5 kg   Vital Signs: Temp: 97.6 F (36.4 C) (05/05 2005) Temp Source: Axillary (05/05 2005) BP: 124/63 (05/05 2005) Pulse Rate: 76 (05/05 2005)  Labs:  Recent Labs  11/20/16 0202 11/20/16 0707 11/20/16 1244 11/20/16 1604  11/22/16 0435 11/22/16 1503 11/22/16 2352  HGB  --   --  8.0*  --   --  8.3*  --   --   HCT  --   --  26.8*  26.3*  --   --  28.1*  --   --   PLT  --   --  260  --   --  319  --   --   HEPARINUNFRC  --   --   --   --   < > 0.52 0.77* 0.72*  CREATININE  --   --  1.27*  --   --  0.96  --   --   CKTOTAL  --   --   --  20*  --   --   --   --   TROPONINI <0.03 <0.03 <0.03  --   --   --   --   --   < > = values in this interval not displayed.  Estimated Creatinine Clearance: 27.4 mL/min (by C-G formula based on SCr of 0.96 mg/dL).   Assessment: 83 year old female restarting heparin this AM for extensive LLE DVT. Heparin level slightly high (0.72) after rate reduction. Hgb low but stable, no bleeding noted.  Goal of Therapy:  Heparin level 0.3-0.7 units/ml Monitor platelets by anticoagulation protocol: Yes   Plan:  Decrease heparin to 800  units/hr  Daily heparin level/CBC Monitor for s/sx of bleeding  Ruben Imony Akari Crysler, PharmD Clinical Pharmacist 11/23/2016 12:55 AM

## 2016-11-23 NOTE — Progress Notes (Signed)
Daily Progress Note   Patient Name: Melanie Blake       Date: 11/23/2016 DOB: 1923/09/11  Age: 83 y.o. MRN#: [id number] Attending Physician: Geradine Girt, DO Primary Care Physician: Orvis Brill, [facility] Admit Date: 11/19/2016  Reason for Consultation/Follow-up: Inpatient hospice referral, Non pain symptom management, Pain control and Psychosocial/spiritual support  Subjective: Patient states "I hurt all over". She appears more lethargic today. Per nursing she's been restless through most of the night trying to get out of the bed. She is not combative and is cooperating with care despite even small things such as taking her blood pressure eliciting pain.  Length of Stay: 4  Current Medications: Scheduled Meds:  . diltiazem  30 mg Oral Q8H  . escitalopram  10 mg Oral QHS  . levothyroxine  50 mcg Oral QAC breakfast  . mouth rinse  15 mL Mouth Rinse BID  . mirtazapine  15 mg Oral QHS  . OLANZapine zydis  5 mg Oral QHS  . pantoprazole sodium  40 mg Oral Daily  . sodium bicarbonate  650 mg Oral BID    Continuous Infusions: . aztreonam 1 g (11/23/16 0939)  . heparin 800 Units/hr (11/23/16 0058)  . potassium phosphate IVPB (mEq)      PRN Meds: acetaminophen, acetaminophen, albuterol, haloperidol lactate, LORazepam, morphine, ondansetron, traMADol  Physical Exam  Constitutional:  Frail elderly female. Very restless. Appears to be in pain  Cardiovascular:  Atrial fibrillation  Pulmonary/Chest:  Mild increased work of breathing at rest  Musculoskeletal: Normal range of motion. She exhibits edema.  Severe kyphosis Extensive Left leg DVT with bruising, hematoma, edema  Neurological:  Somnolent  Psychiatric:  Restless  Nursing note and vitals reviewed.           Vital  Signs: BP (!) 121/58 (BP Location: Right Arm)   Pulse (!) 111   Temp 98.5 F (36.9 C) (Axillary)   Resp 16   Ht 5' (1.524 m) Comment: pt. unaware of height, height from previous admission  Wt 52.8 kg (116 lb 6.4 oz)   SpO2 95%   BMI 22.73 kg/m  SpO2: SpO2: 95 % O2 Device: O2 Device: Nasal Cannula O2 Flow Rate: O2 Flow Rate (L/min): 2.5 L/min  Intake/output summary:  Intake/Output Summary (Last 24 hours) at 11/23/16 1231 Last data filed at 11/23/16  0854  Gross per 24 hour  Intake            217.5 ml  Output                0 ml  Net            217.5 ml   LBM: Last BM Date: 11/20/16 Baseline Weight: Weight: 52.9 kg (116 lb 9.6 oz) Most recent weight: Weight: 52.8 kg (116 lb 6.4 oz)       Palliative Assessment/Data:    Flowsheet Rows     Most Recent Value  Intake Tab  Referral Department  Hospitalist  Unit at Time of Referral  Cardiac/Telemetry Unit  Palliative Care Primary Diagnosis  Cardiac  Date Notified  11/20/16  Palliative Care Type  New Palliative care  Reason for referral  Clarify Goals of Care, Non-pain Symptom, Psychosocial or Spiritual support, Counsel Regarding Hospice  Date of Admission  11/19/16  Date first seen by Palliative Care  11/21/16  # of days Palliative referral response time  1 Day(s)  # of days IP prior to Palliative referral  1  Clinical Assessment  Palliative Performance Scale Score  30%  Pain Max last 24 hours  Not able to report  Pain Min Last 24 hours  Not able to report  Dyspnea Max Last 24 Hours  Not able to report  Dyspnea Min Last 24 hours  Not able to report  Nausea Max Last 24 Hours  Not able to report  Nausea Min Last 24 Hours  Not able to report  Anxiety Max Last 24 Hours  Not able to report  Anxiety Min Last 24 Hours  Not able to report  Other Max Last 24 Hours  Not able to report  Psychosocial & Spiritual Assessment  Palliative Care Outcomes  Patient/Family meeting held?  Yes  Who was at the meeting?  daughter via phone    Palliative Care Outcomes  Improved non-pain symptom therapy, Counseled regarding hospice, Provided psychosocial or spiritual support, Clarified goals of care  Patient/Family wishes: Interventions discontinued/not started   Mechanical Ventilation, Hemodialysis, Vasopressors, Trach, Tube feedings/TPN, PEG  Palliative Care follow-up planned  Yes, Facility      Patient Active Problem List   Diagnosis Date Noted  . DVT (deep venous thrombosis) ([place]) 11/21/2016  . Palliative care by specialist   . Abnormal CT of the abdomen   . Abdominal tenderness 11/19/2016  . Hyperkalemia 11/19/2016  . Acute encephalopathy 11/19/2016  . Depression 11/19/2016  . HCAP (healthcare-associated pneumonia) 11/19/2016  . Abdominal pain   . Left leg swelling   . Gastrointestinal hemorrhage 10/28/2016  . Acute GI bleeding 10/28/2016  . Acute blood loss anemia 10/27/2016  . UTI (urinary tract infection) 05/21/2016  . Generalized weakness 05/21/2016  . Altered mental status 05/21/2016  . Stage 3 chronic kidney disease 11/13/2013  . Atrial fibrillation ([place]) 11/11/2012  . Chronic diastolic heart failure ([place]) 11/11/2012  . Atrial fibrillation with RVR ([place]) 10/28/2012  . Chronic diastolic CHF (congestive heart failure) ([place]) 10/28/2012  . Elevated TSH 10/28/2012    Palliative Care Assessment & Plan   Patient Profile: Patient is a 83 year old female history significant for dementia, GI bleed, chronic kidney disease stage III, atrial fibrillation not on anticoagulation, hypothyroidism, depression who was recently hospitalized from 10/27/2016-10/28/2016 secondary to a fall with generalized weakness and dizziness. Patient noted during the hospitalization to have low normal blood pressure and a such patient's Cardizem Lasix and metoprolol were discontinued on discharge.  Patient also noted to have swelling in the lower extremity as well as soft tissue swelling with a recent drop in hemoglobin and a such patient's  anticoagulation was discontinued on discharge. Patient presented back to the ED with altered mental status and fall and noted to be in A. fib with RVR with heart rates in the 160s to the 200s. Patient placed on a Cardizem drip. Patient also noted to have a leukocytosis and chest x-ray with subtle opacity in the right middle lung lobe and a such patient placed on antibiotics due to hypotension for probable healthcare associated pneumonia. Her hemoglobin has dropped slightly today to 7.9, she appears more lethargic and is verbalizing more pain  Assessment: As noted above patient is more lethargic has been restless to most of the night and is verbalizing more pain. She did receive tramadol with no effect. Her heart rate is better controlled on Cardizem drip which they are going to transition her to oral medications today. Patient however is only taking bites and sips and likely will not be able to take oral medications much longer.  Met with her niece, Darrin Luis, who is her healthcare power of attorney, to review goals of care and disposition options. Mrs. Lucia Gaskins shares with me that her aunt has had severe pain for months now, and she doesn't want to see her suffer anymore. We discussed inpatient hospice for end-of-life care. She was in agreement with this plan  Recommendations/Plan:  Consult placed to social work for transfer residential hospice . I offered choice and families first choice is [facility] [place]. I did call their hospital representative to begin referral process  We'll add morphine concentrate as she is having severe pain, 2.5-5 mg every 4 hours as needed.  Delirium: Improving in that patient is not combative but her restlessness is quite severe and I think this is driven by unmanaged pain; continue with Zyprexa at bedtime. Question if what we're seeing is terminal agitation  While in the hospital continue to treat the treatable and maximize her  clinical status. Niece is aware that anticoagulation will likely have to be stopped as her hemoglobin is beginning to drop again and she is very restless and high risk for another fall  Goals of Care and Additional Recommendations:  Limitations on Scope of Treatment: Avoid Hospitalization, Minimize Medications, Initiate Comfort Feeding, No Artificial Feeding, No Blood Transfusions, No Chemotherapy, No Diagnostics, No Hemodialysis, No Radiation, No Surgical Procedures and No Tracheostomy  Code Status:    Code Status Orders        Start     Ordered   11/19/16 2339  Do not attempt resuscitation (DNR)  Continuous    Question Answer Comment  In the event of cardiac or respiratory ARREST Do not call a "code blue"   In the event of cardiac or respiratory ARREST Do not perform Intubation, CPR, defibrillation or ACLS   In the event of cardiac or respiratory ARREST Use medication by any route, position, wound care, and other measures to relive pain and suffering. May use oxygen, suction and manual treatment of airway obstruction as needed for comfort.      11/19/16 2341    Code Status History    Date Active Date Inactive Code Status Order ID Comments User Context   10/28/2016  4:29 AM 10/28/2016  9:19 PM DNR [id number]  Rise Patience, MD Inpatient   05/21/2016 11:50 PM 05/23/2016  5:46 PM Full Code [id number]  Etta Quill, DO ED       Prognosis:   < 4 weeks in the setting of moderate to severe dementia, significant left lower extremity DVT (not a candidate for anticoagulation secondary to falls as well as GI bleed), niece does not wish to pursue IVC filter, dysphagia with aspiration pneumonia, no longer taking anything by mouth, unmanaged pain as well as delirium likely secondary to unmanaged pain, May Thurner Syndrome. Patient is at very high risk for an acute event secondary to coagulopathy  Discharge Planning:  Hospice facility  Care plan was discussed with Dr Eliseo Squires  Thank you  for allowing the Palliative Medicine Team to assist in the care of this patient.   Time In: 1100 Time Out: 1150 Total Time 50 min Prolonged Time Billed  no       Greater than 50%  of this time was spent counseling and coordinating care related to the above assessment and plan.  Dory Horn, NP  Please contact Palliative Medicine Team phone at [phone] for questions and concerns.

## 2016-11-23 NOTE — Progress Notes (Signed)
SLP Cancellation Note  Patient Details Name: Melanie PedroBettie M Kramme MRN: [id number] DOB: 01/16/1924   Cancelled treatment:       Reason Eval/Treat Not Completed: Fatigue/lethargy limiting ability to participate. Unable to rouse pt. RN reports she was awake all night. Will f/u today as able if alertness improves.  Rondel BatonMary Beth Idelia Caudell, TennesseeMS, CCC-SLP Speech-Language Pathologist [phone]   Arlana LindauMary E Oumar Marcott 11/23/2016, 9:22 AM

## 2016-11-23 NOTE — Progress Notes (Signed)
Telemetry and chart reviewed, we have been managing the patient's afib in setting of HCAP. She has been on dilt gtt. Has not been on longterm anticoag due to history of GI bleed, in hospital started on hep gtt due to DVT. Palliative care consulted yesterday, she has been made DNR. Has had issues with delerium. Heart rates continue to improve, 70-100s on dilt gtt at 5, we will transition to oral dilt today 30mg  po q 8 hrs. Hopefully as pneumonia improves less tachcyardia drive.    Melanie FerryJ Launa Goedken MD

## 2016-11-23 NOTE — Progress Notes (Signed)
ANTICOAGULATION CONSULT NOTE - Follow Up Consult  Pharmacy Consult for Heparin Indication: DVT (extensive LLE)  Allergies  Allergen Reactions  . Penicillins Swelling    Has patient had a PCN reaction causing immediate rash, facial/tongue/throat swelling, SOB or lightheadedness with hypotension: Yes Has patient had a PCN reaction causing severe rash involving mucus membranes or skin necrosis: No Has patient had a PCN reaction that required hospitalization unknown Has patient had a PCN reaction occurring within the last 10 years: No If all of the above answers are "NO", then may proceed with Cephalosporin use.  . Sulfa Antibiotics Hives and Swelling    Patient Measurements: Height: 5' (152.4 cm) (pt. unaware of height, height from previous admission) Weight: 116 lb 6.4 oz (52.8 kg) IBW/kg (Calculated) : 45.5 kg   Vital Signs: Temp: 98.5 F (36.9 C) (05/06 0417) Temp Source: Axillary (05/06 0417) BP: 121/58 (05/06 1118) Pulse Rate: 111 (05/06 1118)  Labs:  Recent Labs  11/20/16 1244 11/20/16 1604  11/22/16 0435  11/22/16 2352 11/23/16 0232 11/23/16 1039  HGB 8.0*  --   --  8.3*  --   --  7.9*  --   HCT 26.8*  26.3*  --   --  28.1*  --   --  26.0*  --   PLT 260  --   --  319  --   --  329  --   HEPARINUNFRC  --   --   < > 0.52  < > 0.72* 0.61 0.50  CREATININE 1.27*  --   --  0.96  --   --  0.88  --   CKTOTAL  --  20*  --   --   --   --   --   --   TROPONINI <0.03  --   --   --   --   --   --   --   < > = values in this interval not displayed.  Estimated Creatinine Clearance: 29.9 mL/min (by C-G formula based on SCr of 0.88 mg/dL).   Assessment: 83 year old female to continue on IV heparin for extensive LLE DVT. Heparin level therapeutic at 0.50 on 800 units/hr. Hgb down 7.9, Plt wnl, no bleeding noted.  Goal of Therapy:  Heparin level 0.3-0.7 units/ml Monitor platelets by anticoagulation protocol: Yes   Plan:  Continue heparin at 800 units/hr  Daily heparin  level/CBC Monitor for s/sx of bleeding   Mackie Paienee Kumiko Fishman, PharmD PGY1 Pharmacy Resident 11/23/2016 11:59 AM

## 2016-11-23 NOTE — Progress Notes (Signed)
Pharmacy Antibiotic Note  Melanie Blake is a 83 y.o. female admitted on 11/19/2016 with pneumonia.  Pharmacy has been consulted for aztreonam dosing (patient noted to have PCN allergy). Today is day 5 of antibiotics. Patient is afebrile, WBC down to 9.6, CrCl improved to ~30 ml/min  Plan: Increase aztreonam to 1g IV Q8H Continue abx through 5/9 to complete 8 days Monitor renal function  Height: 5' (152.4 cm) (pt. unaware of height, height from previous admission) Weight: 116 lb 6.4 oz (52.8 kg) IBW/kg (Calculated) : 45.5  Temp (24hrs), Avg:98 F (36.7 C), Min:97.6 F (36.4 C), Max:98.5 F (36.9 C)   Recent Labs Lab 11/19/16 1940 11/19/16 2210 11/20/16 1244 11/22/16 0435 11/23/16 0232  WBC 13.6*  --  8.2 10.2 9.6  CREATININE 1.67*  --  1.27* 0.96 0.88  LATICACIDVEN  --  1.18  --   --   --     Estimated Creatinine Clearance: 29.9 mL/min (by C-G formula based on SCr of 0.88 mg/dL).    Allergies  Allergen Reactions  . Penicillins Swelling    Has patient had a PCN reaction causing immediate rash, facial/tongue/throat swelling, SOB or lightheadedness with hypotension: Yes Has patient had a PCN reaction causing severe rash involving mucus membranes or skin necrosis: No Has patient had a PCN reaction that required hospitalization unknown Has patient had a PCN reaction occurring within the last 10 years: No If all of the above answers are "NO", then may proceed with Cephalosporin use.  . Sulfa Antibiotics Hives and Swelling    Antimicrobials this admission: 5/2 Levaquin x1 5/3 Vanc x1 5/3 Aztreonam >>  Microbiology results: 5/2 BCx: 1/2 Micrococcus species 5/2 Legionella urine: negative 5/2 Strep pneumo urine: negative  Thank you for allowing pharmacy to be a part of this patient's care.   Mackie Paienee Ackley, PharmD PGY1 Pharmacy Resident 11/23/2016 8:59 AM

## 2016-11-24 LAB — CBC
HCT: 26.8 % — ABNORMAL LOW (ref 36.0–46.0)
Hemoglobin: 8 g/dL — ABNORMAL LOW (ref 12.0–15.0)
MCH: 23.8 pg — AB (ref 26.0–34.0)
MCHC: 29.9 g/dL — ABNORMAL LOW (ref 30.0–36.0)
MCV: 79.8 fL (ref 78.0–100.0)
PLATELETS: 342 10*3/uL (ref 150–400)
RBC: 3.36 MIL/uL — ABNORMAL LOW (ref 3.87–5.11)
RDW: 16 % — AB (ref 11.5–15.5)
WBC: 9.4 10*3/uL (ref 4.0–10.5)

## 2016-11-24 LAB — CULTURE, BLOOD (ROUTINE X 2)
Culture: NO GROWTH
SPECIAL REQUESTS: ADEQUATE

## 2016-11-24 LAB — HEPARIN LEVEL (UNFRACTIONATED): HEPARIN UNFRACTIONATED: 0.39 [IU]/mL (ref 0.30–0.70)

## 2016-11-24 MED ORDER — DILTIAZEM HCL ER 60 MG PO CP12
120.0000 mg | ORAL_CAPSULE | Freq: Two times a day (BID) | ORAL | Status: DC
  Administered 2016-11-24: 120 mg via ORAL
  Filled 2016-11-24 (×2): qty 2

## 2016-11-24 MED ORDER — MORPHINE SULFATE (CONCENTRATE) 10 MG/0.5ML PO SOLN: 2.5000 mg | ORAL | 0 refills | Status: AC | PRN

## 2016-11-24 MED ORDER — DILTIAZEM HCL ER 120 MG PO CP12: 120.0000 mg | ORAL_CAPSULE | Freq: Two times a day (BID) | ORAL | 0 refills | Status: AC

## 2016-11-24 MED ORDER — LORAZEPAM 0.5 MG PO TABS: 0.5000 mg | ORAL_TABLET | Freq: Three times a day (TID) | ORAL | 0 refills | Status: AC

## 2016-11-24 MED ORDER — OLANZAPINE 5 MG PO TBDP: 5.0000 mg | ORAL_TABLET | Freq: Every day | ORAL | 0 refills | Status: AC

## 2016-11-24 MED ORDER — DILTIAZEM HCL ER 90 MG PO CP12: 90.0000 mg | ORAL_CAPSULE | Freq: Two times a day (BID) | ORAL | Status: DC

## 2016-11-24 MED ORDER — FUROSEMIDE 10 MG/ML IJ SOLN
20.0000 mg | Freq: Once | INTRAMUSCULAR | Status: AC
  Administered 2016-11-24: 20 mg via INTRAVENOUS
  Filled 2016-11-24: qty 2

## 2016-11-24 MED ORDER — PANTOPRAZOLE SODIUM 40 MG PO PACK: 40.0000 mg | PACK | Freq: Every day | ORAL | 0 refills | Status: AC

## 2016-11-24 NOTE — Care Management Important Message (Signed)
Important Message  Patient Details  Name: Melanie Blake MRN: [id number] Date of Birth: 04/29/1924   Medicare Important Message Given:  Yes    [name] Abena 11/24/2016, 2:07 PM

## 2016-11-24 NOTE — Care Management Note (Signed)
Case Management Note  Patient Details  Name: Weyman PedroBettie M Nylen MRN: [id number] Date of Birth: 03/04/1924  Subjective/Objective: Pt presented for AMS and Atrial Fib. Palliative Care consulting- plan for Residential Hospice.                     Action/Plan: CSW assisting with disposition needs. No further needs from CM at this time.   Expected Discharge Date:  11/24/16               Expected Discharge Plan:  Hospice Medical Facility  In-House Referral:  Clinical Social Work  Discharge planning Services  CM Consult  Post Acute Care Choice:  NA Choice offered to:  NA  DME Arranged:  N/A DME Agency:  NA  HH Arranged:  NA HH Agency:  NA  Status of Service:  Completed, signed off  If discussed at [employer] Length of Stay Meetings, dates discussed:    Additional Comments:  Gala LewandowskyGraves-Bigelow, Octavius Shin Kaye, RN 11/24/2016, 2:14 PM

## 2016-11-24 NOTE — Consult Note (Signed)
[place]:  Met with pt and discussed with her and her family hospice home philosophy. She crys out multiple time sin pain and has needed 2 doses of morphine while here this visit to help maintain her comfort. Family is in agreement with hospice philopshy wanting to focus on pure comfort. MSW notified and pt was approved by our medical director for [facility]. MSW called [facility] at 400pm. Webb Silversmith RN

## 2016-11-24 NOTE — Progress Notes (Signed)
CSW consulted for hospice placement- per palliative consult [facility]  CSW spoke with representative this morning- she will follow up with family first thing today for hopeful transfer to hospice today  CSW will continue to follow  Burna SisJenna H. Anterio Scheel, LCSW Clinical Social Worker [phone]

## 2016-11-24 NOTE — Progress Notes (Signed)
ANTICOAGULATION CONSULT NOTE - Follow Up Consult  Pharmacy Consult for Heparin Indication: DVT (extensive LLE)  Allergies  Allergen Reactions  . Penicillins Swelling    Has patient had a PCN reaction causing immediate rash, facial/tongue/throat swelling, SOB or lightheadedness with hypotension: Yes Has patient had a PCN reaction causing severe rash involving mucus membranes or skin necrosis: No Has patient had a PCN reaction that required hospitalization unknown Has patient had a PCN reaction occurring within the last 10 years: No If all of the above answers are "NO", then may proceed with Cephalosporin use.  . Sulfa Antibiotics Hives and Swelling    Patient Measurements: Height: 5' (152.4 cm) (pt. unaware of height, height from previous admission) Weight: 117 lb 11.2 oz (53.4 kg) IBW/kg (Calculated) : 45.5 kg   Vital Signs: Temp: 99.1 F (37.3 C) (05/07 0746) Temp Source: Oral (05/07 0746) BP: 117/65 (05/07 1141) Pulse Rate: 139 (05/07 1141)  Labs:  Recent Labs  11/22/16 0435  11/23/16 0232 11/23/16 1039 11/24/16 0433  HGB 8.3*  --  7.9*  --  8.0*  HCT 28.1*  --  26.0*  --  26.8*  PLT 319  --  329  --  342  HEPARINUNFRC 0.52  < > 0.61 0.50 0.39  CREATININE 0.96  --  0.88  --   --   < > = values in this interval not displayed.  Estimated Creatinine Clearance: 29.9 mL/min (by C-G formula based on SCr of 0.88 mg/dL).   Assessment: 83 year old female on IV heparin drip for extensive LLE DVT. Heparin level is 0.39, therapeutic on Heparin rate 800 units/hr. Hgb low but stable, PLTC is wnl. No bleeding noted. MD noted patient is not a candidate for anticoagulation secondary to falls as well as GI bleed.  Possible discharge to outpatient hospice today.    Goal of Therapy:  Heparin level 0.3-0.7 units/ml Monitor platelets by anticoagulation protocol: Yes   Plan:  Continue IV heparin 800 units/hr  Daily heparin level/CBC Monitor for s/sx of bleeding  Noah DelaineRuth Rosmary Dionisio,  RPh Clinical Pharmacist [id number]-[id number] [phone] [phone] Main Pharmacy [phone] 11/24/2016 12:48 PM

## 2016-11-24 NOTE — Progress Notes (Signed)
[facility] Anticipated discharge date: 5/7 Family notified: at bedside Transportation by [facility]- called at 4pm  CSW signing off.  Burna SisJenna H. Amiyah Shryock, LCSW Clinical Social Worker [phone]

## 2016-11-24 NOTE — Discharge Summary (Signed)
Physician Discharge Summary  Melanie Blake ULA:[id number] DOB: [birth year] DOA: 11/19/2016  PCP: Orvis Brill, [facility]  Admit date: 11/19/2016 Discharge date: 11/24/2016  Time spent: 65 minutes  Recommendations for Outpatient Follow-up:  1. Follow-up with M.D. at residential hospice home.   Discharge Diagnoses:  Principal Problem:   Atrial fibrillation with RVR ([place]) Active Problems:   DVT (deep venous thrombosis) (H[facility])   Chronic diastolic CHF (congestive heart failure) (H[facility])   Stage 3 chronic kidney disease   Abdominal tenderness   Hyperkalemia   Acute encephalopathy   Depression   HCAP (healthcare-associated pneumonia)   Abnormal CT of the abdomen   Palliative care by specialist   Discharge Condition: Stable and improved.  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  Dr Melanie Blake spoke with patient's power of attorney, her niece on the phone. Per her niece, patient was found to be confused since last night. Patient was taking Eliquis for atrial fibrillation, which was discontinued recently because of recent GI bleeding. Per report, pt was found to have tachycardia with heart rates up to 160-200. Her niece states that the patient had fall 3 weeks ago, and had left leg injury. Patient has left leg bruise, swelling and 2 hematomas. She seems to have abdominal pain, but no active nausea, vomiting or diarrhea noted. No active coughing or respiratory distress noted today. No sure if patient has chest pain or shortness of breath. Patient moves all extremities.  ED Course: pt was found to  have Afib with RVR, WBC 13.6, lactate 1.18, INR 1.12, PTT 24, negative troponin, negative urinalysis, BNP 154.8, potassium 5.6 without T-wave peaking on EKG, stable renal function, negative x-ray of the left tibia/fibula for bony fracture. Chest x-ray showed subtle opacity in the right middle lobe. Blood pressure is soft 89/44 which improved to SBP90s. Pt is admitted to stepdown as inpatient.   Hospital Course:  #1 A. fib with RVR CHA2DS2VASC 4 Patient noted on presentation to be in A. fib with RVR. Patient on Cardizem drip with heart rates in the 120s to 130s. Patient's A. fib with RVR likely secondary to discontinuation of her rate limiting medications during last hospitalization. Cardiac enzymes have been negative 2. 2-D echo pending at time of discharge.. TSH at 1.907. Continued on Cardizem drip.  Cardiology has been consulted as patient was noted to have soft blood pressures. Patient was maintained on IV Cardizem and IV Lopressor was added to patient's regimen. Patient was subsequently transitioned to oral Cardizem per cardiology. Patient was also seen in consultation by palliative care and focus was shifted to full comfort measures. Patient will be discharged to a residential hospice home.   #2 chronic diastolic heart failure Stable. Patient seems compensated. Cardiology following.  #3 probable HCAP On admission patient noted to have a leukocytosis was tachycardic and chest x-ray with subtle opacity in the right middle lung lobe. Urine Legionella antigen and urine pneumococcus antigen were negative. Blood cultures obtained with no growth to date. pending. Blood cultures obtained with 1 out of 4 positive for micrococcus species. Patient was initially placed on IV  vancomycin and IV Levaquin however due to patient's agitation and combativeness IV Levaquin was discontinued as well as IV vancomycin. Patient was subsequently placed on IV Azactam. Patient was seen by palliative care and patient was  changed to a full comfort measures. Antibiotics will be discontinued on discharge. Patient will be discharged to a residential hospice home.   #4 acute encephalopathy ?? Etiology. Patient with a baseline dementia. Will be multifactorial secondary to A. fib with RVR, possible pneumonia and electrolyte disturbance. CT head negative for any acute abnormalities. Patient was placed empirically on IV antibiotics possible pneumonia. Patient's atrial fibrillation was also managed. General hospitalization patient became very combative and agitated and seen in consultation by palliative care for goals of care. Patient was placed on Zyprexa at bedtime with some improvement with agitation and combativeness. Patient now on full comfort measures.   #5 hyperkalemia On admission potassium was 5.6. EKG with no peak T waves. Hyperkalemia had resolved by day of discharge.  #6 depression Stable. Resumed  on home regimen of Lexapro and Remeron.   #7 hypothyroidism Continued IV Synthroid during the hospitalization. Patient is now for comfort measures and Synthroid was discontinued on discharge..  #8 dysphagia Per nursing patient choked while trying to drink tea this morning. Continue current dysphagia diet. Patient has been seen by palliative care. Patient made full comfort. Patient be discharged on comfort feeds.  #9 chronic kidney disease stage III Baseline creatinine 1.4-1.8. Renal function improved and better than baseline.  #10 abdominal tenderness Patient with no further abdominal tenderness this morning. Lipase levels within normal limits. CT abdomen and pelvis with a large amount of subcutaneous and soft tissue edema extending from the left lateral chest wall, inferiorly over the left hip. Diffuse soft tissue edema surrounding the left hip musculature and involving the left proximal thigh. No CT evidence of acute intra-abdominal or pelvic pathology.  #11 abnormal CT findings Secondary to fall. CT  abdomen and pelvis with a large amount of subcutaneous and soft tissue edema extending from the left lateral chest wall, inferiorly over the left hip. Diffuse soft tissue edema surrounding the left hip musculature and involving the left proximal thigh. No CT evidence of acute intra-abdominal or pelvic pathology. Patient was seen in consultation by trauma surgery who recommended monitoring for now. Patient is now full comfort measures will be discharged to residential hospice home.  #12 Extensive left lower extremity DVT Patient underwent lower extremity Dopplers which showed an extensive left lower extremity DVT from the left, femoral vein throughout the extremity. Left iliac appear thrombosed. Consulted vascular surgery, Dr. Donzetta Matters who came and assessed the patient and in discussion with Dr. Georganna Skeans of trauma surgery recommended patient be placed on anticoagulation with IV heparin with close monitoring of hematoma and following patient's H&H. If patient's hemoglobin dropped then will need to discontinue IV heparin and place on IVC filter. Patient was started on heparin drip however this was discontinued by cardiology on 11/20/2016, however has been subsequently resumed. Monitored closely for worsening hematoma or bleeding. Patient was continued on IV heparin. Patient was seen in consultation by palliative care and decision was to make patient for comfort. Patient will be discharged to a residential hospice home off anticoagulation.    #12 dehydration Patient hydrated with IV fluids. Patient noted to have poor oral intake. Patient has been transitioned to full comfort measures.   #13 fall Patient with fall 3 weeks ago with left leg injury with no bony fracture on the leg. Patient with  no head injury. CT head and neck negative for any acute abnormalities. Lower extremity Dopplers consistent with extensive large left lower extremity DVT. Patient was placed patient was placed on IV heparin which will  be discontinued on discharge as patient has been changed to full comfort measures.  #13 dementia Patient noted to have a baseline dementia however patient noted to be very combative and agitated the morning of 11/21/2016. Patient's IV antibiotics were subsequently adjusted with discontinuation of Levaquin. Patient was placed on Haldol as needed. Palliative care was also consulted who saw the patient and place patient on Zyprexa at bedtime. Patient was less combative and less agitated. Due to patient's extensive comorbidities palliative care met with the family and patient was subsequently made comfort measures. Patient be discharged to a residential hospice home.       Procedures:  CT abdomen and pelvis 11/20/2016  CT head CT C-spine 11/20/2016  Chest x-ray 11/19/2016  Plain films of Left tib-fib 11/19/2016  Lower extremity Dopplers 11/20/2016  2-D echo 11/21/2016--  Consultations:  Vascular surgery: Dr. Donzetta Matters 11/20/2016  Cardiology: Dr. Radford Pax 11/20/2016  Trauma: Dr. Georganna Skeans 11/20/2016  Palliative care: Dr. Rowe Pavy 11/21/2016  Discharge Exam: Vitals:   11/24/16 0816 11/24/16 1141  BP: 125/64 117/65  Pulse: (!) 117 (!) 139  Resp: 19 18  Temp:      General: NAD Cardiovascular: Irregularly irregular Respiratory: Crackles  Discharge Instructions   Discharge Instructions    Diet - low sodium heart healthy    Complete by:  As directed    Comfort feeds.   Increase activity slowly    Complete by:  As directed      Current Discharge Medication List    START taking these medications   Details  diltiazem (CARDIZEM SR) 120 MG 12 hr capsule Take 1 capsule (120 mg total) by mouth every 12 (twelve) hours. Qty: 60 capsule, Refills: 0    LORazepam (ATIVAN) 0.5 MG tablet Take 1 tablet (0.5 mg total) by mouth every 8 (eight) hours. Qty: 30 tablet, Refills: 0    Morphine Sulfate (MORPHINE CONCENTRATE) 10 MG/0.5ML SOLN concentrated solution Take 0.13-0.25 mLs  (2.6-5 mg total) by mouth every 4 (four) hours as needed for severe pain (dyspnea). Qty: 30 mL, Refills: 0    OLANZapine zydis (ZYPREXA) 5 MG disintegrating tablet Take 1 tablet (5 mg total) by mouth at bedtime. Qty: 30 tablet, Refills: 0    pantoprazole sodium (PROTONIX) 40 mg/20 mL PACK Take 20 mLs (40 mg total) by mouth daily. Qty: 30 each, Refills: 0      CONTINUE these medications which have NOT CHANGED   Details  acetaminophen (TYLENOL) 500 MG tablet Take 2 tablets (1,000 mg total) by mouth every 8 (eight) hours as needed. Qty: 30 tablet, Refills: 0    escitalopram (LEXAPRO) 10 MG tablet Take 10 mg by mouth at bedtime.     mirtazapine (REMERON) 15 MG tablet Take 15 mg by mouth at bedtime.    ondansetron (ZOFRAN) 4 MG tablet Take 4 mg by mouth every 8 (eight) hours as needed for nausea or vomiting.      STOP taking these medications     levothyroxine (SYNTHROID, LEVOTHROID) 50 MCG tablet      traMADol (ULTRAM) 50 MG tablet        Allergies  Allergen Reactions  . Penicillins Swelling    Has patient had a PCN reaction causing immediate rash, facial/tongue/throat swelling, SOB or lightheadedness with hypotension: Yes Has patient had a PCN reaction  causing severe rash involving mucus membranes or skin necrosis: No Has patient had a PCN reaction that required hospitalization unknown Has patient had a PCN reaction occurring within the last 10 years: No If all of the above answers are "NO", then may proceed with Cephalosporin use.  . Sulfa Antibiotics Hives and Swelling   Follow-up Information    [facility] Follow up.            The results of significant diagnostics from this hospitalization (including imaging, microbiology, ancillary and laboratory) are listed below for reference.    Significant Diagnostic Studies: Ct Abdomen Pelvis Wo Contrast  Result Date: 11/20/2016 CLINICAL DATA:  Abdominal pain with left leg swelling EXAM: CT ABDOMEN AND PELVIS  WITHOUT CONTRAST TECHNIQUE: Multidetector CT imaging of the abdomen and pelvis was performed following the standard protocol without IV contrast. COMPARISON:  05/11/2016 FINDINGS: Lower chest: Trace bilateral effusions with streaky atelectasis at the left lung base. Mild cardiomegaly. Moderate hiatal hernia Hepatobiliary: Stable 2 cm cyst in the dome of the right lobe. No biliary dilatation. Gallbladder not well visualized and may be contracted. Pancreas: Atrophic.  No surrounding inflammation. Spleen: Normal in size without focal abnormality. Adrenals/Urinary Tract: Stable 15 mm low-attenuation mass in the right adrenal consistent with adenoma. Left adrenal gland within normal limits. Nonobstructing stone or intrarenal vascular calcification right kidney. Bladder normal Stomach/Bowel: The stomach is nonenlarged. No dilated small bowel. No colon wall thickening. Sigmoid colon diverticula without acute inflammation Vascular/Lymphatic: Aortic atherosclerosis. No enlarged abdominal or pelvic lymph nodes. Reproductive: Uterus and bilateral adnexa are unremarkable. Other: No free air or free fluid. Large amount of subcutaneous edema within the left lateral posterior subcutaneous fat that extends inferiorly along the left lateral lower chest wall and abdominal wall. This continues inferiorly over the left hip and pelvis. There is diffuse edema surrounding the left hip muscles with moderate diffuse edema in the proximal left thigh. Musculoskeletal: Degenerative changes of the spine. No acute or suspicious bone lesion. IMPRESSION: 1. Large amount of subcutaneous and soft tissue edema extending from the left lateral chest wall, inferiorly over the left hip. There is diffuse soft tissue edema surrounding the left hip musculature and involving the proximal left thigh. 2. There is no CT evidence for acute intra-abdominal or pelvic pathology 3. Nonobstructing stone or intrarenal vascular calcification right kidney. 4. Sigmoid  colon diverticular disease without acute inflammation 5. Moderate hiatal hernia 6. Trace bilateral effusions with linear atelectasis at the left base. 7. 1.5 cm right adrenal gland adenoma Electronically Signed   By: Donavan Foil M.D.   On: 11/20/2016 02:16   Dg Chest 2 View  Result Date: 10/27/2016 CLINICAL DATA:  Dizziness. EXAM: CHEST  2 VIEW COMPARISON:  06/13/2016 FINDINGS: The heart size is enlarged. Large hiatal hernia is again noted. The lungs appear hyperinflated with chronic interstitial coarsening. No superimposed airspace consolidation identified. Compression fractures within the lower thoracic spine with associated kyphosis deformity noted. IMPRESSION: 1. No acute cardiopulmonary abnormalities. 2. Cardiac enlargement 3. Hiatal hernia 4. Thoracic compression deformities. Electronically Signed   By: Kerby Moors M.D.   On: 10/27/2016 20:24   Dg Tibia/fibula Left  Result Date: 11/19/2016 CLINICAL DATA:  Pain and swelling EXAM: LEFT TIBIA AND FIBULA - 2 VIEW COMPARISON:  None. FINDINGS: Diffuse subcutaneous edema is identified. No fracture or dislocation is seen. Mild osteopenia is seen. IMPRESSION: Soft tissue swelling without acute bony abnormality. Electronically Signed   By: Inez Catalina M.D.   On: 11/19/2016 19:09  Dg Tibia/fibula Right  Result Date: 10/27/2016 CLINICAL DATA:  Right leg pain after mechanical fall last evening EXAM: RIGHT TIBIA AND FIBULA - 2 VIEW COMPARISON:  None. FINDINGS: There is no evidence of fracture or other focal bone lesions. Joint spaces at the knee and ankle are intact. Popliteal and tibial and branch vessel arteriosclerosis. Mild soft tissue induration about the knee and right leg without focal soft tissue mass or fluid collections. IMPRESSION: Mild subcutaneous soft tissue induration of the right knee and leg. No acute osseous abnormality. Electronically Signed   By: Ashley Royalty M.D.   On: 10/27/2016 20:18   Ct Head Wo Contrast  Result Date:  11/20/2016 CLINICAL DATA:  Altered level of consciousness following a fall 3 weeks ago EXAM: CT HEAD WITHOUT CONTRAST CT CERVICAL SPINE WITHOUT CONTRAST TECHNIQUE: Multidetector CT imaging of the head and cervical spine was performed following the standard protocol without intravenous contrast. Multiplanar CT image reconstructions of the cervical spine were also generated. COMPARISON:  10/27/2016 FINDINGS: CT HEAD FINDINGS Brain: No acute territorial infarction, hemorrhage or intracranial mass. Moderate atrophy. Old lacunar infarct left basal ganglia. Mild to moderate periventricular small vessel ischemic changes. Stable ventricle size Vascular: No hyperdense vessels.  Carotid artery calcification Skull: No fracture or suspicious bone lesion Sinuses/Orbits: No acute finding. Other: None CT CERVICAL SPINE FINDINGS Alignment: Exaggerated lordosis. Minimal retrolisthesis of C3 on C4 and C4 on C5. Facets maintain alignment. Skull base and vertebrae: Craniovertebral junction is intact. No fracture. Soft tissues and spinal canal: No prevertebral fluid or swelling. No visible canal hematoma. Disc levels: Moderate degenerative disc changes at C4-C5. Multilevel facet arthropathy results in multilevel foraminal narrowing. Upper chest: No acute abnormality at the lung apices. Carotid artery calcification. Other: None IMPRESSION: 1. No CT evidence for acute intracranial abnormality. Atrophy and small vessel ischemic changes 2. Exaggerated cervical lordosis.  No fracture visualized. Electronically Signed   By: Donavan Foil M.D.   On: 11/20/2016 02:00   Ct Head Wo Contrast  Result Date: 10/27/2016 CLINICAL DATA:  [place] during the night.  Dizziness. EXAM: CT HEAD WITHOUT CONTRAST TECHNIQUE: Contiguous axial images were obtained from the base of the skull through the vertex without intravenous contrast. COMPARISON:  06/13/2016 FINDINGS: Brain: There is no evidence for acute hemorrhage, hydrocephalus, mass lesion, or abnormal  extra-axial fluid collection. No definite CT evidence for acute infarction. Diffuse loss of parenchymal volume is consistent with atrophy. Patchy low attenuation in the deep hemispheric and periventricular white matter is nonspecific, but likely reflects chronic microvascular ischemic demyelination. Lacunar infarct noted in the left basal ganglia. Vascular: No hyperdense vessel or unexpected calcification. Skull: No evidence for fracture. No worrisome lytic or sclerotic lesion. Visualized portions of the globes and intraorbital fat are unremarkable. Sinuses/Orbits: The visualized paranasal sinuses and mastoid air cells are clear. Other: None. IMPRESSION: 1. Stable.  No acute intracranial abnormality. 2. Atrophy with chronic small vessel white matter ischemic disease. Electronically Signed   By: Misty Stanley M.D.   On: 10/27/2016 20:11   Ct Cervical Spine Wo Contrast  Result Date: 11/20/2016 CLINICAL DATA:  Altered level of consciousness following a fall 3 weeks ago EXAM: CT HEAD WITHOUT CONTRAST CT CERVICAL SPINE WITHOUT CONTRAST TECHNIQUE: Multidetector CT imaging of the head and cervical spine was performed following the standard protocol without intravenous contrast. Multiplanar CT image reconstructions of the cervical spine were also generated. COMPARISON:  10/27/2016 FINDINGS: CT HEAD FINDINGS Brain: No acute territorial infarction, hemorrhage or intracranial mass. Moderate atrophy. Old lacunar  infarct left basal ganglia. Mild to moderate periventricular small vessel ischemic changes. Stable ventricle size Vascular: No hyperdense vessels.  Carotid artery calcification Skull: No fracture or suspicious bone lesion Sinuses/Orbits: No acute finding. Other: None CT CERVICAL SPINE FINDINGS Alignment: Exaggerated lordosis. Minimal retrolisthesis of C3 on C4 and C4 on C5. Facets maintain alignment. Skull base and vertebrae: Craniovertebral junction is intact. No fracture. Soft tissues and spinal canal: No  prevertebral fluid or swelling. No visible canal hematoma. Disc levels: Moderate degenerative disc changes at C4-C5. Multilevel facet arthropathy results in multilevel foraminal narrowing. Upper chest: No acute abnormality at the lung apices. Carotid artery calcification. Other: None IMPRESSION: 1. No CT evidence for acute intracranial abnormality. Atrophy and small vessel ischemic changes 2. Exaggerated cervical lordosis.  No fracture visualized. Electronically Signed   By: Donavan Foil M.D.   On: 11/20/2016 02:00   Dg Chest Port 1 View  Result Date: 11/19/2016 CLINICAL DATA:  Dyspnea and dementia EXAM: PORTABLE CHEST 1 VIEW COMPARISON:  10/27/2016 FINDINGS: Cardiomegaly is stable. Tortuous atherosclerotic aorta is likewise unchanged. The patient's chin obscures the apices more so on the right. Subtle and airspace opacity is noted in the right mid lung since prior exam, some of which is due to overlapping ribs and scapula but findings are suspicious for an evolving pneumonia. Left basilar scarring is noted. Clinical correlation is suggested. Old left-sided lower rib fractures are stable. IMPRESSION: 1. Stable cardiomegaly with aortic atherosclerosis. 2. Subtle opacity in the right mid lung is suspicious for an evolving pneumonia although some of this appearance is due to overlapping ribs and scapula. Electronically Signed   By: Ashley Royalty M.D.   On: 11/19/2016 20:45   Dg Knee Complete 4 Views Right  Result Date: 10/27/2016 CLINICAL DATA:  Pain after mechanical fall EXAM: RIGHT KNEE - COMPLETE 4+ VIEW COMPARISON:  None. FINDINGS: Mild joint space narrowing of the femorotibial compartment. Arteriosclerosis noted along the course of the popliteal and tibial arteries and their branches. There is mild soft tissue edema about the right leg. No abnormal soft tissue mass or mineralization. Bones are osteopenic in appearance without acute fracture or malalignment. No bone destruction is seen. Ankle mortise is  maintained. IMPRESSION: No acute osseous abnormality of the right tibia and fibula. Mild soft tissue edema of the leg. Mild joint space narrowing of the femorotibial compartment. Electronically Signed   By: Ashley Royalty M.D.   On: 10/27/2016 23:44    Microbiology: Recent Results (from the past 240 hour(s))  Culture, blood (Routine X 2) w Reflex to ID Panel     Status: Abnormal   Collection Time: 11/19/16  9:50 PM  Result Value Ref Range Status   Specimen Description BLOOD LEFT ANTECUBITAL  Final   Special Requests   Final    BOTTLES DRAWN AEROBIC AND ANAEROBIC Blood Culture adequate volume   Culture  Setup Time   Final    GRAM POSITIVE COCCI IN CLUSTERS AEROBIC BOTTLE ONLY CRITICAL RESULT CALLED TO, READ BACK BY AND VERIFIED WITH: T. DANG, RPHARMD AT 1550 ON 11/21/16 BY C. JESSUP, MLT.    Culture (A)  Final    MICROCOCCUS SPECIES Standardized susceptibility testing for this organism is not available.    Report Status 11/22/2016 FINAL  Final  Blood Culture ID Panel (Reflexed)     Status: None   Collection Time: 11/19/16  9:50 PM  Result Value Ref Range Status   Enterococcus species NOT DETECTED NOT DETECTED Final   Listeria monocytogenes NOT DETECTED  NOT DETECTED Final   Staphylococcus species NOT DETECTED NOT DETECTED Final   Staphylococcus aureus NOT DETECTED NOT DETECTED Final   Streptococcus species NOT DETECTED NOT DETECTED Final   Streptococcus agalactiae NOT DETECTED NOT DETECTED Final   Streptococcus pneumoniae NOT DETECTED NOT DETECTED Final   Streptococcus pyogenes NOT DETECTED NOT DETECTED Final   Acinetobacter baumannii NOT DETECTED NOT DETECTED Final   Enterobacteriaceae species NOT DETECTED NOT DETECTED Final   Enterobacter cloacae complex NOT DETECTED NOT DETECTED Final   Escherichia coli NOT DETECTED NOT DETECTED Final   Klebsiella oxytoca NOT DETECTED NOT DETECTED Final   Klebsiella pneumoniae NOT DETECTED NOT DETECTED Final   Proteus species NOT DETECTED NOT  DETECTED Final   Serratia marcescens NOT DETECTED NOT DETECTED Final   Haemophilus influenzae NOT DETECTED NOT DETECTED Final   Neisseria meningitidis NOT DETECTED NOT DETECTED Final   Pseudomonas aeruginosa NOT DETECTED NOT DETECTED Final   Candida albicans NOT DETECTED NOT DETECTED Final   Candida glabrata NOT DETECTED NOT DETECTED Final   Candida krusei NOT DETECTED NOT DETECTED Final   Candida parapsilosis NOT DETECTED NOT DETECTED Final   Candida tropicalis NOT DETECTED NOT DETECTED Final  Culture, blood (Routine X 2) w Reflex to ID Panel     Status: None   Collection Time: 11/19/16  9:55 PM  Result Value Ref Range Status   Specimen Description BLOOD  Final   Special Requests   Final    BOTTLES DRAWN AEROBIC ONLY Blood Culture adequate volume   Culture NO GROWTH 5 DAYS  Final   Report Status 11/24/2016 FINAL  Final     Labs: Basic Metabolic Panel:  Recent Labs Lab 11/19/16 1940 11/20/16 1244 11/22/16 0435 11/23/16 0232  NA 135 135 137 138  K 5.6* 4.5 4.0 3.5  CL 99* 101 103 105  CO2 23 26 17* 21*  GLUCOSE 102* 102* 80 94  BUN _0 CREATININE 1.67* 1.27* 0.96 0.88  CALCIUM 9.2 8.5* 8.5* 8.3*  MG  --  2.2  --   --   PHOS  --   --   --  2.1*   Liver Function Tests:  Recent Labs Lab 11/19/16 1940 11/23/16 0232  AST 27  --   ALT 12*  --   ALKPHOS 109  --   BILITOT 1.0  --   PROT 6.9  --   ALBUMIN 3.0* 2.4*    Recent Labs Lab 11/20/16 0011  LIPASE 23    Recent Labs Lab 11/20/16 1244  AMMONIA 17   CBC:  Recent Labs Lab 11/19/16 1940 11/20/16 1244 11/22/16 0435 11/23/16 0232 11/24/16 0433  WBC 13.6* 8.2 10.2 9.6 9.4  NEUTROABS 12.0* 6.7  --   --   --   HGB 8.6* 8.0* 8.3* 7.9* 8.0*  HCT 29.2* 26.8*  26.3* 28.1* 26.0* 26.8*  MCV 81.8 80.5 80.5 80.0 79.8  PLT 324 260 319 329 342   Cardiac Enzymes:  Recent Labs Lab 11/20/16 0202 11/20/16 0707 11/20/16 1244 11/20/16 1604  CKTOTAL  --   --   --  20*  TROPONINI <0.03 <0.03 <0.03   --    BNP: BNP (last 3 results)  Recent Labs  10/27/16 1843 11/19/16 1940  BNP 427.7* 154.8*    ProBNP (last 3 results) No results for input(s): PROBNP in the last 8760 hours.  CBG: No results for input(s): GLUCAP in the last 168 hours.     SignedIrine Seal MD.  [facility]  11/24/2016, 2:46 PM

## 2016-11-24 NOTE — Progress Notes (Signed)
Discussed SLP order with Palliative medicine team who advised to defer evaluation at this time. Order d/c'd.  Ferdinand LangoLeah Anna Livers MA, CCC-SLP [phone]

## 2016-12-06 IMAGING — DX DG CHEST 2V
2 series · 2 of 2 positions shown · non-contrast
Comparison: 05/21/2016

CLINICAL DATA: Fell from standing position 2 days ago, history
chronic diastolic heart failure, atrial fibrillation

EXAM:
CHEST  2 VIEW

[w chest lat]
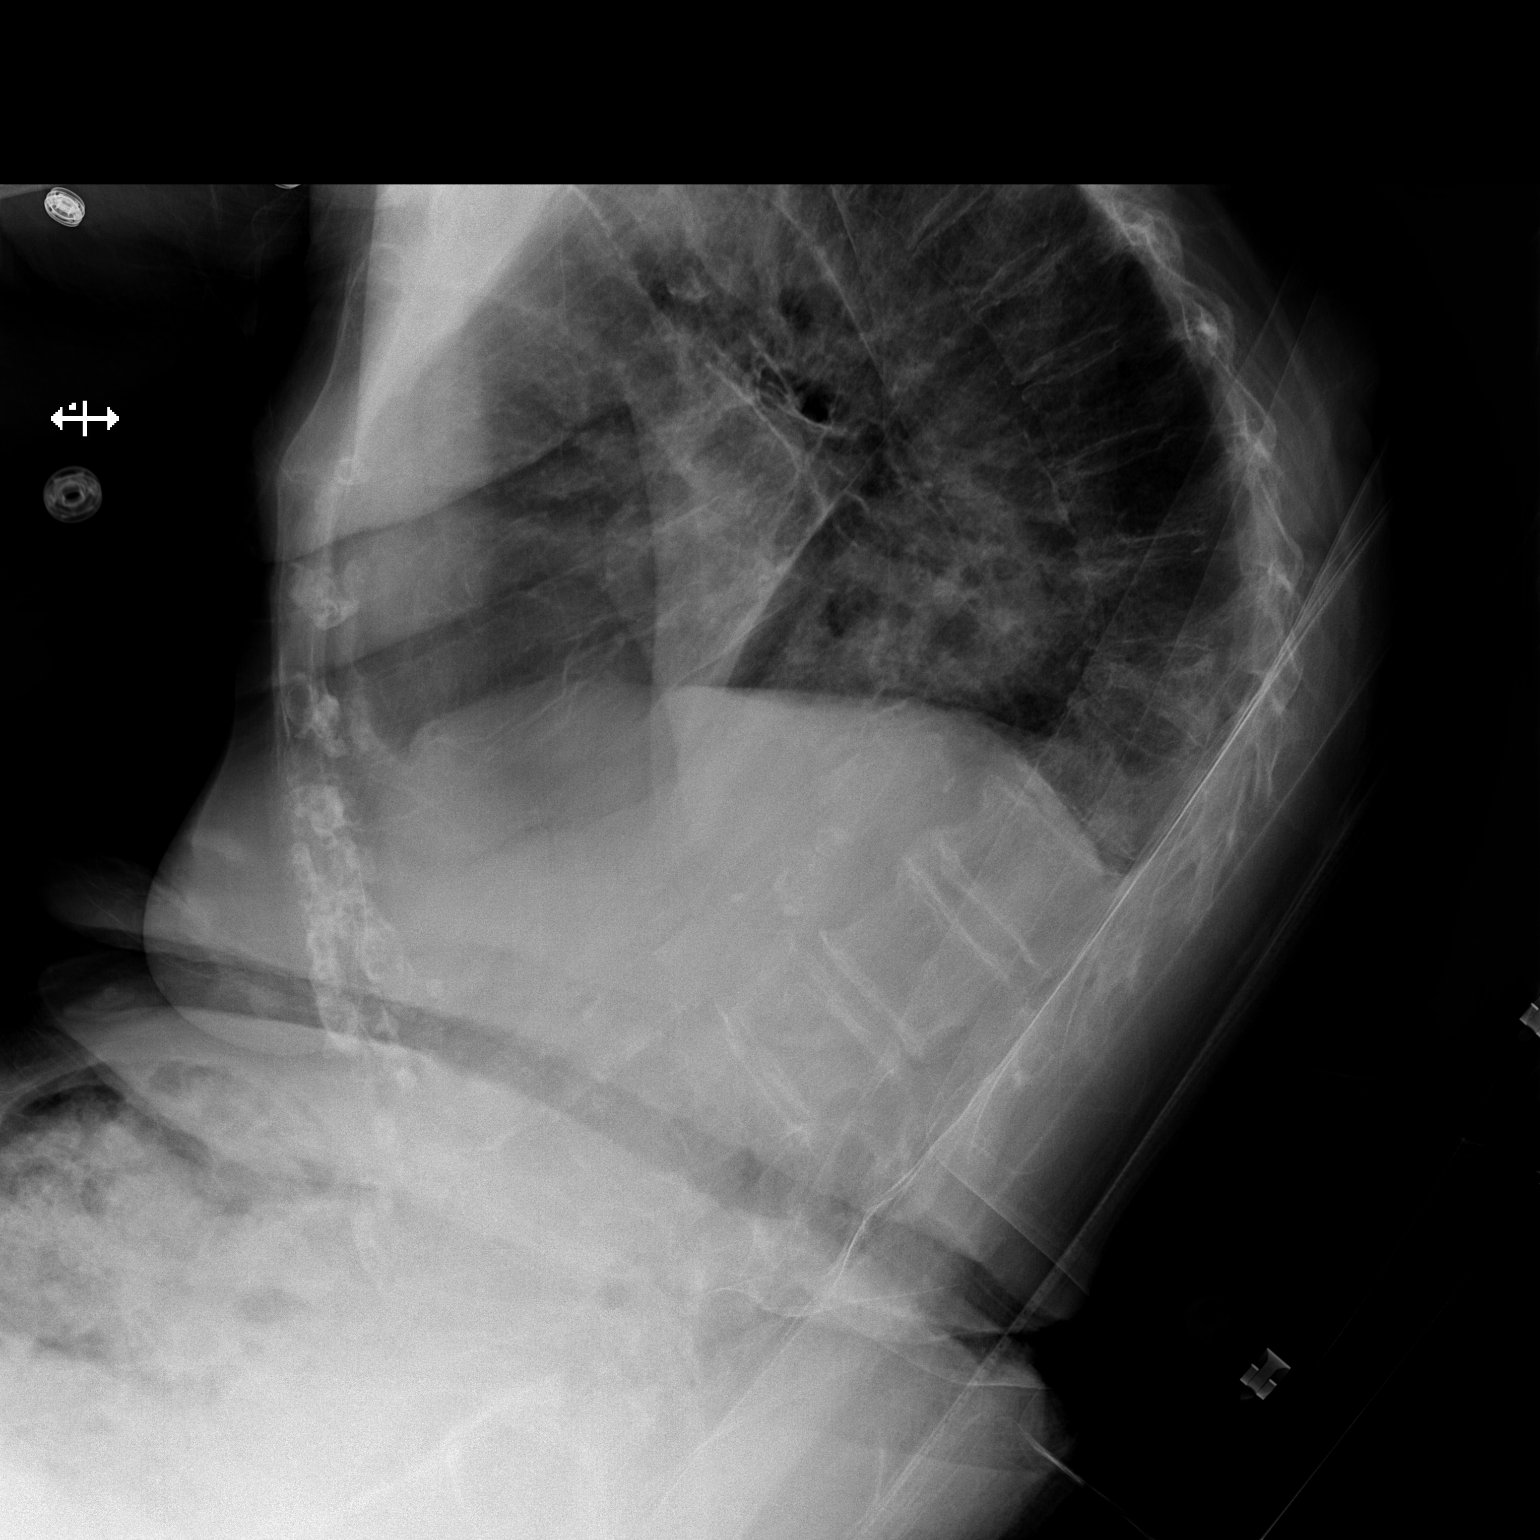

[x chest ap]
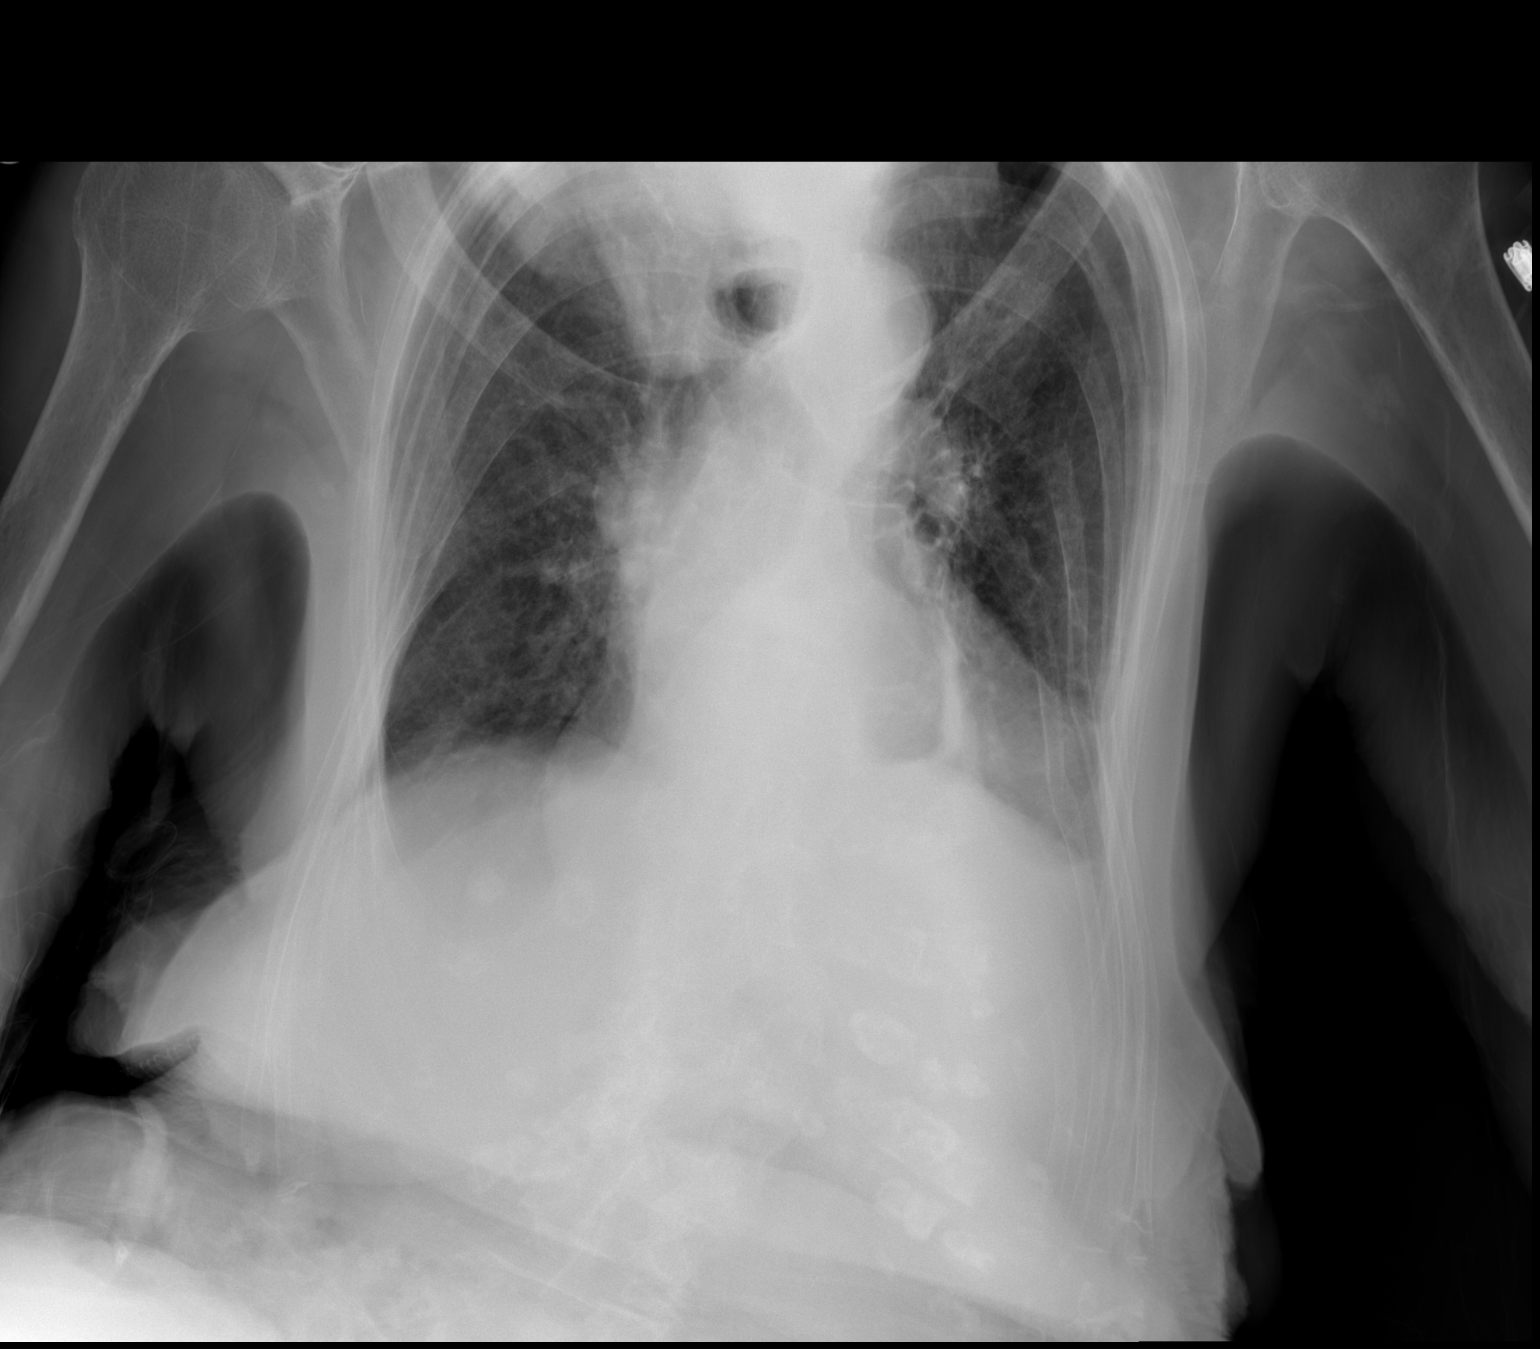

[2 of 2 positions shown; findings below may reference images not displayed]

FINDINGS: Kyphotic positioning.

Enlargement of cardiac silhouette.

Atherosclerotic calcification aorta.

Mediastinal contours and pulmonary vascularity normal.

Large hiatal hernia.

RIGHT basilar atelectasis and mild central bronchitic changes.

No acute infiltrate, pleural effusion or pneumothorax.

Bones diffusely demineralized with chronic compression fracture of a
lower thoracic vertebra grossly unchanged.
IMPRESSION: Enlargement of cardiac silhouette.

Large hiatal hernia.

Bronchitic changes with RIGHT basilar atelectasis.

Chronic lower thoracic compression fracture.

## 2016-12-19 DEATH — deceased
